# Patient Record
Sex: Female | Born: 1940
Health system: Southern US, Community
[De-identification: ages and names within clinical notes are randomized; demographics above are authoritative.]

## PROBLEM LIST (undated history)

## (undated) DIAGNOSIS — R079 Chest pain, unspecified: Secondary | ICD-10-CM

## (undated) DIAGNOSIS — M1990 Unspecified osteoarthritis, unspecified site: Secondary | ICD-10-CM

## (undated) DIAGNOSIS — G610 Guillain-Barre syndrome: Secondary | ICD-10-CM

## (undated) DIAGNOSIS — Z9889 Other specified postprocedural states: Secondary | ICD-10-CM

## (undated) DIAGNOSIS — I10 Essential (primary) hypertension: Secondary | ICD-10-CM

## (undated) DIAGNOSIS — C801 Malignant (primary) neoplasm, unspecified: Secondary | ICD-10-CM

## (undated) DIAGNOSIS — M4800 Spinal stenosis, site unspecified: Secondary | ICD-10-CM

## (undated) DIAGNOSIS — F419 Anxiety disorder, unspecified: Secondary | ICD-10-CM

## (undated) DIAGNOSIS — K219 Gastro-esophageal reflux disease without esophagitis: Secondary | ICD-10-CM

## (undated) HISTORY — DX: Chest pain, unspecified: R07.9

## (undated) HISTORY — PX: LUMBAR DISC SURGERY: SHX700

## (undated) HISTORY — PX: BACK SURGERY: SHX140

## (undated) HISTORY — PX: ABDOMINAL HYSTERECTOMY: SHX81

## (undated) HISTORY — PX: COLONOSCOPY: SHX174

---

## 1978-05-07 DIAGNOSIS — G610 Guillain-Barre syndrome: Secondary | ICD-10-CM

## 1978-05-07 HISTORY — DX: Guillain-Barre syndrome: G61.0

## 2004-03-17 ENCOUNTER — Emergency Department: Payer: Self-pay | Admitting: Emergency Medicine

## 2004-05-07 HISTORY — PX: AUGMENTATION MAMMAPLASTY: SUR837

## 2004-09-26 ENCOUNTER — Ambulatory Visit: Payer: Self-pay | Admitting: Internal Medicine

## 2008-05-06 ENCOUNTER — Emergency Department: Payer: Self-pay | Admitting: Emergency Medicine

## 2009-08-27 ENCOUNTER — Ambulatory Visit: Payer: Self-pay | Admitting: Internal Medicine

## 2010-08-08 ENCOUNTER — Ambulatory Visit: Payer: Self-pay | Admitting: Internal Medicine

## 2011-08-22 ENCOUNTER — Ambulatory Visit: Payer: Self-pay | Admitting: Internal Medicine

## 2012-05-24 ENCOUNTER — Ambulatory Visit: Payer: Self-pay | Admitting: Physical Medicine and Rehabilitation

## 2012-06-14 ENCOUNTER — Ambulatory Visit: Payer: Self-pay | Admitting: Physical Medicine and Rehabilitation

## 2012-06-14 LAB — CREATININE, SERUM
Creatinine: 0.66 mg/dL (ref 0.60–1.30)
EGFR (Non-African Amer.): >60

## 2012-10-01 ENCOUNTER — Ambulatory Visit: Payer: Self-pay | Admitting: Internal Medicine

## 2013-10-02 ENCOUNTER — Ambulatory Visit: Payer: Self-pay | Admitting: Internal Medicine

## 2013-11-14 DIAGNOSIS — M8580 Other specified disorders of bone density and structure, unspecified site: Secondary | ICD-10-CM | POA: Insufficient documentation

## 2013-11-14 DIAGNOSIS — M1990 Unspecified osteoarthritis, unspecified site: Secondary | ICD-10-CM | POA: Insufficient documentation

## 2013-11-14 DIAGNOSIS — Z780 Asymptomatic menopausal state: Secondary | ICD-10-CM | POA: Insufficient documentation

## 2013-11-14 DIAGNOSIS — R609 Edema, unspecified: Secondary | ICD-10-CM | POA: Insufficient documentation

## 2013-11-14 DIAGNOSIS — G992 Myelopathy in diseases classified elsewhere: Secondary | ICD-10-CM | POA: Insufficient documentation

## 2013-12-13 DIAGNOSIS — G603 Idiopathic progressive neuropathy: Secondary | ICD-10-CM | POA: Insufficient documentation

## 2014-05-07 HISTORY — PX: CERVICAL SPINE SURGERY: SHX589

## 2014-06-23 ENCOUNTER — Ambulatory Visit: Payer: Self-pay | Admitting: Ophthalmology

## 2014-07-14 ENCOUNTER — Ambulatory Visit: Payer: Self-pay | Admitting: Ophthalmology

## 2014-08-20 ENCOUNTER — Other Ambulatory Visit: Payer: Self-pay | Admitting: Internal Medicine

## 2014-08-20 DIAGNOSIS — N6310 Unspecified lump in the right breast, unspecified quadrant: Secondary | ICD-10-CM

## 2014-10-05 ENCOUNTER — Other Ambulatory Visit: Payer: Self-pay | Admitting: Internal Medicine

## 2014-10-05 ENCOUNTER — Ambulatory Visit
Admission: RE | Admit: 2014-10-05 | Discharge: 2014-10-05 | Disposition: A | Discharge status: Home / Self Care | Payer: PPO | Source: Ambulatory Visit | Attending: Internal Medicine | Admitting: Internal Medicine

## 2014-10-05 DIAGNOSIS — Z9882 Breast implant status: Secondary | ICD-10-CM | POA: Diagnosis not present

## 2014-10-05 DIAGNOSIS — R922 Inconclusive mammogram: Secondary | ICD-10-CM | POA: Insufficient documentation

## 2014-10-05 DIAGNOSIS — Z1231 Encounter for screening mammogram for malignant neoplasm of breast: Secondary | ICD-10-CM | POA: Insufficient documentation

## 2014-10-05 DIAGNOSIS — N6310 Unspecified lump in the right breast, unspecified quadrant: Secondary | ICD-10-CM

## 2015-05-23 DIAGNOSIS — G603 Idiopathic progressive neuropathy: Secondary | ICD-10-CM | POA: Diagnosis not present

## 2015-05-30 DIAGNOSIS — I10 Essential (primary) hypertension: Secondary | ICD-10-CM | POA: Diagnosis not present

## 2015-05-30 DIAGNOSIS — R609 Edema, unspecified: Secondary | ICD-10-CM | POA: Diagnosis not present

## 2015-05-30 DIAGNOSIS — Z0000 Encounter for general adult medical examination without abnormal findings: Secondary | ICD-10-CM | POA: Insufficient documentation

## 2015-05-30 DIAGNOSIS — M19049 Primary osteoarthritis, unspecified hand: Secondary | ICD-10-CM | POA: Diagnosis not present

## 2015-06-06 DIAGNOSIS — Z0000 Encounter for general adult medical examination without abnormal findings: Secondary | ICD-10-CM | POA: Diagnosis not present

## 2015-06-17 DIAGNOSIS — L570 Actinic keratosis: Secondary | ICD-10-CM | POA: Diagnosis not present

## 2015-06-17 DIAGNOSIS — L821 Other seborrheic keratosis: Secondary | ICD-10-CM | POA: Diagnosis not present

## 2015-06-17 DIAGNOSIS — Z85828 Personal history of other malignant neoplasm of skin: Secondary | ICD-10-CM | POA: Diagnosis not present

## 2015-06-17 DIAGNOSIS — D225 Melanocytic nevi of trunk: Secondary | ICD-10-CM | POA: Diagnosis not present

## 2015-07-06 DIAGNOSIS — L570 Actinic keratosis: Secondary | ICD-10-CM | POA: Diagnosis not present

## 2015-07-06 DIAGNOSIS — X32XXXA Exposure to sunlight, initial encounter: Secondary | ICD-10-CM | POA: Diagnosis not present

## 2015-07-12 DIAGNOSIS — M79674 Pain in right toe(s): Secondary | ICD-10-CM | POA: Diagnosis not present

## 2015-07-12 DIAGNOSIS — L600 Ingrowing nail: Secondary | ICD-10-CM | POA: Diagnosis not present

## 2015-07-12 DIAGNOSIS — M79675 Pain in left toe(s): Secondary | ICD-10-CM | POA: Diagnosis not present

## 2015-07-19 DIAGNOSIS — R0989 Other specified symptoms and signs involving the circulatory and respiratory systems: Secondary | ICD-10-CM | POA: Diagnosis not present

## 2015-07-19 DIAGNOSIS — R05 Cough: Secondary | ICD-10-CM | POA: Diagnosis not present

## 2015-07-19 DIAGNOSIS — J40 Bronchitis, not specified as acute or chronic: Secondary | ICD-10-CM | POA: Diagnosis not present

## 2015-07-19 DIAGNOSIS — K219 Gastro-esophageal reflux disease without esophagitis: Secondary | ICD-10-CM | POA: Diagnosis not present

## 2015-08-25 ENCOUNTER — Other Ambulatory Visit: Payer: Self-pay | Admitting: Internal Medicine

## 2015-08-25 DIAGNOSIS — Z1231 Encounter for screening mammogram for malignant neoplasm of breast: Secondary | ICD-10-CM

## 2015-08-29 ENCOUNTER — Encounter: Payer: Self-pay | Admitting: Emergency Medicine

## 2015-08-29 ENCOUNTER — Emergency Department
Admission: EM | Admit: 2015-08-29 | Discharge: 2015-08-29 | Disposition: A | Discharge status: Home / Self Care | Payer: PPO | Attending: Emergency Medicine | Admitting: Emergency Medicine

## 2015-08-29 DIAGNOSIS — G610 Guillain-Barre syndrome: Secondary | ICD-10-CM | POA: Insufficient documentation

## 2015-08-29 DIAGNOSIS — Z79899 Other long term (current) drug therapy: Secondary | ICD-10-CM | POA: Insufficient documentation

## 2015-08-29 DIAGNOSIS — I10 Essential (primary) hypertension: Secondary | ICD-10-CM | POA: Insufficient documentation

## 2015-08-29 DIAGNOSIS — M4800 Spinal stenosis, site unspecified: Secondary | ICD-10-CM | POA: Diagnosis not present

## 2015-08-29 HISTORY — DX: Guillain-Barre syndrome: G61.0

## 2015-08-29 HISTORY — DX: Essential (primary) hypertension: I10

## 2015-08-29 HISTORY — DX: Spinal stenosis, site unspecified: M48.00

## 2015-08-29 LAB — BASIC METABOLIC PANEL
ANION GAP: 10 (ref 5–15)
BUN: 16 mg/dL (ref 6–20)
CHLORIDE: 104 mmol/L (ref 101–111)
CO2: 24 mmol/L (ref 22–32)
Calcium: 9.6 mg/dL (ref 8.9–10.3)
Creatinine, Ser: 0.61 mg/dL (ref 0.44–1.00)
GFR calc non Af Amer: >60 mL/min (ref >60)
Glucose, Bld: 105 mg/dL — ABNORMAL HIGH (ref 65–99)
POTASSIUM: 3.7 mmol/L (ref 3.5–5.1)
SODIUM: 138 mmol/L (ref 135–145)

## 2015-08-29 NOTE — ED Notes (Signed)
Verbal report given to [place], [place]

## 2015-08-29 NOTE — Discharge Instructions (Signed)

## 2015-08-29 NOTE — ED Notes (Signed)
Patient reports noticed symptoms around 9pm.  States felt face was flushed and that her cheeks were rosy so she checked her blood pressure and noted it to be up, but thought it would probably go down after she had gotten some sleep.  Patient reports she attempted to sleep, but could not because she felt "jittery" and she rechecked her blood pressure and it was higher.  Patient had been instructed by her physician that she could take 1/2 of her regular blood pressure medicine if this happened.  Patient reports that after a while she took her pressure again and it was still up so she took the other half of her pill.  Patient also reports having a slight headache described as pressure and rated 1/10.  Patient denies chest pain, shortness of breath, dizziness or other symptoms.

## 2015-08-29 NOTE — ED Notes (Addendum)
Pt says around 9pm last night she was feeling shaky so she checked her blood pressure; pressure was up at that time and seems to be getting higher; last check at 0030 was 193/92; currently 191/86 in triage; c/o dull headache; pt takes 5mg  Amlodipine daily; was told by her MD that if she had trouble with her BP she could take another 1/2 tablet; has done that twice in the last 3 hours

## 2015-08-29 NOTE — ED Provider Notes (Signed)
[facility] Emergency Department Provider Note  ____________________________________________  Time seen: Approximately 1:48 AM  I have reviewed the triage vital signs and the nursing notes.   HISTORY  Chief Complaint Hypertension and Headache    HPI [name] Woods is a 75 y.o. female with essential hypertension and several episodes in the past of elevated blood pressure.  She presents tonight by private vehicle for evaluation of hypertension.  She states that she felt flushed earlier tonight and that is often a sign that her blood pressure was up.  When she checked it her blood pressure is been in the [id number] systolic.  She is also had a very mild, 1 out of 10 headache which is common for her as well as well as a generalized feeling of "jittery".  She denies any visual changes, urinary habit changes, chest pain, shortness of breath, fever/chills, abdominal pain, nausea, vomiting, diarrhea.  Says not had any numbness nor weakness in any of her extremities and no difficulty walking.  She describes the hypertension as severe but notes that it is are not getting better since coming to the emergency department.  She did take 2 extra doses of amlodipine 2.5 mg (a total of an extra 5 mg) as per the instructions from her PCP if this were to happen again.   Past Medical History  Diagnosis Date  . Hypertension   . Guillain Barr syndrome ([place])   . Spinal stenosis     There are no active problems to display for this patient.   Past Surgical History  Procedure Laterality Date  . Augmentation mammaplasty  2006  . Back surgery    . Abdominal hysterectomy      Current Outpatient Rx  Name  Route  Sig  Dispense  Refill  . amLODipine (NORVASC) 2.5 MG tablet   Oral   Take 2.5 mg by mouth daily.         Marland Kitchen estradiol (ESTRACE) 1 MG tablet   Oral   Take 1 mg by mouth daily.         . furosemide (LASIX) 20 MG tablet   Oral   Take 20 mg by mouth daily as  needed for edema.          Marland Kitchen losartan (COZAAR) 100 MG tablet   Oral   Take 100 mg by mouth daily.         Marland Kitchen omeprazole (PRILOSEC) 40 MG capsule   Oral   Take 40 mg by mouth daily.           Allergies Review of patient's allergies indicates no known allergies.  History reviewed. No pertinent family history.  Social History Social History  Substance Use Topics  . Smoking status: Never Smoker   . Smokeless tobacco: None  . Alcohol Use: Yes     Comment: occasional    Review of Systems Constitutional: No fever/chills.  Cheeks feel flushed. Eyes: No visual changes. ENT: No sore throat. Cardiovascular: Denies chest pain. Respiratory: Denies shortness of breath. Gastrointestinal: No abdominal pain.  No nausea, no vomiting.  No diarrhea.  No constipation. Genitourinary: Negative for dysuria. Musculoskeletal: Negative for back pain. Skin: Negative for rash. Neurological: Negative for headaches, focal weakness or numbness.  10-point ROS otherwise negative.  ____________________________________________   PHYSICAL EXAM:  VITAL SIGNS: ED Triage Vitals  Enc Vitals Group     BP 08/29/15 0059 191/86 mmHg     Pulse Rate 08/29/15 0059 86     Resp 08/29/15 0059  18     Temp 08/29/15 0059 98.7 F (37.1 C)     Temp Source 08/29/15 0059 Oral     SpO2 08/29/15 0059 97 %     Weight 08/29/15 0059 142 lb (64.411 kg)     Height 08/29/15 0059 5\' 4"  (1.626 m)     Head Cir --      Peak Flow --      Pain Score 08/29/15 0059 1     Pain Loc --      Pain Edu? --      Excl. in [place]? --     Constitutional: Alert and oriented. Well appearing and in no acute distress. Eyes: Conjunctivae are normal. PERRL. EOMI. Head: Atraumatic. Nose: No congestion/rhinnorhea. Mouth/Throat: Mucous membranes are moist.  Oropharynx non-erythematous. Neck: No stridor.  No meningeal signs.   Cardiovascular: Normal rate, regular rhythm. Good peripheral circulation. Grossly normal heart sounds.    Respiratory: Normal respiratory effort.  No retractions. Lungs CTAB. Gastrointestinal: Soft and nontender. No distention.  Musculoskeletal: No lower extremity tenderness nor edema. No gross deformities of extremities. Neurologic:  Normal speech and language. No gross focal neurologic deficits are appreciated.  Skin:  Skin is warm, dry and intact. No rash noted. Psychiatric: Mood and affect are normal. Speech and behavior are normal.  ____________________________________________   LABS (all labs ordered are listed, but only abnormal results are displayed)  Labs Reviewed  BASIC METABOLIC PANEL - Abnormal; Notable for the following:    Glucose, Bld 105 (*)    All other components within normal limits   ____________________________________________  EKG  ED ECG REPORT I, Emmaus Brandi, the attending physician, personally viewed and interpreted this ECG.   Date: 08/29/2015  EKG Time: 01:24  Rate: 77  Rhythm: normal sinus rhythm  Axis: Normal  Intervals:Normal  ST&T Change: Non-specific ST segment / T-wave changes, but no evidence of acute ischemia.  ____________________________________________  RADIOLOGY   No results found.  ____________________________________________   PROCEDURES  Procedure(s) performed: None  Critical Care performed: No ____________________________________________   INITIAL IMPRESSION / ASSESSMENT AND PLAN / ED COURSE  Pertinent labs & imaging results that were available during my care of the patient were reviewed by me and considered in my medical decision making (see chart for details).  I had my usual and customary asymptomatic hypertension discussion with the patient and her husband.  She is fine with holding off on additional treatment and checking a BNP to assess for renal function changes.  She has no signs or symptoms of hypertensive urgency/emergency, and I think she would be appropriate for outpatient follow-up without additional  pharmacological intervention if her renal function is stable.  ----------------------------------------- 3:17 AM on 08/29/2015 -----------------------------------------  Blood pressure is down to about [id number] systolic and the patient remains asymptomatic.  She is comfortable with plan for close outpatient follow-up and no additional pharmacology at this time.  I gave her my usual and customary return precautions.  ____________________________________________  FINAL CLINICAL IMPRESSION(S) / ED DIAGNOSES  Final diagnoses:  Essential hypertension      NEW MEDICATIONS STARTED DURING THIS VISIT:  New Prescriptions   No medications on file      Note:  This document was prepared using Dragon voice recognition software and may include unintentional dictation errors.   Hinda Kehr, MD 08/29/15 [phone]

## 2015-08-31 DIAGNOSIS — I10 Essential (primary) hypertension: Secondary | ICD-10-CM | POA: Diagnosis not present

## 2015-08-31 DIAGNOSIS — Z780 Asymptomatic menopausal state: Secondary | ICD-10-CM | POA: Diagnosis not present

## 2015-10-06 ENCOUNTER — Other Ambulatory Visit: Payer: Self-pay | Admitting: Internal Medicine

## 2015-10-06 ENCOUNTER — Ambulatory Visit
Admission: RE | Admit: 2015-10-06 | Discharge: 2015-10-06 | Disposition: A | Discharge status: Home / Self Care | Payer: PPO | Source: Ambulatory Visit | Attending: Internal Medicine | Admitting: Internal Medicine

## 2015-10-06 DIAGNOSIS — Z1231 Encounter for screening mammogram for malignant neoplasm of breast: Secondary | ICD-10-CM | POA: Diagnosis not present

## 2015-11-21 DIAGNOSIS — I10 Essential (primary) hypertension: Secondary | ICD-10-CM | POA: Diagnosis not present

## 2015-11-25 DIAGNOSIS — R609 Edema, unspecified: Secondary | ICD-10-CM | POA: Diagnosis not present

## 2015-11-25 DIAGNOSIS — I10 Essential (primary) hypertension: Secondary | ICD-10-CM | POA: Diagnosis not present

## 2015-11-25 DIAGNOSIS — K219 Gastro-esophageal reflux disease without esophagitis: Secondary | ICD-10-CM | POA: Diagnosis not present

## 2015-11-25 DIAGNOSIS — Z780 Asymptomatic menopausal state: Secondary | ICD-10-CM | POA: Diagnosis not present

## 2015-12-05 DIAGNOSIS — M4712 Other spondylosis with myelopathy, cervical region: Secondary | ICD-10-CM | POA: Diagnosis not present

## 2015-12-05 DIAGNOSIS — G603 Idiopathic progressive neuropathy: Secondary | ICD-10-CM | POA: Diagnosis not present

## 2015-12-19 DIAGNOSIS — R600 Localized edema: Secondary | ICD-10-CM | POA: Diagnosis not present

## 2015-12-19 DIAGNOSIS — I10 Essential (primary) hypertension: Secondary | ICD-10-CM | POA: Diagnosis not present

## 2016-01-10 DIAGNOSIS — I10 Essential (primary) hypertension: Secondary | ICD-10-CM | POA: Diagnosis not present

## 2016-01-10 DIAGNOSIS — R600 Localized edema: Secondary | ICD-10-CM | POA: Diagnosis not present

## 2016-02-11 DIAGNOSIS — R51 Headache: Secondary | ICD-10-CM | POA: Diagnosis not present

## 2016-02-11 DIAGNOSIS — I10 Essential (primary) hypertension: Secondary | ICD-10-CM | POA: Diagnosis not present

## 2016-02-20 DIAGNOSIS — I10 Essential (primary) hypertension: Secondary | ICD-10-CM | POA: Diagnosis not present

## 2016-03-15 DIAGNOSIS — M48062 Spinal stenosis, lumbar region with neurogenic claudication: Secondary | ICD-10-CM | POA: Diagnosis not present

## 2016-03-20 DIAGNOSIS — Z0000 Encounter for general adult medical examination without abnormal findings: Secondary | ICD-10-CM | POA: Diagnosis not present

## 2016-03-20 DIAGNOSIS — I10 Essential (primary) hypertension: Secondary | ICD-10-CM | POA: Diagnosis not present

## 2016-03-20 DIAGNOSIS — M19049 Primary osteoarthritis, unspecified hand: Secondary | ICD-10-CM | POA: Diagnosis not present

## 2016-03-20 DIAGNOSIS — K219 Gastro-esophageal reflux disease without esophagitis: Secondary | ICD-10-CM | POA: Diagnosis not present

## 2016-03-20 DIAGNOSIS — M8580 Other specified disorders of bone density and structure, unspecified site: Secondary | ICD-10-CM | POA: Diagnosis not present

## 2016-04-03 DIAGNOSIS — D485 Neoplasm of uncertain behavior of skin: Secondary | ICD-10-CM | POA: Diagnosis not present

## 2016-04-04 DIAGNOSIS — C44719 Basal cell carcinoma of skin of left lower limb, including hip: Secondary | ICD-10-CM | POA: Diagnosis not present

## 2016-04-10 DIAGNOSIS — B351 Tinea unguium: Secondary | ICD-10-CM | POA: Diagnosis not present

## 2016-04-10 DIAGNOSIS — M79671 Pain in right foot: Secondary | ICD-10-CM | POA: Diagnosis not present

## 2016-04-10 DIAGNOSIS — M79672 Pain in left foot: Secondary | ICD-10-CM | POA: Diagnosis not present

## 2016-04-16 DIAGNOSIS — M5137 Other intervertebral disc degeneration, lumbosacral region: Secondary | ICD-10-CM | POA: Diagnosis not present

## 2016-04-16 DIAGNOSIS — S32010A Wedge compression fracture of first lumbar vertebra, initial encounter for closed fracture: Secondary | ICD-10-CM | POA: Diagnosis not present

## 2016-04-16 DIAGNOSIS — M48061 Spinal stenosis, lumbar region without neurogenic claudication: Secondary | ICD-10-CM | POA: Diagnosis not present

## 2016-04-16 DIAGNOSIS — M4696 Unspecified inflammatory spondylopathy, lumbar region: Secondary | ICD-10-CM | POA: Diagnosis not present

## 2016-04-16 DIAGNOSIS — M48062 Spinal stenosis, lumbar region with neurogenic claudication: Secondary | ICD-10-CM | POA: Diagnosis not present

## 2016-04-25 DIAGNOSIS — Z6826 Body mass index (BMI) 26.0-26.9, adult: Secondary | ICD-10-CM | POA: Diagnosis not present

## 2016-04-25 DIAGNOSIS — M48062 Spinal stenosis, lumbar region with neurogenic claudication: Secondary | ICD-10-CM | POA: Diagnosis not present

## 2016-04-25 DIAGNOSIS — I10 Essential (primary) hypertension: Secondary | ICD-10-CM | POA: Diagnosis not present

## 2016-05-04 DIAGNOSIS — C44719 Basal cell carcinoma of skin of left lower limb, including hip: Secondary | ICD-10-CM | POA: Diagnosis not present

## 2016-05-22 DIAGNOSIS — R2681 Unsteadiness on feet: Secondary | ICD-10-CM | POA: Diagnosis not present

## 2016-05-22 DIAGNOSIS — M48062 Spinal stenosis, lumbar region with neurogenic claudication: Secondary | ICD-10-CM | POA: Diagnosis not present

## 2016-05-28 DIAGNOSIS — I10 Essential (primary) hypertension: Secondary | ICD-10-CM | POA: Diagnosis not present

## 2016-05-28 DIAGNOSIS — R609 Edema, unspecified: Secondary | ICD-10-CM | POA: Diagnosis not present

## 2016-05-30 DIAGNOSIS — I10 Essential (primary) hypertension: Secondary | ICD-10-CM | POA: Diagnosis not present

## 2016-05-30 DIAGNOSIS — Z0000 Encounter for general adult medical examination without abnormal findings: Secondary | ICD-10-CM | POA: Diagnosis not present

## 2016-05-30 DIAGNOSIS — K219 Gastro-esophageal reflux disease without esophagitis: Secondary | ICD-10-CM | POA: Diagnosis not present

## 2016-06-07 DIAGNOSIS — R2681 Unsteadiness on feet: Secondary | ICD-10-CM | POA: Diagnosis not present

## 2016-06-07 DIAGNOSIS — M48062 Spinal stenosis, lumbar region with neurogenic claudication: Secondary | ICD-10-CM | POA: Diagnosis not present

## 2016-06-12 DIAGNOSIS — Z0000 Encounter for general adult medical examination without abnormal findings: Secondary | ICD-10-CM | POA: Diagnosis not present

## 2016-06-12 DIAGNOSIS — I10 Essential (primary) hypertension: Secondary | ICD-10-CM | POA: Diagnosis not present

## 2016-06-15 DIAGNOSIS — Z08 Encounter for follow-up examination after completed treatment for malignant neoplasm: Secondary | ICD-10-CM | POA: Diagnosis not present

## 2016-06-15 DIAGNOSIS — L821 Other seborrheic keratosis: Secondary | ICD-10-CM | POA: Diagnosis not present

## 2016-06-15 DIAGNOSIS — Z85828 Personal history of other malignant neoplasm of skin: Secondary | ICD-10-CM | POA: Diagnosis not present

## 2016-06-18 DIAGNOSIS — Z961 Presence of intraocular lens: Secondary | ICD-10-CM | POA: Diagnosis not present

## 2016-06-18 DIAGNOSIS — I10 Essential (primary) hypertension: Secondary | ICD-10-CM | POA: Diagnosis not present

## 2016-06-25 DIAGNOSIS — I10 Essential (primary) hypertension: Secondary | ICD-10-CM | POA: Diagnosis not present

## 2016-07-23 DIAGNOSIS — I10 Essential (primary) hypertension: Secondary | ICD-10-CM | POA: Diagnosis not present

## 2016-07-23 DIAGNOSIS — K219 Gastro-esophageal reflux disease without esophagitis: Secondary | ICD-10-CM | POA: Diagnosis not present

## 2016-07-23 DIAGNOSIS — M19049 Primary osteoarthritis, unspecified hand: Secondary | ICD-10-CM | POA: Diagnosis not present

## 2016-07-23 DIAGNOSIS — M8580 Other specified disorders of bone density and structure, unspecified site: Secondary | ICD-10-CM | POA: Diagnosis not present

## 2016-07-23 DIAGNOSIS — R609 Edema, unspecified: Secondary | ICD-10-CM | POA: Diagnosis not present

## 2016-08-01 NOTE — Progress Notes (Signed)
Cardiology Office Note  Date:  08/03/2016   ID:  [name] Woods, [name] Woods [birth year], MRN [id number]  PCP:  Kirk Ruths., MD   Chief Complaint  Patient presents with  . OTHER    Est. Care former [facility] pt c/o elevated BP. Meds reviewed verbally with pt.    HPI:  76 year old woman with history of Hypertension Guillain-Barre syndrome  Chest pain Previously seen by [facility] cardiology in 2017 She presents today by self-referral for evaluation of her labile hypertension  Active, exercises regularly at the [facility]  Previous workup includes Stress echocardiogram 10/28/2014 with normal LV function, no ischemia  Other past medical history includes Spinal stenosis, back pain, Cervical surgery Trouble walking Legs heavy  Very sedentary at baseline, feels over the past several years legs are getting weaker  Reports having chronic leg swelling, worse when she is in the car, and at the end of the day  She is troubled as sometimes in the evenings her blood pressure runs high She gets very anxious, systolic pressure 329, sometimes 200 when she is anxious  EKG on today's visit shows normal sinus rhythm with rate 77 bpm no significant ST or T-wave changes   PMH:   has a past medical history of Guillain Barr syndrome ([place]); Hypertension; and Spinal stenosis.  PSH:    Past Surgical History:  Procedure Laterality Date  . ABDOMINAL HYSTERECTOMY    . AUGMENTATION MAMMAPLASTY  2006  . BACK SURGERY      Current Outpatient Prescriptions  Medication Sig Dispense Refill  . amLODipine (NORVASC) 2.5 MG tablet Take 2.5 mg by mouth daily.    . carvedilol (COREG) 6.25 MG tablet Take 6.25 mg by mouth 2 (two) times daily with a meal.    . estradiol (ESTRACE) 1 MG tablet Take 1 mg by mouth daily.    . hydrALAZINE (APRESOLINE) 25 MG tablet Take 25 mg by mouth 2 (two) times daily.    Marland Kitchen losartan (COZAAR) 100 MG tablet Take 100 mg by mouth daily.    Marland Kitchen omeprazole (PRILOSEC) 40 MG  capsule Take 40 mg by mouth daily.    Marland Kitchen torsemide (DEMADEX) 5 MG tablet Take 5 mg by mouth daily.     No current facility-administered medications for this visit.      Allergies:   Patient has no known allergies.   Social History:  The patient  reports that she has never smoked. She has never used smokeless tobacco. She reports that she drinks alcohol. She reports that she does not use drugs.   Family History:   family history includes Hypertension in her father and mother.    Review of Systems: Review of Systems  Constitutional: Positive for malaise/fatigue.  Respiratory: Negative.   Cardiovascular: Positive for leg swelling.  Gastrointestinal: Negative.   Musculoskeletal: Negative.        Leg weakness  Neurological: Positive for weakness.  Psychiatric/Behavioral: The patient is nervous/anxious.   All other systems reviewed and are negative.    PHYSICAL EXAM: VS:  BP 134/78 (BP Location: Right Leg, Patient Position: Sitting, Cuff Size: Normal)   Pulse 77   Ht 5\' 4"  (1.626 m)   Wt 149 lb 8 oz (67.8 kg)   BMI 25.66 kg/m  , BMI Body mass index is 25.66 kg/m. GEN: Well nourished, well developed, in no acute distress  HEENT: normal  Neck: no JVD, carotid bruits, or masses Cardiac: RRR; no murmurs, rubs, or gallops,Trace minimally pending lower extremity edema  Respiratory:  clear to auscultation bilaterally,  normal work of breathing GI: soft, nontender, nondistended, + BS MS: no deformity or atrophy  Skin: warm and dry, no rash Neuro:  Strength and sensation are intact Psych: euthymic mood, full affect    Recent Labs: 08/29/2015: BUN 16; Creatinine, Ser 0.61; Potassium 3.7; Sodium 138    Lipid Panel No results found for: CHOL, HDL, LDLCALC, TRIG    Wt Readings from Last 3 Encounters:  08/03/16 149 lb 8 oz (67.8 kg)  08/29/15 142 lb (64.4 kg)       ASSESSMENT AND PLAN:  Essential hypertension - Plan: EKG 12-Lead Long discussion concerning her blood  pressure, Recommended she move some of the medications to the evening to avoid labile hypertension  Suggested she follow the algorithm below: AM: Losartan 100 mg daily Coreg 6.25 mg  Torsemide 5 mg in the AM with potassium  PM: 6 pm Coreg 6.25 Amlodipine 2.5 Hydralazine in the PM, 25 mg   Emergency: >170 Take extra hydralazine or two Ok to take extra amlodipine or coreg for emergency  If blood pressure is elevated in the Am, Take extra hydralazine or two right before bed or at 3 Am SOB (shortness of breath) - Plan: EKG 12-Lead  Anxiety - Plan: EKG 12-Lead  Leg edema - Plan: EKG 12-Lead Possibly exacerbated by amlodipine Suspect a component of venous insufficiency Recommended she wear compression hose  Disposition:   F/U  As needed Recommended she call with blood pressure numbers for further management   Total encounter time more than 45 minutes  Greater than 50% was spent in counseling and coordination of care with the patient    Orders Placed This Encounter  Procedures  . EKG 12-Lead     Signed, Esmond Plants, M.D., Ph.D. 08/03/2016  [place], [place]

## 2016-08-03 ENCOUNTER — Encounter: Payer: Self-pay | Admitting: Cardiovascular Disease

## 2016-08-03 ENCOUNTER — Ambulatory Visit (INDEPENDENT_AMBULATORY_CARE_PROVIDER_SITE_OTHER): Payer: PPO | Admitting: Cardiovascular Disease

## 2016-08-03 VITALS — BP 134/78 | HR 77 | Ht 64.0 in | Wt 149.5 lb

## 2016-08-03 DIAGNOSIS — F419 Anxiety disorder, unspecified: Secondary | ICD-10-CM | POA: Diagnosis not present

## 2016-08-03 DIAGNOSIS — R600 Localized edema: Secondary | ICD-10-CM | POA: Diagnosis not present

## 2016-08-03 DIAGNOSIS — I10 Essential (primary) hypertension: Secondary | ICD-10-CM | POA: Insufficient documentation

## 2016-08-03 DIAGNOSIS — R0602 Shortness of breath: Secondary | ICD-10-CM | POA: Diagnosis not present

## 2016-08-03 NOTE — Patient Instructions (Addendum)
Compression hose   Medication Instructions:   AM: Losartan 100 mg daily Coreg 6.25 mg  Torsemide 5 mg in the AM with potassium  PM: 6 pm Coreg 6.25 Amlodipine 2.5 Hydralazine in the PM, 25 mg   Emergency: >170 Take extra hydralazine or two Ok to take extra amlodipine or coreg for emergency  If blood pressure is elevated in the Am, Take extra hydralazine or two right before bed or at 3 Am  Labwork:  No new labs needed  Testing/Procedures:  No further testing at this time   I recommend watching educational videos on topics of interest to you at:       [email or web address]  Enter code: [email or web address]    Follow-Up: It was a pleasure seeing you in the office today. Please call us if you have new issues that need to be addressed before your next appt.  [phone]  Your physician wants you to follow-up in:  As needed  If you need a refill on your cardiac medications before your next appointment, please call your pharmacy.

## 2016-08-28 DIAGNOSIS — L600 Ingrowing nail: Secondary | ICD-10-CM | POA: Diagnosis not present

## 2016-08-28 DIAGNOSIS — M79675 Pain in left toe(s): Secondary | ICD-10-CM | POA: Diagnosis not present

## 2016-08-28 DIAGNOSIS — M79674 Pain in right toe(s): Secondary | ICD-10-CM | POA: Diagnosis not present

## 2016-08-31 ENCOUNTER — Other Ambulatory Visit: Payer: Self-pay | Admitting: Internal Medicine

## 2016-08-31 DIAGNOSIS — Z1231 Encounter for screening mammogram for malignant neoplasm of breast: Secondary | ICD-10-CM

## 2016-09-10 DIAGNOSIS — I10 Essential (primary) hypertension: Secondary | ICD-10-CM | POA: Diagnosis not present

## 2016-09-10 DIAGNOSIS — M1712 Unilateral primary osteoarthritis, left knee: Secondary | ICD-10-CM | POA: Diagnosis not present

## 2016-09-10 DIAGNOSIS — M25562 Pain in left knee: Secondary | ICD-10-CM | POA: Diagnosis not present

## 2016-09-20 DIAGNOSIS — M48062 Spinal stenosis, lumbar region with neurogenic claudication: Secondary | ICD-10-CM | POA: Diagnosis not present

## 2016-09-20 DIAGNOSIS — I10 Essential (primary) hypertension: Secondary | ICD-10-CM | POA: Diagnosis not present

## 2016-09-20 DIAGNOSIS — Z6825 Body mass index (BMI) 25.0-25.9, adult: Secondary | ICD-10-CM | POA: Diagnosis not present

## 2016-09-28 ENCOUNTER — Telehealth: Payer: Self-pay | Admitting: Cardiovascular Disease

## 2016-09-28 MED ORDER — AMLODIPINE BESYLATE 2.5 MG PO TABS: 5.0000 mg | ORAL_TABLET | ORAL | 3 refills | Status: DC

## 2016-09-28 MED ORDER — HYDRALAZINE HCL 25 MG PO TABS: 50.0000 mg | ORAL_TABLET | ORAL | 3 refills | Status: DC

## 2016-09-28 NOTE — Telephone Encounter (Signed)
Instructed patient to go to emergency room if she continues to have elevated blood pressures and develops any other symptoms such as headaches or blurred vision. She denies any of these at this time and she verbalized understanding with no further questions.

## 2016-09-28 NOTE — Telephone Encounter (Signed)
Spoke with patient at length and she reports that last Saturday night her blood pressure was systolic over 665. Reviewed medication instructions with her in detail and she reports that she took extra hydralazine and extra carvedilol for that event. She states that it eventually came down but she continues to have these episodes of elevated pressures in the evening. Reviewed with her to try and take the hydralazine 2 tablets when blood pressures are elevated and if that does not help then to take the extra amlodipine as directed by Dr. Rockey Situ. She verbalized understanding and just wanted to make sure that it would be safe to take the 2 tablets at one time along with the other medications. Let her know that these were instructions he recommended and that if she continued to have elevated pressures to please give [place] a call back and we may need to review other options. She reports that she is keeping a daily log of her readings and others look good except for 2 nights where it was elevated. Instructed her to continue the log and to give [place] a call back if she finds that she is having to take these medications more frequently. She was very appreciative for my call and had no further questions at this time.

## 2016-09-28 NOTE — Telephone Encounter (Signed)
Pt calling stating she is on vacation at [place]   Still having issues with BP  Took her medication and the BP did get better   This morning it was 170 Yesterday she wasn't able to get it down either  Would like some advise on this  Please advise

## 2016-09-28 NOTE — Telephone Encounter (Signed)
Spoke with patient and she reports her blood pressure this morning was 176/83, 164/80's,  117/80's, and she just has not felt well. Had her check blood pressure while she was on the phone with me and it was 175/89. She has taken 2 tablets of hydralazine around 2:30pm. She is concerned and wanted to know what else she can do. Will route to Dr. Rockey Situ for his recommendations.

## 2016-09-28 NOTE — Telephone Encounter (Signed)
Instructed her to go ahead and take extra dose of amlodipine for now and to continue monitoring her blood pressures.

## 2016-09-28 NOTE — Telephone Encounter (Signed)
Spoke with patient and reviewed Dr. Donivan Scull recommendations for increasing medication. She is concerned about running out of her medications and sent in updated prescription for her to have enough. Instructed her to increase amlodipine to 5 mg at bedtime and hydralazine to 50 mg at bedtime with additional doses as needed during the day for elevated blood pressures. She verbalized understanding of all instructions, agreement with plan, and had no further questions at this time.

## 2016-09-28 NOTE — Telephone Encounter (Signed)
Consider increasing the evening amlodipine up to 5 mg Also consider increasing hydralazine in the evening up to 50 mg For high blood pressure take extra hydralazine 1-2 pills and/or amlodipine 2.5 or 5  AM: Losartan 100 mg daily Coreg 6.25 mg  Torsemide 5 mg in the AM with potassium  PM: 6 pm Coreg 6.25 Amlodipine 5 Hydralazine in the PM, 50 mg   Emergency: >170 Take extra hydralazine or two Ok to take extra amlodipine or coreg for emergency

## 2016-09-28 NOTE — Telephone Encounter (Signed)
Pt states she has taken 2 hydralazine at 10:30, and she just took her BP, and it is up to 180/87 HR 91. Please call

## 2016-10-16 ENCOUNTER — Ambulatory Visit
Admission: RE | Admit: 2016-10-16 | Discharge: 2016-10-16 | Disposition: A | Discharge status: Home / Self Care | Payer: PPO | Source: Ambulatory Visit | Attending: Internal Medicine | Admitting: Internal Medicine

## 2016-10-16 DIAGNOSIS — I10 Essential (primary) hypertension: Secondary | ICD-10-CM | POA: Diagnosis not present

## 2016-10-16 DIAGNOSIS — Z1231 Encounter for screening mammogram for malignant neoplasm of breast: Secondary | ICD-10-CM | POA: Diagnosis not present

## 2016-10-16 DIAGNOSIS — R609 Edema, unspecified: Secondary | ICD-10-CM | POA: Diagnosis not present

## 2016-10-16 DIAGNOSIS — K219 Gastro-esophageal reflux disease without esophagitis: Secondary | ICD-10-CM | POA: Diagnosis not present

## 2016-10-23 DIAGNOSIS — I10 Essential (primary) hypertension: Secondary | ICD-10-CM | POA: Diagnosis not present

## 2016-10-23 DIAGNOSIS — M19049 Primary osteoarthritis, unspecified hand: Secondary | ICD-10-CM | POA: Diagnosis not present

## 2016-10-23 DIAGNOSIS — M8580 Other specified disorders of bone density and structure, unspecified site: Secondary | ICD-10-CM | POA: Diagnosis not present

## 2016-10-23 DIAGNOSIS — G603 Idiopathic progressive neuropathy: Secondary | ICD-10-CM | POA: Diagnosis not present

## 2016-10-23 DIAGNOSIS — K219 Gastro-esophageal reflux disease without esophagitis: Secondary | ICD-10-CM | POA: Diagnosis not present

## 2016-10-23 DIAGNOSIS — R600 Localized edema: Secondary | ICD-10-CM | POA: Diagnosis not present

## 2016-11-01 ENCOUNTER — Telehealth: Payer: Self-pay | Admitting: Cardiovascular Disease

## 2016-11-01 NOTE — Telephone Encounter (Signed)
Pt c/o BP issue: STAT if pt c/o blurred vision, one-sided weakness or slurred speech  1. What are your last 5 BP readings?  Last night 193/104 3 am this morning- 181/101 after medication This morning -165/92 HR 90  2. Are you having any other symptoms (ex. Dizziness, headache, blurred vision, passed out)? No toher symptoms. States she can tell "I am not quite right"  3. What is your BP issue? elevated

## 2016-11-01 NOTE — Telephone Encounter (Signed)
Patient states that she had a panic attack last night and her blood pressure was extremely elevated. She reports that it was 191/94 at midnight and then at [facility] it was 181/101. She reports that she took 1 tablet of hydralazine and then again at 3 am she took another dose. She reports that her blood pressure was normal this morning after her daily medications. Reviewed medications with her and that it is listed for her to take 2 tablets of hydralazine as needed for elevated blood pressures. She was so appreciative for the review of her medications and instructions. Instructed her to continue monitoring blood pressures and to call back if any further concerns.

## 2016-11-22 ENCOUNTER — Telehealth: Payer: Self-pay | Admitting: Cardiovascular Disease

## 2016-11-22 NOTE — Telephone Encounter (Signed)
Pt c/o BP issue: STAT if pt c/o blurred vision, one-sided weakness or slurred speech  1. What are your last 5 BP readings? 195/99  430 today trending high all day   2. Are you having any other symptoms (ex. Dizziness, headache, blurred vision, passed out)?    Headache not feeling well frequent urination   3. What is your BP issue?  High readings not feeling good Took emergency medications but bp is not improving    Patient out of town now  In [employer]   [phone]

## 2016-11-22 NOTE — Telephone Encounter (Signed)
Spoke w/ pt.  She reports that she is at her house in [place]. She usually [place] an issue w/ [place] but hydralazine typically helps w/ this. She reports her BP this am @ 9 was 175/85, she took her regular am meds: coreg 6.25mg , losartan 100mg , torsemide 5 mg w/ K+. 11:00 SBP 169, she took a hydralazine 25 mg & coreg 6.25 mg 3:00  183/83, she took hydralazine 25 mg & coreg 6.25 mg.  4:15  195/99, she took an amlodipine 5 mg. She reports that she does not normally take her BP this often, but she has felt strange since she woke up this am.  Advised her that she may take 2 hydralazine at a time for a total of 50 mg if her BP is elevated > 170. She states that she is hesitant to keep taking meds if they are not working and would like to know if Dr. Rockey Situ has any other recommendations.  Advised her that I will make him aware and call her back w/ his recommendation.

## 2016-11-22 NOTE — Telephone Encounter (Signed)
If she is under stress, causing BP to go up, It will take large amounts of any medication to work Unclear why [place]. Is there an anxiety component pushing it up? I would agree, take extra hydralazine and or extra amlodipine Few other choices If anxiety, would call PMD for xanax PRN

## 2016-11-22 NOTE — Telephone Encounter (Signed)
pati

## 2016-11-23 NOTE — Telephone Encounter (Signed)
S/w pt who reports she is not stressed and unsure why BP is elevated. Reports BP improved yesterday and was 150/75 this AM, 130s later in the day.  She is agreeable to continue to monitor BP and  take extra hydralazine and amlodipine when needed. She would like an appointment with Dr. Rockey Situ and asks we call her Monday at home number, [phone] Routed to scheduling

## 2016-11-26 ENCOUNTER — Ambulatory Visit (INDEPENDENT_AMBULATORY_CARE_PROVIDER_SITE_OTHER): Payer: PPO | Admitting: Cardiovascular Disease

## 2016-11-26 ENCOUNTER — Encounter: Payer: Self-pay | Admitting: Cardiovascular Disease

## 2016-11-26 VITALS — BP 134/72 | HR 82 | Ht 64.0 in | Wt 147.2 lb

## 2016-11-26 DIAGNOSIS — R600 Localized edema: Secondary | ICD-10-CM | POA: Diagnosis not present

## 2016-11-26 DIAGNOSIS — R0602 Shortness of breath: Secondary | ICD-10-CM | POA: Diagnosis not present

## 2016-11-26 DIAGNOSIS — I10 Essential (primary) hypertension: Secondary | ICD-10-CM | POA: Diagnosis not present

## 2016-11-26 MED ORDER — NITROGLYCERIN 0.4 MG SL SUBL: 0.4000 mg | SUBLINGUAL_TABLET | SUBLINGUAL | 6 refills | Status: DC | PRN

## 2016-11-26 NOTE — Progress Notes (Signed)
Cardiology Office Note  Date:  11/26/2016   ID:  [facility] [name] Woods, [name] Woods 10/28/1940, MRN [id number]  PCP:  Kirk Ruths, MD   Chief Complaint  Patient presents with  . other    Elevated BP. Meds reviewed verbally with pt.    HPI:  76 year old woman with history of Labile Hypertension Anxiety Guillain-Barre syndrome  Chest pain Previously seen by [facility] cardiology in 2017 She presents for follow up of her labile hypertension  Active at baseline, On today's visit she is concerned about periodic systolic pressure 308 up to 200 In general pressures on her current regimen running 130 up to 150 She is very happy in general Active, exercises regularly at the [facility] that she needs an emergency pill for the blood pressure spikes. May be associated with stress/anxiety such as hosting people patter house Trace leg swelling  Previous workup includes Stress echocardiogram 10/28/2014 with normal LV function, no ischemia  Other past medical history includes Spinal stenosis, back pain, Cervical surgery Trouble walking Legs heavy  Very sedentary at baseline, feels over the past several years legs are getting weaker  Reports having chronic leg swelling, worse when she is in the car, and at the end of the day  She is troubled as sometimes in the evenings her blood pressure runs high She gets very anxious, systolic pressure 657, sometimes 200 when she is anxious  EKG on today's visit shows normal sinus rhythm with rate 77 bpm no significant ST or T-wave changes   PMH:   has a past medical history of Guillain Barr syndrome ([place]); Hypertension; and Spinal stenosis.  PSH:    Past Surgical History:  Procedure Laterality Date  . ABDOMINAL HYSTERECTOMY    . AUGMENTATION MAMMAPLASTY  2006  . BACK SURGERY      Current Outpatient Prescriptions  Medication Sig Dispense Refill  . amLODipine (NORVASC) 2.5 MG tablet Take 2 tablets (5 mg total) by mouth as directed. Take  2 tablets at bedtime and take extra dose as needed during the day. 180 tablet 3  . carvedilol (COREG) 6.25 MG tablet Take 6.25 mg by mouth 2 (two) times daily with a meal.    . estradiol (ESTRACE) 1 MG tablet Take 1 mg by mouth daily.    . hydrALAZINE (APRESOLINE) 25 MG tablet Take 2 tablets (50 mg total) by mouth as directed. Take 2 tablets at bedtime and 1-2 pills during the day for elevated BP 180 tablet 3  . losartan (COZAAR) 100 MG tablet Take 100 mg by mouth daily.    Marland Kitchen omeprazole (PRILOSEC) 40 MG capsule Take 40 mg by mouth daily.    Marland Kitchen torsemide (DEMADEX) 5 MG tablet Take 5 mg by mouth daily.     No current facility-administered medications for this visit.      Allergies:   Patient has no known allergies.   Social History:  The patient  reports that she has never smoked. She has never used smokeless tobacco. She reports that she drinks alcohol. She reports that she does not use drugs.   Family History:   family history includes Hypertension in her father and mother.    Review of Systems: Review of Systems  Constitutional: Negative.   Respiratory: Negative.   Cardiovascular: Positive for leg swelling.  Gastrointestinal: Negative.   Musculoskeletal: Negative.        Leg weakness  Psychiatric/Behavioral: The patient is nervous/anxious.   All other systems reviewed and are negative.    PHYSICAL EXAM: VS:  BP 134/72 (BP Location: Left Arm, Patient Position: Sitting, Cuff Size: Normal)   Pulse 82   Ht 5\' 4"  (1.626 m)   Wt 147 lb 4 oz (66.8 kg)   BMI 25.28 kg/m  , BMI Body mass index is 25.28 kg/m. GEN: Well nourished, well developed, in no acute distress  HEENT: normal  Neck: no JVD, carotid bruits, or masses Cardiac: RRR; no murmurs, rubs, or gallops,Trace minimally pending lower extremity edema  Respiratory:  clear to auscultation bilaterally, normal work of breathing GI: soft, nontender, nondistended, + BS MS: no deformity or atrophy  Skin: warm and dry, no  rash Neuro:  Strength and sensation are intact Psych: euthymic mood, full affect    Recent Labs: No results found for requested labs within last 8760 hours.    Lipid Panel No results found for: CHOL, HDL, LDLCALC, TRIG    Wt Readings from Last 3 Encounters:  11/26/16 147 lb 4 oz (66.8 kg)  08/03/16 149 lb 8 oz (67.8 kg)  08/29/15 142 lb (64.4 kg)       ASSESSMENT AND PLAN:  Essential hypertension - Plan: EKG 12-Lead Long discussion concerning her blood pressure, She is happy to follow the algorithm below AM: Losartan 100 mg daily Coreg 6.25 mg  Torsemide 5 mg in the AM with potassium  PM: 6 pm Coreg 6.25 Amlodipine 2.5 Hydralazine in the PM, 25 mg   Emergency: >170 We will give her nitroglycerin sublingual for blood pressure spike, okay to repeat 1 Take extra hydralazine or two if needed Ok to take extra  coreg for emergency. Trying to avoid amlodipine given leg swelling that could be taken for emergency  If blood pressure is elevated in the Am, Take extra hydralazine or two right before bed or at 3 Am SOB (shortness of breath) - Plan: EKG 12-Lead  Anxiety - Plan: EKG 12-Lead Recommended she discuss this with Dr. Ouida Sills. May need Xanax or Ativan  Leg edema - Plan: EKG 12-Lead Possibly exacerbated by amlodipine We'll try to keep the dose low Suspect a component of venous insufficiency She now has compression hose  Disposition:   F/U  As needed Recommended she call with blood pressure numbers for further management   Total encounter time more than 25 minutes  Greater than 50% was spent in counseling and coordination of care with the patient    Orders Placed This Encounter  Procedures  . EKG 12-Lead     Signed, Esmond Plants, M.D., Ph.D. 11/26/2016  [place], [place]

## 2016-11-26 NOTE — Patient Instructions (Addendum)
Medication Instructions:   AM: Losartan 100 mg daily Coreg 6.25 mg  Torsemide 5 mg in the AM with potassium  PM: 6 pm Coreg 6.25 Amlodipine 2.5 Hydralazine in the PM, 25 mg   Emergency: >170 Take NTG SL as needed, Wait 10 to 15 minutes before taking another Take extra hydralazine or two (lasts 6 hours) Ok to take extra coreg for emergency   Labwork:  No new labs needed  Testing/Procedures:  No further testing at this time   Follow-Up: It was a pleasure seeing you in the office today. Please call us if you have new issues that need to be addressed before your next appt.  [phone]  Your physician wants you to follow-up in: 12 months.  You will receive a reminder letter in the mail two months in advance. If you don't receive a letter, please call our office to schedule the follow-up appointment.  If you need a refill on your cardiac medications before your next appointment, please call your pharmacy.

## 2016-12-12 NOTE — Telephone Encounter (Signed)
Left voicemail message to call back  

## 2016-12-12 NOTE — Telephone Encounter (Signed)
Spoke with patient at length and her blood pressures continue to be elevated. She reports most recent was 167/110. Instructed her to take hydralazine 3 times a day until her blood pressures come back down to normal and to continue monitoring those readings. Advised her to give [place] a call if her blood pressures remain elevated or if they get low. She verbalized understanding of our conversation, agreement with plan, and had no further questions at this time.

## 2016-12-12 NOTE — Telephone Encounter (Addendum)
Patient continues to have elevated bp and reports she has taken 2 nitro and it has come down from 171 and 191 to now 343 systolic  Patient wants to come in for ov.  Schedule next available 8/17 with Ryan at 2 .  Added to waitlist .

## 2016-12-14 ENCOUNTER — Telehealth: Payer: Self-pay | Admitting: Cardiovascular Disease

## 2016-12-14 ENCOUNTER — Encounter: Payer: Self-pay | Admitting: Nurse Practitioner

## 2016-12-14 ENCOUNTER — Ambulatory Visit (INDEPENDENT_AMBULATORY_CARE_PROVIDER_SITE_OTHER): Payer: PPO | Admitting: Nurse Practitioner

## 2016-12-14 VITALS — BP 158/92 | HR 73 | Ht 64.0 in | Wt 145.5 lb

## 2016-12-14 DIAGNOSIS — I10 Essential (primary) hypertension: Secondary | ICD-10-CM

## 2016-12-14 DIAGNOSIS — R0789 Other chest pain: Secondary | ICD-10-CM | POA: Diagnosis not present

## 2016-12-14 MED ORDER — HYDRALAZINE HCL 25 MG PO TABS: 25.0000 mg | ORAL_TABLET | Freq: Three times a day (TID) | ORAL | 3 refills | Status: DC

## 2016-12-14 NOTE — Telephone Encounter (Signed)
Patient seen today in clinic with NP. See office visit note

## 2016-12-14 NOTE — Telephone Encounter (Signed)
Wednesday she went to dentist and became flushed, dizzy, and some tinge of chest pain. Blood pressure at 170's and then went home and took her medications and it went down to 167's. Last night it was normal and then at 9 pm and suddenly she had those twinges of pain and became flushed 193/90. Took Nitro and it went down to 152/82 but did develop a dull headache. This morning it went to 175. Blood pressure now is 164/82 and she has taken all morning meds and hydralazine. Patient states that she was supposed to go out to town but is not able to with her elevated blood pressures. Spoke with patient at length and offered her appointment at 1:30 PM with our NP here in the office. She was very appreciative and just didn't want to go to ED if we could evaluate her here in the office. She verbalized understanding of our conversation, agreement with plan, and had no further questions at this time.l

## 2016-12-14 NOTE — Patient Instructions (Signed)
Medication Instructions:  Your physician has recommended you make the following change in your medication:  1. INCREASE Hydralazine 25 mg three times daily  Please call next week with your blood pressure readings.   Follow-Up: Your physician recommends that you schedule a follow-up appointment in: 2 weeks with Ignacia Bayley NP or Christell Faith PA  It was a pleasure seeing you today here in the office. Please do not hesitate to give [place] a call back if you have any further questions. [place], BSN

## 2016-12-14 NOTE — Telephone Encounter (Signed)
New message    Pt c/o BP issue: STAT if pt c/o blurred vision, one-sided weakness or slurred speech  1. What are your last 5 BP readings?  193/90   2. Are you having any other symptoms (ex. Dizziness, headache, blurred vision, passed out)?  Dizziness and flushed , chest pain   3. What is your BP issue?  bp spiked , and she started getting chest pains for the last 3 days   Pt c/o of Chest Pain: STAT if CP now or developed within 24 hours  1. Are you having CP right now?  No not since last night at 9pm  2. Are you experiencing any other symptoms (ex. SOB, nausea, vomiting, sweating)?  no  3. How long have you been experiencing CP?  Since wed, has had 2 episodes   4. Is your CP continuous or coming and going? Comes and goes   5. Have you taken Nitroglycerin? no ?

## 2016-12-14 NOTE — Progress Notes (Signed)
Office Visit    Patient Name: [name] Woods Date of Encounter: 12/14/2016  Primary Care Provider:  Kirk Ruths, MD Primary Cardiologist:  Johnny Bridge, MD   Chief Complaint    76 y/o ? with a h/o labile HTN, Guillain Barre, chest pain, and spinal stenosis, who presents today for f/u related to elevated blood pressures.  Past Medical History    Past Medical History:  Diagnosis Date  . Chest pain    a. 10/2014 St Echo: nl LV fxn, no wma/ischemia.  Sharlyn Bologna syndrome ([place])   . Hypertension   . Spinal stenosis    Past Surgical History:  Procedure Laterality Date  . ABDOMINAL HYSTERECTOMY    . AUGMENTATION MAMMAPLASTY  2006  . BACK SURGERY      Allergies  No Known Allergies  History of Present Illness    76 y/o ? with the above PMH including HTN and chest pain with prior neg st echo in 2016. She also has a h/o Academic librarian.  BP has been labile over the years and she was last seen by Dr. Rockey Situ in July with a plan to take coreg 6.25 bid, losartan 100 qam, amlodipine 5 q dinner, and hydralazine 50 q dinner.  She will often have to take an additional hydralazine 25 mg at some point during the day for bp spikes. She has also taken prn sl ntg for bp spikes, and has noted that although it is effective, that effect is very short.  Blood pressures will sometimes spike over 200 mmHg.  That might happen at any time during the day.  She sometimes notes a flushed feeling when bp is up and on at least one occasion, she noted a sharp/fleeting discomfort under her right breast that lasted < 20 seconds.  Due to BP elevations and fluctuations, she contacted our office and was added to my schedule today.  She currently has no complaints.  BP is 158/92.  Home Medications    Prior to Admission medications   Medication Sig Start Date End Date Taking? Authorizing Provider  amLODipine (NORVASC) 2.5 MG tablet Take 2 tablets (5 mg total) by mouth as directed. Take 2 tablets  at bedtime and take extra dose as needed during the day. 09/28/16 12/27/16 Yes Gollan, Kathlene November, MD  carvedilol (COREG) 6.25 MG tablet Take 6.25 mg by mouth 2 (two) times daily with a meal.   Yes [provider]  estradiol (ESTRACE) 1 MG tablet Take 1 mg by mouth daily.   Yes [provider]  hydrALAZINE (APRESOLINE) 25 MG tablet Take 2 tablets (50 mg total) by mouth as directed. Take 2 tablets at bedtime and 1-2 pills during the day for elevated BP 09/28/16  Yes Gollan, Kathlene November, MD  losartan (COZAAR) 100 MG tablet Take 100 mg by mouth daily. 08/04/15  Yes [provider]  nitroGLYCERIN (NITROSTAT) 0.4 MG SL tablet Place 1 tablet (0.4 mg total) under the tongue every 5 (five) minutes as needed for chest pain. 11/26/16  Yes Minna Merritts, MD  omeprazole (PRILOSEC) 40 MG capsule Take 40 mg by mouth daily. 08/24/15  Yes [provider]  torsemide (DEMADEX) 5 MG tablet Take 5 mg by mouth daily.   Yes [provider]    Review of Systems    Some flushing and fleeting discomfort under left breast in the setting of elevated blood pressures.  She denies palpitations, dyspnea, pnd, orthopnea, n, v, dizziness, syncope, weight gain, or early  satiety.  She previously had lower ext swelling but this has been better on a lower dose of amlodipine.   All other systems reviewed and are otherwise negative except as noted above.  Physical Exam    VS:  BP (!) 158/92 (BP Location: Left Arm, Patient Position: Sitting, Cuff Size: Normal)   Pulse 73   Ht 5\' 4"  (1.626 m)   Wt 145 lb 8 oz (66 kg)   BMI 24.98 kg/m  , BMI Body mass index is 24.98 kg/m. GEN: Well nourished, well developed, in no acute distress.  HEENT: normal.  Neck: Supple, no JVD, carotid bruits, or masses. Cardiac: RRR, no murmurs, rubs, or gallops. No clubbing, cyanosis, edema.  Radials/DP/PT 2+ and equal bilaterally.  Respiratory:  Respirations regular and unlabored, clear to auscultation  bilaterally. GI: Soft, nontender, nondistended, BS + x 4. MS: no deformity or atrophy. Skin: warm and dry, no rash. Neuro:  Strength and sensation are intact. Psych: Normal affect.  Accessory Clinical Findings    ECG - RSR, 73, leftward axis, LAE.  No acute st/t changes.  Assessment & Plan    1.  Essential Hypertension:  Pt with h/o labile hypertension.  Since her last office visit, she has been having spikes in blood pressure, for which she takes either prn hydralazine 25 mg, sl ntg, or an extra 2.5 mg of amlodipine.  She says that having so many options for prn meds can be confusing.  She wishes bp wouldn't spike so much.  I suspect that she will benefit from more consistent dosing of her hydralazine to allow for all day coverage and potentially prevent some of the rebound hypertension that she has been experiencing.  As such, I have asked her to take hydralazine 25 mg TID instead of 50 @ dinner and 25 prn.  She will otw remain on carvedilol 6.25 BID, losartan 100 qam, and amlodipine 5 mg q dinner.  We discussed how the initial goal will be more consistent blood pressures and that if she continues to run high, she will call us next week and we can consider simply moving to hydralazine 50 TID.  She may still take an additional hydralazine 25 mg prn SBP > 170 mmHg but hopefully spikes will be less frequent or profound with regularly scheduled dosing.  2.  Atypical chest pain:  Pt with a fleeting episode of discomfort under left breast in the setting of elevated bp the other day.  This resolved within 20 secs.  She has not had any exertional c/p or change in activity tolerance.  Neg St echo in 2016.  ECG ok today.  No plan for repeat ischemic eval @ this time.  3.  Disposition: f/u in 2 wks. She will call with blood pressure readings next week.   Murray Hodgkins, NP 12/14/2016, 2:16 PM

## 2016-12-21 ENCOUNTER — Ambulatory Visit: Payer: PPO | Admitting: Physician Assistant

## 2016-12-27 ENCOUNTER — Telehealth: Payer: Self-pay | Admitting: Cardiovascular Disease

## 2016-12-27 NOTE — Telephone Encounter (Signed)
These are the blood pressure readings that we requested at her last office visit with Ignacia Bayley NP. Routing this information over to him for review.

## 2016-12-27 NOTE — Telephone Encounter (Signed)
Pt calling to give her BP readings.  12/15/16 104-157/81 12/16/16 150/72 12/17/16 130/60 12/18/16 140/77 12/19/16 122/71 12/20/16 130/67 12/21/16 120/60 12/22/16 140/70 12/23/16 140/75 12/24/16 150-182/90 12/25/16 145/74 12/26/16 130/70

## 2016-12-28 MED ORDER — HYDRALAZINE HCL 25 MG PO TABS: 37.5000 mg | ORAL_TABLET | Freq: Three times a day (TID) | ORAL | 3 refills | Status: DC

## 2016-12-28 NOTE — Telephone Encounter (Signed)
Reviewed recommendations with patient and she was agreeable with no further questions or concerns at this time. Instructed her to continue monitoring her blood pressures and give [place] a call if they consistently remain 140/80 or higher. She was appreciative for the call and was agreeable with plan.

## 2016-12-28 NOTE — Telephone Encounter (Signed)
BP is somewhat variable with some nl numbers mixed with higher pressures on some days.  Only one day with really out of range pressure (8/20 - 182/90).  Let's have her increase hydralazine to 37.5 mg TID.  I suspect that that will do the trick.  This will be 1.5, 25mg  tabs TID, since there is not 37.5 mg tab.

## 2017-01-01 ENCOUNTER — Ambulatory Visit (INDEPENDENT_AMBULATORY_CARE_PROVIDER_SITE_OTHER): Payer: PPO | Admitting: Nurse Practitioner

## 2017-01-01 ENCOUNTER — Encounter: Payer: Self-pay | Admitting: Nurse Practitioner

## 2017-01-01 VITALS — BP 128/78 | HR 79 | Ht 64.0 in | Wt 147.5 lb

## 2017-01-01 DIAGNOSIS — I10 Essential (primary) hypertension: Secondary | ICD-10-CM

## 2017-01-01 DIAGNOSIS — R0789 Other chest pain: Secondary | ICD-10-CM

## 2017-01-01 NOTE — Patient Instructions (Signed)
Medication Instructions:  Your physician recommends that you continue on your current medications as directed. Please refer to the Current Medication list given to you today.   Labwork: none  Testing/Procedures: none  Follow-Up: Your physician wants you to follow-up in: [place]. You will receive a reminder letter in the mail two months in advance. If you don't receive a letter, please call our office to schedule the follow-up appointment.  If you need a refill on your cardiac medications before your next appointment, please call your pharmacy.

## 2017-01-01 NOTE — Progress Notes (Signed)
Office Visit    Patient Name: [name] Woods Date of Encounter: 01/01/2017  Primary Care Provider:  Kirk Ruths, MD Primary Cardiologist:  Johnny Bridge, MD   Chief Complaint    76 year old ? With a history of labile hypertension, Guillain Barr, chest pain, and spinal stenosis, who presents for follow-up of blood pressures.  Past Medical History    Past Medical History:  Diagnosis Date  . Chest pain    a. 10/2014 St Echo: nl LV fxn, no wma/ischemia.  Sharlyn Bologna syndrome ([place])   . Hypertension   . Spinal stenosis    Past Surgical History:  Procedure Laterality Date  . ABDOMINAL HYSTERECTOMY    . AUGMENTATION MAMMAPLASTY  2006  . BACK SURGERY      Allergies  No Known Allergies  History of Present Illness    76 year old ? With the above past medical history including hypertension, chest pain with prior negative stress echo in 2016, and Guillain Barr. I saw her in clinic on August 10 due to frequent blood pressure spikes, sometimes over 409 mmHg systolic. She was experiencing flushing when that occurred. At the last visit, we looked at her medicine list and changed her hydralazine to 25 mg 3 times a day. She called on August 23 report that blood pressures were more stable but still elevated and then we increased her hydralazine to 37.5 mg 3 times a day. Since then, she has noticed that blood pressures are generally fairly stable in the 120 to 130 range throughout the day. With that, she does also feel better. Since changing the hydralazine dose, she did have one day where blood pressures were in the 160s and 170s, though she attributes that to a stressful house guest. She has not had any chest pain, dyspnea, PND, orthopnea, dizziness, syncope, or early satiety. She is chronic, stable mild lower extremity edema, which she attributes to amlodipine therapy.  Home Medications    Prior to Admission medications   Medication Sig Start Date End Date Taking?  Authorizing Provider  amLODipine (NORVASC) 2.5 MG tablet Take 2.5 mg by mouth at bedtime.   Yes [provider]  carvedilol (COREG) 6.25 MG tablet Take 6.25 mg by mouth 2 (two) times daily with a meal.   Yes [provider]  estradiol (ESTRACE) 1 MG tablet Take 1 mg by mouth daily.   Yes [provider]  hydrALAZINE (APRESOLINE) 25 MG tablet Take 1.5 tablets (37.5 mg total) by mouth 3 (three) times daily. 12/28/16 03/28/17 Yes Rogelia Mire, NP  losartan (COZAAR) 100 MG tablet Take 100 mg by mouth every morning.  08/04/15  Yes [provider]  nitroGLYCERIN (NITROSTAT) 0.4 MG SL tablet Place 1 tablet (0.4 mg total) under the tongue every 5 (five) minutes as needed for chest pain. 11/26/16  Yes Minna Merritts, MD  omeprazole (PRILOSEC) 40 MG capsule Take 40 mg by mouth daily. 08/24/15  Yes [provider]  torsemide (DEMADEX) 5 MG tablet Take 5 mg by mouth daily.   Yes [provider]    Review of Systems    She is chronic, stable, mild ankle swelling. She denies chest pain, palpitations, dyspnea, pnd, orthopnea, n, v, dizziness, syncope, weight gain, or early satiety.  All other systems reviewed and are otherwise negative except as noted above.  Physical Exam    VS:  BP 128/78 (BP Location: Left Arm, Patient Position: Sitting, Cuff Size: Normal)   Pulse 79   Ht 5'  4" (1.626 m)   Wt 147 lb 8 oz (66.9 kg)   BMI 25.32 kg/m  , BMI Body mass index is 25.32 kg/m. GEN: Well nourished, well developed, in no acute distress.  HEENT: normal.  Neck: Supple, no JVD, carotid bruits, or masses. Cardiac: RRR, no murmurs, rubs, or gallops. No clubbing, cyanosis, trace bilateral ankle edema.  Radials/DP/PT 2+ and equal bilaterally.  Respiratory:  Respirations regular and unlabored, clear to auscultation bilaterally. GI: Soft, nontender, nondistended, BS + x 4. MS: no deformity or atrophy. Skin: warm and dry, no rash. Neuro:  Strength and  sensation are intact. Psych: Normal affect.  Accessory Clinical Findings    None  Assessment & Plan    1.  Essential hypertension: At her last visit, she had fairly labile blood pressures with spikes occurring over 200 mmHg. At that point, I increased changed her hydralazine to 25 mg 3 times a day and she did note less fluctuation in blood pressures but still general elevations in the 150s to 160s. We then increased her to 37.5 mg 3 times a day and pressures have been much more stable in the 120s and 130s. She is feeling better and is pleased with her blood pressure management at this point. She also remains on low-dose amlodipine, carvedilol, torsemide and losartan 100 mg daily. No changes today.  2. History of atypical chest pain: No recurrence. Negative stress echo in 2016.  3. Disposition: Patient will continue to follow her blood pressures at home and contact us if necessary. Otherwise, she will follow-up with Dr. Rockey Situ in 6 months or sooner if necessary.  Murray Hodgkins, NP 01/01/2017, 10:25 AM

## 2017-01-11 DIAGNOSIS — J40 Bronchitis, not specified as acute or chronic: Secondary | ICD-10-CM | POA: Diagnosis not present

## 2017-01-11 DIAGNOSIS — R2689 Other abnormalities of gait and mobility: Secondary | ICD-10-CM | POA: Diagnosis not present

## 2017-01-11 DIAGNOSIS — G603 Idiopathic progressive neuropathy: Secondary | ICD-10-CM | POA: Diagnosis not present

## 2017-01-11 DIAGNOSIS — M4712 Other spondylosis with myelopathy, cervical region: Secondary | ICD-10-CM | POA: Diagnosis not present

## 2017-01-16 DIAGNOSIS — J40 Bronchitis, not specified as acute or chronic: Secondary | ICD-10-CM | POA: Diagnosis not present

## 2017-01-16 DIAGNOSIS — R05 Cough: Secondary | ICD-10-CM | POA: Diagnosis not present

## 2017-01-16 DIAGNOSIS — I10 Essential (primary) hypertension: Secondary | ICD-10-CM | POA: Diagnosis not present

## 2017-01-21 ENCOUNTER — Other Ambulatory Visit: Payer: Self-pay | Admitting: Physician Assistant

## 2017-01-21 DIAGNOSIS — R9389 Abnormal findings on diagnostic imaging of other specified body structures: Secondary | ICD-10-CM

## 2017-01-22 ENCOUNTER — Ambulatory Visit
Admission: RE | Admit: 2017-01-22 | Discharge: 2017-01-22 | Disposition: A | Discharge status: Home / Self Care | Payer: PPO | Source: Ambulatory Visit | Attending: Physician Assistant | Admitting: Physician Assistant

## 2017-01-22 DIAGNOSIS — R938 Abnormal findings on diagnostic imaging of other specified body structures: Secondary | ICD-10-CM | POA: Diagnosis not present

## 2017-01-22 DIAGNOSIS — I700 Atherosclerosis of aorta: Secondary | ICD-10-CM | POA: Insufficient documentation

## 2017-01-22 DIAGNOSIS — M4856XA Collapsed vertebra, not elsewhere classified, lumbar region, initial encounter for fracture: Secondary | ICD-10-CM | POA: Insufficient documentation

## 2017-01-22 DIAGNOSIS — R05 Cough: Secondary | ICD-10-CM | POA: Diagnosis not present

## 2017-01-22 DIAGNOSIS — R9389 Abnormal findings on diagnostic imaging of other specified body structures: Secondary | ICD-10-CM

## 2017-01-22 LAB — POCT I-STAT CREATININE: Creatinine, Ser: 0.7 mg/dL (ref 0.44–1.00)

## 2017-01-22 MED ORDER — IOPAMIDOL (ISOVUE-300) INJECTION 61%
75.0000 mL | Freq: Once | INTRAVENOUS | Status: AC | PRN
  Administered 2017-01-22: 75 mL via INTRAVENOUS

## 2017-01-23 ENCOUNTER — Other Ambulatory Visit: Payer: PPO

## 2017-03-18 DIAGNOSIS — M6281 Muscle weakness (generalized): Secondary | ICD-10-CM | POA: Diagnosis not present

## 2017-03-18 DIAGNOSIS — R2681 Unsteadiness on feet: Secondary | ICD-10-CM | POA: Diagnosis not present

## 2017-03-26 DIAGNOSIS — L600 Ingrowing nail: Secondary | ICD-10-CM | POA: Diagnosis not present

## 2017-03-26 DIAGNOSIS — S92515A Nondisplaced fracture of proximal phalanx of left lesser toe(s), initial encounter for closed fracture: Secondary | ICD-10-CM | POA: Diagnosis not present

## 2017-03-26 DIAGNOSIS — M79672 Pain in left foot: Secondary | ICD-10-CM | POA: Diagnosis not present

## 2017-04-03 DIAGNOSIS — M48062 Spinal stenosis, lumbar region with neurogenic claudication: Secondary | ICD-10-CM | POA: Diagnosis not present

## 2017-04-03 DIAGNOSIS — Z6825 Body mass index (BMI) 25.0-25.9, adult: Secondary | ICD-10-CM | POA: Diagnosis not present

## 2017-04-03 DIAGNOSIS — I10 Essential (primary) hypertension: Secondary | ICD-10-CM | POA: Diagnosis not present

## 2017-04-09 ENCOUNTER — Telehealth: Payer: Self-pay | Admitting: Cardiovascular Disease

## 2017-04-09 NOTE — Telephone Encounter (Signed)
Patient is scheduled for a consultation for steroid injection on Monday   She is concerned about injection causing blood pressure issues ( she already takes several meds and bp fluctuates)    Please call to discuss

## 2017-04-09 NOTE — Telephone Encounter (Signed)
Spoke with patient and reviewed that when she goes for her consultation to review her concerns with them. Instructed her to monitor her blood pressures and give [place] a call if she has any further concerns. She verbalized understanding of our conversation, agreement with plan, and had no further questions at this time.

## 2017-04-12 DIAGNOSIS — M5416 Radiculopathy, lumbar region: Secondary | ICD-10-CM | POA: Diagnosis not present

## 2017-04-12 DIAGNOSIS — M48062 Spinal stenosis, lumbar region with neurogenic claudication: Secondary | ICD-10-CM | POA: Diagnosis not present

## 2017-04-17 DIAGNOSIS — R600 Localized edema: Secondary | ICD-10-CM | POA: Diagnosis not present

## 2017-04-17 DIAGNOSIS — I10 Essential (primary) hypertension: Secondary | ICD-10-CM | POA: Diagnosis not present

## 2017-04-17 DIAGNOSIS — M8580 Other specified disorders of bone density and structure, unspecified site: Secondary | ICD-10-CM | POA: Diagnosis not present

## 2017-04-24 DIAGNOSIS — M4712 Other spondylosis with myelopathy, cervical region: Secondary | ICD-10-CM | POA: Diagnosis not present

## 2017-04-24 DIAGNOSIS — G603 Idiopathic progressive neuropathy: Secondary | ICD-10-CM | POA: Diagnosis not present

## 2017-04-24 DIAGNOSIS — I10 Essential (primary) hypertension: Secondary | ICD-10-CM | POA: Diagnosis not present

## 2017-05-08 DIAGNOSIS — M48062 Spinal stenosis, lumbar region with neurogenic claudication: Secondary | ICD-10-CM | POA: Diagnosis not present

## 2017-05-08 DIAGNOSIS — I10 Essential (primary) hypertension: Secondary | ICD-10-CM | POA: Diagnosis not present

## 2017-05-08 DIAGNOSIS — M5416 Radiculopathy, lumbar region: Secondary | ICD-10-CM | POA: Diagnosis not present

## 2017-05-29 DIAGNOSIS — M48062 Spinal stenosis, lumbar region with neurogenic claudication: Secondary | ICD-10-CM | POA: Diagnosis not present

## 2017-05-29 DIAGNOSIS — M5416 Radiculopathy, lumbar region: Secondary | ICD-10-CM | POA: Diagnosis not present

## 2017-06-14 DIAGNOSIS — Z08 Encounter for follow-up examination after completed treatment for malignant neoplasm: Secondary | ICD-10-CM | POA: Diagnosis not present

## 2017-06-14 DIAGNOSIS — Z85828 Personal history of other malignant neoplasm of skin: Secondary | ICD-10-CM | POA: Diagnosis not present

## 2017-06-14 DIAGNOSIS — D045 Carcinoma in situ of skin of trunk: Secondary | ICD-10-CM | POA: Diagnosis not present

## 2017-06-14 DIAGNOSIS — X32XXXA Exposure to sunlight, initial encounter: Secondary | ICD-10-CM | POA: Diagnosis not present

## 2017-06-14 DIAGNOSIS — D485 Neoplasm of uncertain behavior of skin: Secondary | ICD-10-CM | POA: Diagnosis not present

## 2017-06-14 DIAGNOSIS — L821 Other seborrheic keratosis: Secondary | ICD-10-CM | POA: Diagnosis not present

## 2017-06-14 DIAGNOSIS — L570 Actinic keratosis: Secondary | ICD-10-CM | POA: Diagnosis not present

## 2017-07-02 DIAGNOSIS — M25561 Pain in right knee: Secondary | ICD-10-CM | POA: Diagnosis not present

## 2017-07-02 DIAGNOSIS — M170 Bilateral primary osteoarthritis of knee: Secondary | ICD-10-CM | POA: Diagnosis not present

## 2017-07-02 DIAGNOSIS — M25562 Pain in left knee: Secondary | ICD-10-CM | POA: Diagnosis not present

## 2017-07-04 DIAGNOSIS — D045 Carcinoma in situ of skin of trunk: Secondary | ICD-10-CM | POA: Diagnosis not present

## 2017-07-08 ENCOUNTER — Telehealth: Payer: Self-pay | Admitting: Cardiovascular Disease

## 2017-07-08 NOTE — Telephone Encounter (Signed)
Pt overdue for 6 month f/u needs to schedule future appointment.

## 2017-07-08 NOTE — Telephone Encounter (Signed)
°*  STAT* If patient is at the pharmacy, call can be transferred to refill team.   1. Which medications need to be refilled? (please list name of each medication and dose if known) hydrALAZINE (APRESOLINE) 25 MG 1.5 tablet 3 times daily   2. Which pharmacy/location (including street and city if local pharmacy) is medication to be sent to? [facility]  3. Do they need a 30 day or 90 day supply? 90 day

## 2017-07-09 NOTE — Telephone Encounter (Signed)
Scheduled

## 2017-07-10 NOTE — Progress Notes (Signed)
Cardiology Office Note  Date:  07/11/2017   ID:  [name] Woods, [name] Woods 1940-06-08, MRN [id number]  PCP:  Kirk Ruths, MD   Chief Complaint  Patient presents with  . Other    6 month follow up. Patient denies chest pain and SOB. Meds reviewed verbally with patient.     HPI:  77 year old woman with history of Labile Hypertension, causing significant anxiety in the past  Anxiety Guillain-Barre syndrome  Chest pain Previously seen by [facility] cardiology in 2017 Negative stress test 2016 She presents for follow up of her labile hypertension  Seen by neurology for imbalance  Reports that her blood pressure is doing well  Frequent blood pressure spikes on last clinic visit [place] Hydralazine previously changed to 25 3 times daily Later increased up to 37.5 3 times daily Blood pressures were better Also taking amlodipine carvedilol torsemide losartan  Periodically takes nitroglycerin Sometimes extra hold hydralazine for spikes in her pressure  Chills in afternoon every day  Has to put a sweater on to stay warm Wonders if it could be from 1 of her medications  Previously cut her hormone medication down to 1 a day months ago, does not know if this has had any effect on her  Previous workup includes Stress echocardiogram 10/28/2014 with normal LV function, no ischemia  Other past medical history includes Spinal stenosis, back pain, Cervical surgery Trouble walking Legs heavy  EKG personally reviewed by myself on todays visit Shows normal sinus rhythm rate 72 bpm no significant ST or T wave changes  Mom with dementia, Father with FTT   PMH:   has a past medical history of Chest pain, Guillain Barr syndrome ([place]), Hypertension, and Spinal stenosis.  PSH:    Past Surgical History:  Procedure Laterality Date  . ABDOMINAL HYSTERECTOMY    . AUGMENTATION MAMMAPLASTY  2006  . BACK SURGERY      Current Outpatient Medications  Medication Sig Dispense  Refill  . amLODipine (NORVASC) 2.5 MG tablet Take 2.5 mg by mouth at bedtime.    . carvedilol (COREG) 6.25 MG tablet Take 6.25 mg by mouth 2 (two) times daily with a meal.    . estradiol (ESTRACE) 1 MG tablet Take 1 mg by mouth daily.    Marland Kitchen losartan (COZAAR) 100 MG tablet Take 100 mg by mouth every morning.     . nitroGLYCERIN (NITROSTAT) 0.4 MG SL tablet Place 1 tablet (0.4 mg total) under the tongue every 5 (five) minutes as needed for chest pain. 25 tablet 6  . omeprazole (PRILOSEC) 40 MG capsule Take 40 mg by mouth daily.    Marland Kitchen torsemide (DEMADEX) 5 MG tablet Take 5 mg by mouth daily.    . hydrALAZINE (APRESOLINE) 25 MG tablet Take 1.5 tablets (37.5 mg total) by mouth 3 (three) times daily. 135 tablet 3   No current facility-administered medications for this visit.      Allergies:   Patient has no known allergies.   Social History:  The patient  reports that  has never smoked. she has never used smokeless tobacco. She reports that she drinks alcohol. She reports that she does not use drugs.   Family History:   family history includes Hypertension in her father and mother.    Review of Systems: Review of Systems  Constitutional: Negative.   Respiratory: Negative.   Cardiovascular: Negative.   Gastrointestinal: Negative.   Musculoskeletal: Negative.        Leg weakness  Psychiatric/Behavioral: Negative.   All  other systems reviewed and are negative.    PHYSICAL EXAM: VS:  BP 122/72 (BP Location: Left Arm, Patient Position: Sitting, Cuff Size: Normal)   Pulse 72   Ht 5\' 4"  (1.626 m)   Wt 150 lb (68 kg)   BMI 25.75 kg/m  , BMI Body mass index is 25.75 kg/m. Constitutional:  oriented to person, place, and time. No distress.  HENT:  Head: Normocephalic and atraumatic.  Eyes:  no discharge. No scleral icterus.  Neck: Normal range of motion. Neck supple. No JVD present.  Cardiovascular: Normal rate, regular rhythm, normal heart sounds and intact distal pulses. Exam reveals no  gallop and no friction rub. No edema No murmur heard. Pulmonary/Chest: Effort normal and breath sounds normal. No stridor. No respiratory distress.  no wheezes.  no rales.  no tenderness.  Abdominal: Soft.  no distension.  no tenderness.  Musculoskeletal: Normal range of motion.  no  tenderness or deformity.  Neurological:  normal muscle tone. Coordination normal. No atrophy Skin: Skin is warm and dry. No rash noted. not diaphoretic.  Psychiatric:  normal mood and affect. behavior is normal. Thought content normal.    Recent Labs: 01/22/2017: Creatinine, Ser 0.70    Lipid Panel No results found for: CHOL, HDL, LDLCALC, TRIG    Wt Readings from Last 3 Encounters:  07/11/17 150 lb (68 kg)  01/01/17 147 lb 8 oz (66.9 kg)  12/14/16 145 lb 8 oz (66 kg)       ASSESSMENT AND PLAN:  Essential hypertension - Plan: EKG 12-Lead Long discussion concerning her blood pressure, No medication changes made For labile spiking blood pressure she will take nitroglycerin may be extra hydralazine as needed.  Otherwise she is happy with her numbers and regiment Medications refilled  SOB (shortness of breath) - Plan: EKG 12-Lead Stable, no further workup at this time Recommended regular exercise program  Anxiety - Plan: EKG 12-Lead She reports this is improved  Leg edema - Plan: EKG 12-Lead Possibly exacerbated by amlodipine Symptoms improved by decreasing amlodipine Suspect a component of venous insufficiency  Chills Every afternoon etiology unclear Not aware of any medications that would cause this side effect  Disposition:   F/U  12 months As needed    Total encounter time more than 25 minutes  Greater than 50% was spent in counseling and coordination of care with the patient    Orders Placed This Encounter  Procedures  . EKG 12-Lead     Signed, Esmond Plants, M.D., Ph.D. 07/11/2017  [place], [place]

## 2017-07-11 ENCOUNTER — Encounter: Payer: Self-pay | Admitting: Cardiovascular Disease

## 2017-07-11 ENCOUNTER — Ambulatory Visit: Payer: PPO | Admitting: Cardiovascular Disease

## 2017-07-11 VITALS — BP 122/72 | HR 72 | Ht 64.0 in | Wt 150.0 lb

## 2017-07-11 DIAGNOSIS — F419 Anxiety disorder, unspecified: Secondary | ICD-10-CM | POA: Diagnosis not present

## 2017-07-11 DIAGNOSIS — I10 Essential (primary) hypertension: Secondary | ICD-10-CM | POA: Diagnosis not present

## 2017-07-11 DIAGNOSIS — R600 Localized edema: Secondary | ICD-10-CM | POA: Diagnosis not present

## 2017-07-11 DIAGNOSIS — R0602 Shortness of breath: Secondary | ICD-10-CM | POA: Diagnosis not present

## 2017-07-11 DIAGNOSIS — R6883 Chills (without fever): Secondary | ICD-10-CM | POA: Diagnosis not present

## 2017-07-11 DIAGNOSIS — H04123 Dry eye syndrome of bilateral lacrimal glands: Secondary | ICD-10-CM | POA: Diagnosis not present

## 2017-07-11 MED ORDER — HYDRALAZINE HCL 25 MG PO TABS: 37.5000 mg | ORAL_TABLET | Freq: Three times a day (TID) | ORAL | 3 refills | Status: DC

## 2017-07-11 NOTE — Patient Instructions (Addendum)
Medication Instructions:   No medication changes made  Labwork:  No new labs needed  Testing/Procedures:  No further testing at this time  Research CT coronary calcium score $150   Follow-Up: It was a pleasure seeing you in the office today. Please call us if you have new issues that need to be addressed before your next appt.  [phone]  Your physician wants you to follow-up in: 12 months as needed.  You will receive a reminder letter in the mail two months in advance. If you don't receive a letter, please call our office to schedule the follow-up appointment.  If you need a refill on your cardiac medications before your next appointment, please call your pharmacy.  For educational health videos Log in to : [email or web address] Or : [email or web address], password : [email or web address]

## 2017-09-06 DIAGNOSIS — M48062 Spinal stenosis, lumbar region with neurogenic claudication: Secondary | ICD-10-CM | POA: Diagnosis not present

## 2017-09-06 DIAGNOSIS — I10 Essential (primary) hypertension: Secondary | ICD-10-CM | POA: Diagnosis not present

## 2017-09-06 DIAGNOSIS — Z6825 Body mass index (BMI) 25.0-25.9, adult: Secondary | ICD-10-CM | POA: Diagnosis not present

## 2017-09-23 ENCOUNTER — Telehealth: Payer: Self-pay | Admitting: Cardiovascular Disease

## 2017-09-23 NOTE — Telephone Encounter (Signed)
Spoke w/ pt.  She reports that her feet and ankles are swollen more today. She took an extra torsemide today, but has not noticed a difference.  She has compression hose, but has not worn them.  She denies extra fluid or sodium intake.  Advised pt to wear compression hose and keep her feet elevated.  She is agreeable and will call back if sx have not improved by tomorrow.  She is appreciative of the call.

## 2017-09-23 NOTE — Telephone Encounter (Signed)
Pt c/o swelling: STAT is pt has developed SOB within 24 hours  How much weight have you gained and in what time span? About a pound or two  1) If swelling, where is the swelling located? Both feet, left is twice as big as right  2) Are you currently taking a fluid pill? yes  3) Are you currently SOB? No   4) Do you have a log of your daily weights (if so, list)? Does not have one   5) Have you gained 3 pounds in a day or 5 pounds in a week? Think it could be   6) Have you traveled recently? No

## 2017-09-26 DIAGNOSIS — M545 Low back pain: Secondary | ICD-10-CM | POA: Diagnosis not present

## 2017-09-26 DIAGNOSIS — M48062 Spinal stenosis, lumbar region with neurogenic claudication: Secondary | ICD-10-CM | POA: Diagnosis not present

## 2017-09-26 DIAGNOSIS — I10 Essential (primary) hypertension: Secondary | ICD-10-CM | POA: Diagnosis not present

## 2017-09-26 DIAGNOSIS — Z6825 Body mass index (BMI) 25.0-25.9, adult: Secondary | ICD-10-CM | POA: Diagnosis not present

## 2017-10-07 ENCOUNTER — Telehealth: Payer: Self-pay | Admitting: Physician Assistant

## 2017-10-07 NOTE — Telephone Encounter (Signed)
[name] Woods called because her blood pressure had been running high.  It was as high as 202 systolic.  She is asymptomatic but concerned.  She has not taken any over-the-counter medications.  She has not had any unusual amount of caffeine.  There is no acute illness.  She has taken nitroglycerin couple of times with improvement, but it was temporary.  I advised her that she could take her afternoon dose of hydralazine now.  It is okay to take an extra amlodipine 2.5 mg if her systolic blood pressure does not come down and stay down with the nitroglycerin.  She can take up to 10 mg a day. I requested that she increase Her standing amlodipine dose to 2.5 mg twice daily.  Her heart rate and blood pressure may also tolerate an increase in carvedilol to 12.5 mg daily.  Therefore, I also told her it was okay to take an extra carvedilol tab as needed.  Advised her I would route this to Dr. Rockey Situ.

## 2017-10-08 DIAGNOSIS — M48062 Spinal stenosis, lumbar region with neurogenic claudication: Secondary | ICD-10-CM | POA: Diagnosis not present

## 2017-10-08 NOTE — Telephone Encounter (Signed)
Agree with taking nitroglycerin for spiking pressure Followed by hydralazine extra 25 If blood pressure continues to run high would take extra amlodipine 2.5

## 2017-10-09 ENCOUNTER — Other Ambulatory Visit: Payer: Self-pay | Admitting: Neurosurgery

## 2017-10-09 ENCOUNTER — Other Ambulatory Visit: Payer: Self-pay | Admitting: Internal Medicine

## 2017-10-09 DIAGNOSIS — M48062 Spinal stenosis, lumbar region with neurogenic claudication: Secondary | ICD-10-CM

## 2017-10-09 DIAGNOSIS — Z1231 Encounter for screening mammogram for malignant neoplasm of breast: Secondary | ICD-10-CM

## 2017-10-09 NOTE — Telephone Encounter (Signed)
Patient verbalized understanding of Dr Donivan Scull recommendations. Stated it was bad on Sunday and then dropped back to normal on Monday. No problems since then.

## 2017-10-17 DIAGNOSIS — I10 Essential (primary) hypertension: Secondary | ICD-10-CM | POA: Diagnosis not present

## 2017-10-17 DIAGNOSIS — M4802 Spinal stenosis, cervical region: Secondary | ICD-10-CM | POA: Diagnosis not present

## 2017-10-17 DIAGNOSIS — G992 Myelopathy in diseases classified elsewhere: Secondary | ICD-10-CM | POA: Diagnosis not present

## 2017-10-18 ENCOUNTER — Other Ambulatory Visit: Payer: PPO

## 2017-10-21 ENCOUNTER — Ambulatory Visit
Admission: RE | Admit: 2017-10-21 | Discharge: 2017-10-21 | Disposition: A | Discharge status: Home / Self Care | Payer: PPO | Source: Ambulatory Visit | Attending: Neurosurgery | Admitting: Neurosurgery

## 2017-10-21 DIAGNOSIS — M48062 Spinal stenosis, lumbar region with neurogenic claudication: Secondary | ICD-10-CM

## 2017-10-21 DIAGNOSIS — M48061 Spinal stenosis, lumbar region without neurogenic claudication: Secondary | ICD-10-CM | POA: Diagnosis not present

## 2017-10-21 MED ORDER — DIAZEPAM 5 MG PO TABS
5.0000 mg | ORAL_TABLET | Freq: Once | ORAL | Status: AC
  Administered 2017-10-21: 5 mg via ORAL

## 2017-10-21 MED ORDER — IOPAMIDOL (ISOVUE-M 200) INJECTION 41%
15.0000 mL | Freq: Once | INTRAMUSCULAR | Status: AC
  Administered 2017-10-21: 15 mL via INTRATHECAL

## 2017-10-21 NOTE — Discharge Instructions (Signed)

## 2017-10-24 DIAGNOSIS — G603 Idiopathic progressive neuropathy: Secondary | ICD-10-CM | POA: Diagnosis not present

## 2017-10-24 DIAGNOSIS — Z23 Encounter for immunization: Secondary | ICD-10-CM | POA: Diagnosis not present

## 2017-10-24 DIAGNOSIS — Z0000 Encounter for general adult medical examination without abnormal findings: Secondary | ICD-10-CM | POA: Diagnosis not present

## 2017-10-24 DIAGNOSIS — I10 Essential (primary) hypertension: Secondary | ICD-10-CM | POA: Diagnosis not present

## 2017-10-24 DIAGNOSIS — M19049 Primary osteoarthritis, unspecified hand: Secondary | ICD-10-CM | POA: Diagnosis not present

## 2017-10-25 ENCOUNTER — Ambulatory Visit
Admission: RE | Admit: 2017-10-25 | Discharge: 2017-10-25 | Disposition: A | Discharge status: Home / Self Care | Payer: PPO | Source: Ambulatory Visit | Attending: Internal Medicine | Admitting: Internal Medicine

## 2017-10-25 DIAGNOSIS — Z1231 Encounter for screening mammogram for malignant neoplasm of breast: Secondary | ICD-10-CM | POA: Diagnosis not present

## 2017-11-01 DIAGNOSIS — M48062 Spinal stenosis, lumbar region with neurogenic claudication: Secondary | ICD-10-CM | POA: Diagnosis not present

## 2017-11-08 DIAGNOSIS — Z0000 Encounter for general adult medical examination without abnormal findings: Secondary | ICD-10-CM | POA: Diagnosis not present

## 2017-11-13 DIAGNOSIS — I10 Essential (primary) hypertension: Secondary | ICD-10-CM | POA: Diagnosis not present

## 2017-11-13 DIAGNOSIS — Z6825 Body mass index (BMI) 25.0-25.9, adult: Secondary | ICD-10-CM | POA: Diagnosis not present

## 2017-11-13 DIAGNOSIS — M48062 Spinal stenosis, lumbar region with neurogenic claudication: Secondary | ICD-10-CM | POA: Diagnosis not present

## 2017-12-10 ENCOUNTER — Other Ambulatory Visit: Payer: Self-pay | Admitting: Cardiovascular Disease

## 2018-01-01 DIAGNOSIS — L720 Epidermal cyst: Secondary | ICD-10-CM | POA: Diagnosis not present

## 2018-01-01 DIAGNOSIS — D3612 Benign neoplasm of peripheral nerves and autonomic nervous system, upper limb, including shoulder: Secondary | ICD-10-CM | POA: Diagnosis not present

## 2018-01-01 DIAGNOSIS — L750 Bromhidrosis: Secondary | ICD-10-CM | POA: Diagnosis not present

## 2018-01-01 DIAGNOSIS — X32XXXA Exposure to sunlight, initial encounter: Secondary | ICD-10-CM | POA: Diagnosis not present

## 2018-01-01 DIAGNOSIS — L570 Actinic keratosis: Secondary | ICD-10-CM | POA: Diagnosis not present

## 2018-01-07 DIAGNOSIS — M79671 Pain in right foot: Secondary | ICD-10-CM | POA: Diagnosis not present

## 2018-01-07 DIAGNOSIS — M216X1 Other acquired deformities of right foot: Secondary | ICD-10-CM | POA: Diagnosis not present

## 2018-01-07 DIAGNOSIS — L600 Ingrowing nail: Secondary | ICD-10-CM | POA: Diagnosis not present

## 2018-01-14 DIAGNOSIS — G603 Idiopathic progressive neuropathy: Secondary | ICD-10-CM | POA: Diagnosis not present

## 2018-01-14 DIAGNOSIS — R2689 Other abnormalities of gait and mobility: Secondary | ICD-10-CM | POA: Diagnosis not present

## 2018-01-14 DIAGNOSIS — G959 Disease of spinal cord, unspecified: Secondary | ICD-10-CM | POA: Diagnosis not present

## 2018-01-14 DIAGNOSIS — Z8669 Personal history of other diseases of the nervous system and sense organs: Secondary | ICD-10-CM | POA: Diagnosis not present

## 2018-01-14 DIAGNOSIS — R600 Localized edema: Secondary | ICD-10-CM | POA: Diagnosis not present

## 2018-01-14 DIAGNOSIS — M48062 Spinal stenosis, lumbar region with neurogenic claudication: Secondary | ICD-10-CM | POA: Diagnosis not present

## 2018-01-16 DIAGNOSIS — M545 Low back pain: Secondary | ICD-10-CM | POA: Diagnosis not present

## 2018-01-16 DIAGNOSIS — G609 Hereditary and idiopathic neuropathy, unspecified: Secondary | ICD-10-CM | POA: Insufficient documentation

## 2018-01-16 DIAGNOSIS — R202 Paresthesia of skin: Secondary | ICD-10-CM | POA: Diagnosis not present

## 2018-01-16 DIAGNOSIS — Z8619 Personal history of other infectious and parasitic diseases: Secondary | ICD-10-CM | POA: Diagnosis not present

## 2018-01-16 DIAGNOSIS — G8929 Other chronic pain: Secondary | ICD-10-CM | POA: Diagnosis not present

## 2018-01-16 DIAGNOSIS — R531 Weakness: Secondary | ICD-10-CM | POA: Diagnosis not present

## 2018-01-16 DIAGNOSIS — R200 Anesthesia of skin: Secondary | ICD-10-CM | POA: Diagnosis not present

## 2018-01-16 DIAGNOSIS — E119 Type 2 diabetes mellitus without complications: Secondary | ICD-10-CM | POA: Diagnosis not present

## 2018-01-28 DIAGNOSIS — M216X1 Other acquired deformities of right foot: Secondary | ICD-10-CM | POA: Diagnosis not present

## 2018-01-28 DIAGNOSIS — M79671 Pain in right foot: Secondary | ICD-10-CM | POA: Diagnosis not present

## 2018-01-28 DIAGNOSIS — M79672 Pain in left foot: Secondary | ICD-10-CM | POA: Diagnosis not present

## 2018-01-28 DIAGNOSIS — L600 Ingrowing nail: Secondary | ICD-10-CM | POA: Diagnosis not present

## 2018-02-06 DIAGNOSIS — G609 Hereditary and idiopathic neuropathy, unspecified: Secondary | ICD-10-CM | POA: Diagnosis not present

## 2018-02-06 DIAGNOSIS — M5418 Radiculopathy, sacral and sacrococcygeal region: Secondary | ICD-10-CM | POA: Diagnosis not present

## 2018-02-06 DIAGNOSIS — G6289 Other specified polyneuropathies: Secondary | ICD-10-CM | POA: Diagnosis not present

## 2018-02-10 ENCOUNTER — Other Ambulatory Visit: Payer: Self-pay | Admitting: Cardiovascular Disease

## 2018-03-12 DIAGNOSIS — M48062 Spinal stenosis, lumbar region with neurogenic claudication: Secondary | ICD-10-CM | POA: Diagnosis not present

## 2018-04-16 DIAGNOSIS — M4807 Spinal stenosis, lumbosacral region: Secondary | ICD-10-CM | POA: Diagnosis not present

## 2018-04-16 DIAGNOSIS — M48062 Spinal stenosis, lumbar region with neurogenic claudication: Secondary | ICD-10-CM | POA: Diagnosis not present

## 2018-05-05 ENCOUNTER — Other Ambulatory Visit: Payer: Self-pay | Admitting: Cardiovascular Disease

## 2018-05-22 ENCOUNTER — Telehealth: Payer: Self-pay | Admitting: Physician Assistant

## 2018-05-22 NOTE — Telephone Encounter (Signed)
Patient called because her blood pressure was unusually high today.  It had spiked earlier, she took sublingual nitroglycerin x2 but without relief.  Her last blood pressure was 182/96 with a heart rate of 94.  She has not yet had her evening dose of carvedilol or hydralazine.  She also has amlodipine 2.5 mg that she can take as needed.  Recommended that she take her evening dose of carvedilol and an extra amlodipine 2.5 mg now.  Take the hydralazine 37.5 mg at bedtime as usual.  If her blood pressure does not improve, take an extra 25 mg of hydralazine.  She states she has not seen Dr. Rockey Situ in a while, requested she call the office tomorrow to make an appointment.  Rosaria Ferries, PA-C 05/22/2018 7:02 PM Beeper [phone]

## 2018-05-23 ENCOUNTER — Telehealth: Payer: Self-pay | Admitting: Cardiovascular Disease

## 2018-05-23 NOTE — Telephone Encounter (Signed)
Pt reports HTN that started yesterday afternoon. She denies change in diet or new stressors. NO chest pain, SOB or palpitations. Denies swelling or headache.   She spoke with triage line after hours yesterday and took additional dose of both Coreg and amlodipine as ordered. She reported BP better after that.   BP values as follows;  1/16  AM 130/83 HR 74, PM 152/85 HR 78 Evening 182/96, after additional medication 152/80. 1/17 AM 148/83 HR 79 before medication Now 155/84 HR 84 after medication.  She is seeing Christell Faith, PA on 1/21. In the meantime she would like to know of possible medication changes, if any. She is wondering about PRN doses with parameters.   Routed to provider for further advice.  Advised pt to call for any further questions or concerns

## 2018-05-23 NOTE — Telephone Encounter (Signed)
Discussed POC from provider. Pt agreeable to POC and verbalized understanding. She will continue with appt scheduled for next week.    Advised pt to call for any further questions or concerns

## 2018-05-23 NOTE — Telephone Encounter (Signed)
Pt c/o BP issue: STAT if pt c/o blurred vision, one-sided weakness or slurred speech  1. What are your last 5 BP readings?  Last night 182/96 Took medication 152/80  2. Are you having any other symptoms (ex. Dizziness, headache, blurred vision, passed out)? No   3. What is your BP issue? Blood pressure has been jumping around.  Spoke with cardiologist on call last night and was told to take medications and call and to make an appointment. Patient scheduled to see R. Dunn on 1/21.  Please call to discuss.

## 2018-05-23 NOTE — Telephone Encounter (Signed)
1) Please ensure patient is following a low-sodium diet.    2) Recommend increasing Coreg to 12.5 mg twice daily.  Hopefully with this change, she can depend less on PRN amlodipine.    3) Given her heart rates have been in the 70s to 80s beats per minute, would favor escalation of carvedilol as above and possible scheduled dosing of amlodipine with tapering of hydralazine.  This can be discussed with the patient further at her office visit with me next week.

## 2018-05-25 NOTE — Telephone Encounter (Signed)
Would call to f/u on BP Can see me or APP in f/u Hx of labile BP at times

## 2018-05-26 NOTE — Progress Notes (Deleted)
Cardiology Office Note Date:  05/26/2018  Patient ID:  [facility] [name] Woods, [name] Woods 09-11-1940, MRN [id number] PCP:  Kirk Ruths, MD  Cardiologist:  Dr. Rockey Situ, MD  ***refresh   Chief Complaint: Elevated BP  History of Present Illness: [name] Woods is a 78 y.o. female with history of labile hypertension leading to significant anxiety, Guillain-Barr syndrome, imbalance seen by neurology, spinal stenosis, and chest pain who presents for follow-up of labile hypertension.  She was previously followed by [facility] cardiology with a negative stress echo in 2016 with normal LV function.  She was last seen by Dr. Rockey Situ in 07/2017 for follow-up of her labile hypertension.  She reported prior frequent blood pressure spikes.  Blood pressure at her visit in 07/2017 was noted to be 122/72.  No changes were made.  She was continued on amlodipine 2.5 mg nightly, Coreg 6.25 mg twice daily, hydralazine 37.5 mg 3 times daily, losartan 100 mg in the morning, torsemide 5 mg daily, and sublingual nitroglycerin as needed chest pain/elevated BP.  Patient called in 12/7562 noting systolic BP in the 332R.  She was advised to take sublingual nitroglycerin for spiking blood pressure followed by hydralazine 25 mg daily, and if needed extra amlodipine 2.5 mg.  She called the office again on 05/22/2018 noting blood pressure of 182/96 with a heart rate of 94.  She had not yet taken her evening dose of Coreg or hydralazine.  It was recommended to take her evening doses of Coreg and hydralazine as well as an extra dose of amlodipine 2.5 mg.  It was subsequently recommended that the patient increase her Coreg to 12.5 mg twice daily in an effort to depend less on a as needed amlodipine.  ***  Past Medical History:  Diagnosis Date  . Chest pain    a. 10/2014 St Echo: nl LV fxn, no wma/ischemia.  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 Hypertension   . Spinal stenosis     Past Surgical History:  Procedure  Laterality Date  . ABDOMINAL HYSTERECTOMY    . AUGMENTATION MAMMAPLASTY  2006  . BACK SURGERY      No outpatient medications have been marked as taking for the 05/27/18 encounter (Appointment) with Rise Mu, PA-C.    Allergies:   Patient has no known allergies.   Social History:  The patient  reports that she has never smoked. She has never used smokeless tobacco. She reports current alcohol use. She reports that she does not use drugs.   Family History:  The patient's family history includes Hypertension in her father and mother.  ROS:   ROS   PHYSICAL EXAM: *** VS:  There were no vitals taken for this visit. BMI: There is no height or weight on file to calculate BMI.  Physical Exam   EKG:  Was ordered and interpreted by me today. Shows ***  Recent Labs: No results found for requested labs within last 8760 hours.  No results found for requested labs within last 8760 hours.   CrCl cannot be calculated (Patient's most recent lab result is older than the maximum 21 days allowed.).   Wt Readings from Last 3 Encounters:  07/11/17 150 lb (68 kg)  01/01/17 147 lb 8 oz (66.9 kg)  12/14/16 145 lb 8 oz (66 kg)     Other studies reviewed: Additional studies/records reviewed today include: summarized above  ASSESSMENT AND PLAN:  1. ***  Disposition: F/u with Dr. Rockey Situ or APP in ***  Current  medicines are reviewed at length with the patient today.  The patient did not have any concerns regarding medicines.  Signed, Christell Faith, PA-C 05/26/2018 8:05 AM     [facility] [address] [place], [place] [address] [phone]

## 2018-05-26 NOTE — Telephone Encounter (Signed)
Call returned to the patient to check on her blood pressures. She stated that her blood pressures are back to normal and have not been high since that one incident. She did not have any specific readings to give.  She has a follow up appointment with Christell Faith, PA tomorrow 05/27/2018

## 2018-05-27 ENCOUNTER — Ambulatory Visit: Payer: PPO | Admitting: Physician Assistant

## 2018-06-10 DIAGNOSIS — M170 Bilateral primary osteoarthritis of knee: Secondary | ICD-10-CM | POA: Diagnosis not present

## 2018-06-16 NOTE — Progress Notes (Signed)
Cardiology Office Note Date:  06/17/2018  Patient ID:  [name] Woods, [name] Woods Feb 05, 1941, MRN [id number] PCP:  Kirk Ruths, MD  Cardiologist:  Dr. Rockey Situ, MD    Chief Complaint: Elevated BP  History of Present Illness: [name] Woods is a 78 y.o. female with history of labile hypertension leading to significant anxiety, Guillain-Barr syndrome, imbalance seen by neurology, spinal stenosis, and chest pain who presents for follow-up of labile hypertension.  She was previously followed by [facility]ology with a negative stress echo in 2016 with normal LV function.  She was last seen by Dr. Rockey Situ in 07/2017 for follow-up of her labile hypertension.  She reported prior frequent blood pressure spikes.  Blood pressure at her visit in 07/2017 was noted to be 122/72.  No changes were made.  She was continued on amlodipine 2.5 mg nightly, Coreg 6.25 mg twice daily, hydralazine 37.5 mg 3 times daily, losartan 100 mg in the morning, torsemide 5 mg daily, and sublingual nitroglycerin as needed chest pain/elevated BP.  Patient called in 05/270 noting systolic BP in the 536U.  She was advised to take sublingual nitroglycerin for spiking blood pressure followed by hydralazine 25 mg daily, and if needed extra amlodipine 2.5 mg.  She called the office again on 05/22/2018 noting blood pressure of 182/96 with a heart rate of 94.  She had not yet taken her evening dose of Coreg or hydralazine.  It was recommended to take her evening doses of Coreg and hydralazine as well as an extra dose of amlodipine 2.5 mg.  It was subsequently recommended that the patient increase her Coreg to 12.5 mg twice daily in an effort to depend less on a as needed amlodipine.  Since the above phone calls in 05/2018 the patient has noted improvement in her blood pressure readings with most readings ranging from the 440H to 474Q systolic with a rare reading into the 595G systolic and occasional readings into the 387F  systolic.  She has rarely noted soft blood pressures following her morning medications into the 64P systolic.  Currently, she is taking amlodipine 2.5 mg around 10 PM, Coreg 6.25 mg 8:30 in the morning and 10 PM at night, hydralazine 37.5 mg 3 times daily, losartan 100 mg in the a.m., and torsemide 5 mg in the a.m.  She is wondering if she needs to adjust her p.m. medications in an effort to maintain adequate blood pressure control overnight and first thing in the morning.  She denies any chest pain, shortness of breath, palpitations, dizziness, presyncope, or syncope.  She reports minimal salt intake and denies any recent dietary changes.  She notes there is no stress in her life.  Otherwise, she is doing well.  Past Medical History:  Diagnosis Date  . Chest pain    a. 10/2014 St Echo: nl LV fxn, no wma/ischemia.  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 Hypertension   . Spinal stenosis     Past Surgical History:  Procedure Laterality Date  . ABDOMINAL HYSTERECTOMY    . AUGMENTATION MAMMAPLASTY  2006  . BACK SURGERY      Current Meds  Medication Sig  . amLODipine (NORVASC) 2.5 MG tablet Take 2.5 mg by mouth daily.   . carvedilol (COREG) 6.25 MG tablet Take 6.25 mg by mouth See admin instructions. Take 1 tablet (6.25 mg) in the morning and 2 tablets (12.5 mg) in the afternoon.  Marland Kitchen estradiol (ESTRACE) 1 MG tablet Take 1 mg by mouth daily.  Marland Kitchen  hydrALAZINE (APRESOLINE) 25 MG tablet TAKE ONE AND A HALF TABLETS (37.5MG ) BY MOUTH 2 TIMES DAILY  . losartan (COZAAR) 100 MG tablet Take 100 mg by mouth every morning.   . nitroGLYCERIN (NITROSTAT) 0.4 MG SL tablet PLACE 1 TABLET UNDER TONGUE EVERY 5 MIN AS NEEDED FOR CHEST PAIN IF NO RELIEF IN15 MIN CALL 911 (MAX 3 TABS)  . omeprazole (PRILOSEC) 40 MG capsule Take 40 mg by mouth daily.  Marland Kitchen torsemide (DEMADEX) 5 MG tablet Take 5 mg by mouth daily.  . [DISCONTINUED] hydrALAZINE (APRESOLINE) 25 MG tablet TAKE ONE AND A HALF TABLETS (37.5MG ) BY MOUTH 3 TIMES  DAILY    Allergies:   Azithromycin   Social History:  The patient  reports that she has never smoked. She has never used smokeless tobacco. She reports current alcohol use. She reports that she does not use drugs.   Family History:  The patient's family history includes Hypertension in her father and mother.  ROS:   Review of Systems  Constitutional: Negative for chills, diaphoresis, fever, malaise/fatigue and weight loss.  HENT: Negative for congestion.   Eyes: Negative for discharge and redness.  Respiratory: Negative for cough, hemoptysis, sputum production, shortness of breath and wheezing.   Cardiovascular: Negative for chest pain, palpitations, orthopnea, claudication, leg swelling and PND.  Gastrointestinal: Negative for abdominal pain, blood in stool, heartburn, melena, nausea and vomiting.  Genitourinary: Negative for hematuria.  Musculoskeletal: Negative for falls and myalgias.  Skin: Negative for rash.  Neurological: Negative for dizziness, tingling, tremors, sensory change, speech change, focal weakness, loss of consciousness and weakness.  Endo/Heme/Allergies: Does not bruise/bleed easily.  Psychiatric/Behavioral: Negative for substance abuse. The patient is not nervous/anxious.   All other systems reviewed and are negative.    PHYSICAL EXAM:  VS:  BP 110/78 (BP Location: Left Arm, Patient Position: Sitting, Cuff Size: Normal)   Pulse 73   Ht 5\' 3"  (1.6 m)   Wt 153 lb (69.4 kg)   BMI 27.10 kg/m  BMI: Body mass index is 27.1 kg/m.  Physical Exam  Constitutional: She is oriented to person, place, and time. She appears well-developed and well-nourished.  HENT:  Head: Normocephalic and atraumatic.  Eyes: Right eye exhibits no discharge. Left eye exhibits no discharge.  Neck: Normal range of motion. No JVD present.  Cardiovascular: Normal rate, regular rhythm, S1 normal, S2 normal and normal heart sounds. Exam reveals no distant heart sounds, no friction rub, no  midsystolic click and no opening snap.  No murmur heard. Pulses:      Posterior tibial pulses are 2+ on the right side and 2+ on the left side.  Pulmonary/Chest: Effort normal and breath sounds normal. No respiratory distress. She has no decreased breath sounds. She has no wheezes. She has no rales. She exhibits no tenderness.  Abdominal: Soft. She exhibits no distension. There is no abdominal tenderness.  Musculoskeletal:        General: No edema.  Neurological: She is alert and oriented to person, place, and time.  Skin: Skin is warm and dry. No cyanosis. Nails show no clubbing.  Psychiatric: She has a normal mood and affect. Her speech is normal and behavior is normal. Judgment and thought content normal.     EKG:  Was ordered and interpreted by me today. Shows NSR, 73 bpm, no acute ST-T changes  Recent Labs: No results found for requested labs within last 8760 hours.  No results found for requested labs within last 8760 hours.   CrCl  cannot be calculated (Patient's most recent lab result is older than the maximum 21 days allowed.).   Wt Readings from Last 3 Encounters:  06/17/18 153 lb (69.4 kg)  07/11/17 150 lb (68 kg)  01/01/17 147 lb 8 oz (66.9 kg)     Other studies reviewed: Additional studies/records reviewed today include: summarized above  ASSESSMENT AND PLAN:  1. Hypertension: Increase p.m. dose of Coreg to 12.5 mg twice daily.  Discontinue midday dose of hydralazine.  Otherwise, she will continue amlodipine 2.5 mg each night, Coreg 6.25 mg in the morning, hydralazine 37.5 mg twice daily, losartan 100 mg in the morning, and torsemide 5 mg in the morning.  Ultimately, my goal is to discontinue her hydralazine altogether with escalation of Coreg eventually to 12.5 mg twice daily.  I would prefer to not escalate her dose of amlodipine secondary to history of lower extremity swelling with calcium channel blocker usage.  She will call back in 1 week to notify me of blood  pressure readings.  Disposition: F/u with Dr. Rockey Situ or an APP in 2 to 4 weeks.  Current medicines are reviewed at length with the patient today.  The patient did not have any concerns regarding medicines.  Signed, Christell Faith, PA-C 06/17/2018 12:02 PM     [place], [place] [address] [phone]

## 2018-06-17 ENCOUNTER — Encounter: Payer: Self-pay | Admitting: Physician Assistant

## 2018-06-17 ENCOUNTER — Ambulatory Visit: Payer: PPO | Admitting: Physician Assistant

## 2018-06-17 VITALS — BP 110/78 | HR 73 | Ht 63.0 in | Wt 153.0 lb

## 2018-06-17 DIAGNOSIS — I10 Essential (primary) hypertension: Secondary | ICD-10-CM | POA: Diagnosis not present

## 2018-06-17 MED ORDER — HYDRALAZINE HCL 25 MG PO TABS: ORAL_TABLET | ORAL | 1 refills | Status: DC

## 2018-06-17 NOTE — Patient Instructions (Signed)
Medication Instructions:  Your physician has recommended you make the following change in your medication: 1- INCREASE Coreg Take 1 tablet (6.25 mg) in the morning and 2 tablets (12.5 mg) in the afternoon. 2- DECREASE Hydralazine to TAKE ONE AND A HALF TABLETS (37.5MG ) BY MOUTH 2 TIMES DAILY If you need a refill on your cardiac medications before your next appointment, please call your pharmacy.   Lab work: None ordered  If you have labs (blood work) drawn today and your tests are completely normal, you will receive your results only by: Marland Kitchen MyChart Message (if you have MyChart) OR . A paper copy in the mail If you have any lab test that is abnormal or we need to change your treatment, we will call you to review the results.  Testing/Procedures: None ordered   Follow-Up: At [facility], you and your health needs are our priority.  As part of our continuing mission to provide you with exceptional heart care, we have created designated Provider Care Teams.  These Care Teams include your primary Cardiologist (physician) and Advanced Practice Providers (APPs -  Physician Assistants and Nurse Practitioners) who all work together to provide you with the care you need, when you need it. You will need a follow up appointment in 4 weeks.  You may see Dr. Rockey Situ or Christell Faith, PA-C  Any Other Special Instructions Will Be Listed Below (If Applicable). Please call the office in 1 week to give updates on BP.

## 2018-06-18 DIAGNOSIS — M170 Bilateral primary osteoarthritis of knee: Secondary | ICD-10-CM | POA: Diagnosis not present

## 2018-06-23 NOTE — Telephone Encounter (Signed)
Mychart Note from patient, "As per your instructions,I have added additional Carvedilol 6.25 at night and dropped the afternoonhydralazine.  Morning BP continues to be about143/80 before meds but drops to about 110/77 about an hour later.Late afternoon reading about 140/75-163/88.Some days it drops in the evenings to about 134/75.Do I need to make any adjustments to meds?Thanks, [name] Woods"  I made call to patient. She reports that she has been feeling well since the med adjustment last week by Christell Faith, PA. She denies any problems assoc with HTN episodes.   Pt wants to know if she should continue monitoring and wait until next appt in March or make an adjustment before then?  Routed to provider.

## 2018-06-24 DIAGNOSIS — M170 Bilateral primary osteoarthritis of knee: Secondary | ICD-10-CM | POA: Diagnosis not present

## 2018-06-24 NOTE — Telephone Encounter (Signed)
Call to discuss medication change from provider. Pt verbalized understanding to take losartan 0.5 tablet (50 mg total) twice daily.  No further questions at this time.

## 2018-07-03 DIAGNOSIS — H353131 Nonexudative age-related macular degeneration, bilateral, early dry stage: Secondary | ICD-10-CM | POA: Diagnosis not present

## 2018-07-08 DIAGNOSIS — R2689 Other abnormalities of gait and mobility: Secondary | ICD-10-CM | POA: Diagnosis not present

## 2018-07-08 DIAGNOSIS — R278 Other lack of coordination: Secondary | ICD-10-CM | POA: Diagnosis not present

## 2018-07-08 DIAGNOSIS — M6281 Muscle weakness (generalized): Secondary | ICD-10-CM | POA: Diagnosis not present

## 2018-07-16 NOTE — Progress Notes (Signed)
Cardiology Office Note Date:  07/17/2018  Patient ID:  [name], [name] Sep 10, 1940, MRN [id number] PCP:  Kirk Ruths, MD  Cardiologist:  Dr. Rockey Situ, MD    Chief Complaint: Follow up  History of Present Illness: [name] Woods is a 78 y.o. female with history of labile hypertension leading to significant anxiety, Guillain-Barr syndrome, imbalance seen by neurology, spinal stenosis, and chest pain who presents for follow-up of hypertension.  She was previously followed by [facility] cardiology with a negative stress echo in 2016 with normal LV function.  She was last seen by Dr. Rockey Situ in 07/2017 for follow-up of her labile hypertension.  She reported prior frequent blood pressure spikes.  Blood pressure at her visit in 07/2017 was noted to be 122/72.  No changes were made.  She was continued on amlodipine 2.5 mg nightly, Coreg 6.25 mg twice daily, hydralazine 37.5 mg 3 times daily, losartan 100 mg in the morning, torsemide 5 mg daily, and sublingual nitroglycerin as needed chest pain/elevated BP.  Patient called in 09/4268 noting systolic BP in the 623J.  She was advised to take sublingual nitroglycerin for spiking blood pressure followed by hydralazine 25 mg daily, and if needed extra amlodipine 2.5 mg.  She called the office again on 05/22/2018 noting blood pressure of 182/96 with a heart rate of 94.  She had not yet taken her evening dose of Coreg or hydralazine.  It was recommended to take her evening doses of Coreg and hydralazine as well as an extra dose of amlodipine 2.5 mg.  It was subsequently recommended that the patient increase her Coreg to 12.5 mg twice daily in an effort to depend less on a as needed amlodipine.  Patient was seen in the office most recently on 06/17/2018 noting improvement in her blood pressure readings with most readings in the 628B to 151V systolic and a rare reading in the 180s systol  She rarely noted soft blood pressures following her morning  medications into the 61Y systolic.  At that time, she was taking amlodipine 2.5 mg around 10 PM, Coreg 6.25 mg 8:30 in the morning and 10 PM, hydralazine 37.5 mg 3 times daily, torsemide 5 mg in the morning, and losartan 100 mg in the morning.  We increased her p.m. dose of Coreg to 12.5, her midday hydralazine was discontinued, otherwise she was continued on amlodipine 2.5 mg at night, Coreg 6.25 mg in the morning, hydralazine 37.5 mg twice daily, losartan 100 mg in the morning, and torsemide 5 mg in the morning.  She contacted our office on 06/23/2018 noting continued drops in her morning blood pressures to 073 systolic.  In this setting, her losartan was changed to 50 mg twice daily.  Follow-up message on 3/9 indicated blood pressures had improved and stabilized without significant fluctuations.  Patient comes in doing well today.  Since making the above changes to her antihypertensives, blood pressures are much less labile with most readings in the 130s to 710G with low systolic readings in the 269S.  She has had an isolated systolic of 854.  She is pleased with her improvement.  She does note some intermittent lower extremity swelling that is typically worse later on in the day.  In the past, she has had 2 stop amlodipine secondary to lower extremity swelling.  No chest pain, shortness of breath, dizziness, presyncope, or syncope.  No falls since she was last seen.  She continues to follow a low-salt diet.    Past Medical History:  Diagnosis Date   Chest pain    a. 10/2014 St Echo: nl LV fxn, no wma/ischemia.   Guillain Barr syndrome ([place])    Hypertension    Spinal stenosis     Past Surgical History:  Procedure Laterality Date   ABDOMINAL HYSTERECTOMY     AUGMENTATION MAMMAPLASTY  2006   BACK SURGERY      Current Meds  Medication Sig   amLODipine (NORVASC) 2.5 MG tablet Take 2.5 mg by mouth daily.    carvedilol (COREG) 6.25 MG tablet Take 6.25 mg by mouth See admin instructions.  Take 1 tablet (6.25 mg) in the morning and 2 tablets (12.5 mg) in the afternoon.   estradiol (ESTRACE) 1 MG tablet Take 1 mg by mouth daily.   hydrALAZINE (APRESOLINE) 25 MG tablet TAKE ONE AND A HALF TABLETS (37.5MG ) BY MOUTH 2 TIMES DAILY   losartan (COZAAR) 50 MG tablet Take 50 mg by mouth 2 (two) times daily.    nitroGLYCERIN (NITROSTAT) 0.4 MG SL tablet PLACE 1 TABLET UNDER TONGUE EVERY 5 MIN AS NEEDED FOR CHEST PAIN IF NO RELIEF IN15 MIN CALL 911 (MAX 3 TABS)   omeprazole (PRILOSEC) 40 MG capsule Take 40 mg by mouth daily.   torsemide (DEMADEX) 5 MG tablet Take 5 mg by mouth daily.    Allergies:   Azithromycin   Social History:  The patient  reports that she has never smoked. She has never used smokeless tobacco. She reports current alcohol use. She reports that she does not use drugs.   Family History:  The patient's family history includes Hypertension in her father and mother.  ROS:   Review of Systems  Constitutional: Negative for chills, diaphoresis, fever, malaise/fatigue and weight loss.  HENT: Negative for congestion.   Eyes: Negative for discharge and redness.  Respiratory: Negative for cough, hemoptysis, sputum production, shortness of breath and wheezing.   Cardiovascular: Positive for leg swelling. Negative for chest pain, palpitations, orthopnea, claudication and PND.  Gastrointestinal: Negative for abdominal pain, blood in stool, heartburn, melena, nausea and vomiting.  Genitourinary: Negative for hematuria.  Musculoskeletal: Negative for falls and myalgias.  Skin: Negative for rash.  Neurological: Negative for dizziness, tingling, tremors, sensory change, speech change, focal weakness, loss of consciousness and weakness.  Endo/Heme/Allergies: Does not bruise/bleed easily.  Psychiatric/Behavioral: Negative for substance abuse. The patient is not nervous/anxious.   All other systems reviewed and are negative.    PHYSICAL EXAM:  VS:  BP 112/66 (BP Location:  Left Arm, Patient Position: Sitting, Cuff Size: Normal)    Pulse 71    Ht 5\' 3"  (1.6 m)    Wt 153 lb 12 oz (69.7 kg)    BMI 27.24 kg/m  BMI: Body mass index is 27.24 kg/m.  Physical Exam  Constitutional: She is oriented to person, place, and time. She appears well-developed and well-nourished.  HENT:  Head: Normocephalic and atraumatic.  Eyes: Right eye exhibits no discharge. Left eye exhibits no discharge.  Neck: Normal range of motion. No JVD present.  Cardiovascular: Normal rate, regular rhythm, S1 normal, S2 normal and normal heart sounds. Exam reveals no distant heart sounds, no friction rub, no midsystolic click and no opening snap.  No murmur heard. Pulses:      Posterior tibial pulses are 2+ on the right side and 2+ on the left side.  Pulmonary/Chest: Effort normal and breath sounds normal. No respiratory distress. She has no decreased breath sounds. She has no wheezes. She has no rales. She exhibits no  tenderness.  Abdominal: Soft. She exhibits no distension. There is no abdominal tenderness.  Musculoskeletal:        General: No edema.  Neurological: She is alert and oriented to person, place, and time.  Skin: Skin is warm and dry. No cyanosis. Nails show no clubbing.  Psychiatric: She has a normal mood and affect. Her speech is normal and behavior is normal. Judgment and thought content normal.     EKG:  Was ordered and interpreted by me today. Shows NSR, 71 bpm, no acute ST-T changes  Recent Labs: No results found for requested labs within last 8760 hours.  No results found for requested labs within last 8760 hours.   CrCl cannot be calculated (Patient's most recent lab result is older than the maximum 21 days allowed.).   Wt Readings from Last 3 Encounters:  07/17/18 153 lb 12 oz (69.7 kg)  06/17/18 153 lb (69.4 kg)  07/11/17 150 lb (68 kg)     Other studies reviewed: Additional studies/records reviewed today include: summarized above  ASSESSMENT AND  PLAN:  1. Labile hypertension: Blood pressure is much more controlled.  We will continue to escalate antihypertensive therapy and consolidate medications in an effort to minimize significant blood pressure swings and simplify her medication regimen.  With this said, we will discontinue her p.m. amlodipine and a.m. hydralazine.  To compensate for this, we will increase her morning dose of Coreg to 12.5, she is already taken 12.5 Coreg in the evening.  She will otherwise remain on losartan 50 mg twice daily and torsemide 5 mg daily.  Continue low-sodium diet.  2. Lower extremity swelling: Consistent with venous insufficiency and possible calcium channel blocker usage.  Recommend patient elevate legs.  She has compression stockings at home though has not been wearing them recently.  Discontinue calcium channel blocker as above.  Disposition: F/u with Dr. Rockey Situ or an APP in 2 months.  Current medicines are reviewed at length with the patient today.  The patient did not have any concerns regarding medicines.  Signed, Christell Faith, PA-C 07/17/2018 11:34 AM     [place] [address] [place] [address] [place], [place] [address] [phone]

## 2018-07-17 ENCOUNTER — Ambulatory Visit: Payer: PPO | Admitting: Physician Assistant

## 2018-07-17 ENCOUNTER — Other Ambulatory Visit: Payer: Self-pay

## 2018-07-17 ENCOUNTER — Encounter: Payer: Self-pay | Admitting: Physician Assistant

## 2018-07-17 VITALS — BP 112/66 | HR 71 | Ht 63.0 in | Wt 153.8 lb

## 2018-07-17 DIAGNOSIS — R0989 Other specified symptoms and signs involving the circulatory and respiratory systems: Secondary | ICD-10-CM | POA: Diagnosis not present

## 2018-07-17 DIAGNOSIS — R600 Localized edema: Secondary | ICD-10-CM

## 2018-07-17 MED ORDER — TORSEMIDE 5 MG PO TABS: 5.0000 mg | ORAL_TABLET | Freq: Every day | ORAL | 3 refills | Status: DC

## 2018-07-17 MED ORDER — HYDRALAZINE HCL 25 MG PO TABS: ORAL_TABLET | ORAL | 3 refills | Status: DC

## 2018-07-17 MED ORDER — LOSARTAN POTASSIUM 50 MG PO TABS: 50.0000 mg | ORAL_TABLET | Freq: Two times a day (BID) | ORAL | 3 refills | Status: DC

## 2018-07-17 MED ORDER — CARVEDILOL 12.5 MG PO TABS: 12.5000 mg | ORAL_TABLET | Freq: Two times a day (BID) | ORAL | 3 refills | Status: DC

## 2018-07-17 NOTE — Patient Instructions (Signed)
Medication Instructions:  Your physician has recommended you make the following change in your medication:  1- STOP Amlodipine  2- DECREASE Hydralazine TAKE ONE AND A HALF TABLETS (37.5MG ) BY MOUTH once daily in the evening 3- INCREASE Coreg Take 1 tablet (12.5 mg total) by mouth 2 (two) times daily.  If you need a refill on your cardiac medications before your next appointment, please call your pharmacy.   Lab work: None ordered  If you have labs (blood work) drawn today and your tests are completely normal, you will receive your results only by: Marland Kitchen MyChart Message (if you have MyChart) OR . A paper copy in the mail If you have any lab test that is abnormal or we need to change your treatment, we will call you to review the results.  Testing/Procedures: None ordered   Follow-Up: At [facility], you and your health needs are our priority.  As part of our continuing mission to provide you with exceptional heart care, we have created designated Provider Care Teams.  These Care Teams include your primary Cardiologist (physician) and Advanced Practice Providers (APPs -  Physician Assistants and Nurse Practitioners) who all work together to provide you with the care you need, when you need it. You will need a follow up appointment in 2 months. You may see Dr. Rockey Situ  or one of the following Advanced Practice Providers on your designated Care Team:   Christell Faith, [place] . Marrianne Mood, PA-C  Also,  Call the clinic in 2 weeks with BP readings.  How to use a home blood pressure monitor. . Be still. Don't smoke, drink caffeinated beverages or exercise within 30 minutes before measuring your blood pressure. . Sit correctly. Sit with your back straight and supported (on a dining chair, rather than a sofa). Your feet should be flat on the floor and your legs should not be crossed. Your arm should be supported on a flat surface (such as a table) with the upper arm at heart level. Make sure the  bottom of the cuff is placed directly above the bend of the elbow.  . Measure at the same time every day. It's important to take the readings at the same time each day, such as morning and evening. Take reading approximately 1 hour after BP medications.

## 2018-07-30 DIAGNOSIS — M48062 Spinal stenosis, lumbar region with neurogenic claudication: Secondary | ICD-10-CM | POA: Diagnosis not present

## 2018-08-05 ENCOUNTER — Other Ambulatory Visit: Payer: Self-pay | Admitting: Physician Assistant

## 2018-08-05 NOTE — Telephone Encounter (Signed)
Medication was sent in 07/17/2018

## 2018-08-07 DIAGNOSIS — R278 Other lack of coordination: Secondary | ICD-10-CM | POA: Diagnosis not present

## 2018-08-07 DIAGNOSIS — R2689 Other abnormalities of gait and mobility: Secondary | ICD-10-CM | POA: Diagnosis not present

## 2018-08-07 DIAGNOSIS — M6281 Muscle weakness (generalized): Secondary | ICD-10-CM | POA: Diagnosis not present

## 2018-08-21 ENCOUNTER — Telehealth: Payer: Self-pay

## 2018-08-21 NOTE — Telephone Encounter (Signed)
Call attempted to reschedule 09/16/2018 face to face visit with Dr. Rockey Situ to a telephone or video visit due to current clinic policies related to COVID 19 precautions. Left voicemail message requesting for patient to call back to discuss.

## 2018-08-25 ENCOUNTER — Telehealth: Payer: Self-pay

## 2018-08-25 NOTE — Telephone Encounter (Signed)
Virtual Visit Pre-Appointment Phone Call  Steps For Call:  1. Confirm consent - "In the setting of the current Covid19 crisis, you are scheduled for a phone visit with your provider on Sep 16, 2018 at 10:20AM.  Just as we do with many in-office visits, in order for you to participate in this visit, we must obtain consent.  If you'd like, I can send this to your mychart (if signed up) or email for you to review.  Otherwise, I can obtain your verbal consent now.  All virtual visits are billed to your insurance company just like a normal visit would be.  By agreeing to a virtual visit, we'd like you to understand that the technology does not allow for your provider to perform an examination, and thus may limit your provider's ability to fully assess your condition. If your provider identifies any concerns that need to be evaluated in person, we will make arrangements to do so.  Finally, though the technology is pretty good, we cannot assure that it will always work on either your or our end, and in the setting of a video visit, we may have to convert it to a phone-only visit.  In either situation, we cannot ensure that we have a secure connection.  Are you willing to proceed?" STAFF: Did the patient verbally acknowledge consent to telehealth visit? Document YES/NO here: YES  2. Confirm the BEST phone number to call the day of the visit by including in appointment notes  3. Give patient instructions for WebEx/MyChart download to smartphone as below or Doximity/[email or web address] if video visit (depending on what platform provider is using)  4. Advise patient to be prepared with their blood pressure, heart rate, weight, any heart rhythm information, their current medicines, and a piece of paper and pen handy for any instructions they may receive the day of their visit  5. Inform patient they will receive a phone call 15 minutes prior to their appointment time (may be from unknown caller ID) so they should be  prepared to answer  6. Confirm that appointment type is correct in Epic appointment notes (VIDEO vs PHONE)     TELEPHONE CALL NOTE  [name] Woods has been deemed a candidate for a follow-up tele-health visit to limit community exposure during the Covid-19 pandemic. I spoke with the patient via phone to ensure availability of phone/video source, confirm preferred email & phone number, and discuss instructions and expectations.  I reminded [name] Woods to be prepared with any vital sign and/or heart rhythm information that could potentially be obtained via home monitoring, at the time of her visit. I reminded [name] Woods to expect a phone call at the time of her visit if her visit.  [name] 08/25/2018 3:00 PM    FULL LENGTH CONSENT FOR TELE-HEALTH VISIT   I hereby voluntarily request, consent and authorize [facility] and its employed or contracted physicians, physician assistants, nurse practitioners or other licensed health care professionals (the Practitioner), to provide me with telemedicine health care services (the "Services") as deemed necessary by the treating Practitioner. I acknowledge and consent to receive the Services by the Practitioner via telemedicine. I understand that the telemedicine visit will involve communicating with the Practitioner through live audiovisual communication technology and the disclosure of certain medical information by electronic transmission. I acknowledge that I have been given the opportunity to request an in-person assessment or other available alternative prior to the telemedicine visit and am voluntarily participating  in the telemedicine visit.  I understand that I have the right to withhold or withdraw my consent to the use of telemedicine in the course of my care at any time, without affecting my right to future care or treatment, and that the Practitioner or I may terminate the telemedicine  visit at any time. I understand that I have the right to inspect all information obtained and/or recorded in the course of the telemedicine visit and may receive copies of available information for a reasonable fee.  I understand that some of the potential risks of receiving the Services via telemedicine include:  Marland Kitchen Delay or interruption in medical evaluation due to technological equipment failure or disruption; . Information transmitted may not be sufficient (e.g. poor resolution of images) to allow for appropriate medical decision making by the Practitioner; and/or  . In rare instances, security protocols could fail, causing a breach of personal health information.  Furthermore, I acknowledge that it is my responsibility to provide information about my medical history, conditions and care that is complete and accurate to the best of my ability. I acknowledge that Practitioner's advice, recommendations, and/or decision may be based on factors not within their control, such as incomplete or inaccurate data provided by me or distortions of diagnostic images or specimens that may result from electronic transmissions. I understand that the practice of medicine is not an exact science and that Practitioner makes no warranties or guarantees regarding treatment outcomes. I acknowledge that I will receive a copy of this consent concurrently upon execution via email to the email address I last provided but may also request a printed copy by calling the office of [place].    I understand that my insurance will be billed for this visit.   I have read or had this consent read to me. . I understand the contents of this consent, which adequately explains the benefits and risks of the Services being provided via telemedicine.  . I have been provided ample opportunity to ask questions regarding this consent and the Services and have had my questions answered to my satisfaction. . I give my informed consent for  the services to be provided through the use of telemedicine in my medical care  By participating in this telemedicine visit I agree to the above.

## 2018-08-25 NOTE — Telephone Encounter (Signed)
Patient returned my telephone call and we discussed telehealth consent-please refer to 08/25/2018 telephone encounter.

## 2018-09-14 NOTE — Progress Notes (Signed)
Virtual Visit via Telephone Note   This visit type was conducted due to national recommendations for restrictions regarding the COVID-19 Pandemic (e.g. social distancing) in an effort to limit this patient's exposure and mitigate transmission in our community.  Due to her co-morbid illnesses, this patient is at least at moderate risk for complications without adequate follow up.  This format is felt to be most appropriate for this patient at this time.  The patient did not have access to video technology/had technical difficulties with video requiring transitioning to audio format only (telephone).  All issues noted in this document were discussed and addressed.  No physical exam could be performed with this format.  Please refer to the patient's chart for her  consent to telehealth for [facility].   I connected with  [name] Woods on 09/16/18 by a video enabled telemedicine application and verified that I am speaking with the correct person using two identifiers. I discussed the limitations of evaluation and management by telemedicine. The patient expressed understanding and agreed to proceed.   Evaluation Performed:  Follow-up visit  Date:  09/16/2018   ID:  [name] Woods, Drennon 1940-11-16, MRN [id number]  Patient Location:  [place] [address]   Provider location:   [facility], [place] office  PCP:  Kirk Ruths, MD  Cardiologist:  [place], [place]   Chief Complaint:  Labile pressures    History of Present Illness:    [name] Woods is a 78 y.o. female who presents via audio/video conferencing for a telehealth visit today.   The patient does not symptoms concerning for COVID-19 infection (fever, chills, cough, or new SHORTNESS OF BREATH).   Patient has a past medical history of Labile Hypertension, causing significant anxiety in the past  Anxiety Guillain-Barre syndrome  Chest pain Previously seen  by [facility] cardiology in 2017 Negative stress test 2016 She presents for follow up of her labile hypertension  Long discussion today concerning her recent blood pressure management BP not bad at home,since changes  07/2018, at which time amlodipine was held for leg swelling  morning dose of Coreg 25 BID  losartan 100 daily Hydralazine 25 evening, 10 pm  torsemide 1/2 of a 5 mg daily Am  BP in general is ok Rare ankle swelling  Some blood pressure spikes on last clinic visit  Periodically takes nitroglycerin Sometimes extra  hydralazine for spikes in her pressure  Labs reviewed Total chol 221, LDL 141 Does not want a statin  Previous workup includes Stress echocardiogram 10/28/2014 with normal LV function, no ischemia  Other past medical history includes Spinal stenosis, back pain, Cervical surgery Trouble walking Legs heavy  Mom with dementia, Father with FTT    Prior CV studies:   The following studies were reviewed today:    Past Medical History:  Diagnosis Date  . Chest pain    a. 10/2014 St Echo: nl LV fxn, no wma/ischemia.  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 Hypertension   . Spinal stenosis    Past Surgical History:  Procedure Laterality Date  . ABDOMINAL HYSTERECTOMY    . AUGMENTATION MAMMAPLASTY  2006  . BACK SURGERY       Current Meds  Medication Sig  . carvedilol (COREG) 12.5 MG tablet Take 2 tablets by mouth Twice a day  . estradiol (ESTRACE) 1 MG tablet Take 1 mg by mouth daily.  . hydrALAZINE (APRESOLINE) 25 MG tablet TAKE ONE AND  A HALF TABLETS (37.5MG ) BY MOUTH once daily in the evening  . losartan (COZAAR) 100 MG tablet Take 100 mg by mouth daily.  . nitroGLYCERIN (NITROSTAT) 0.4 MG SL tablet PLACE 1 TABLET UNDER TONGUE EVERY 5 MIN AS NEEDED FOR CHEST PAIN IF NO RELIEF IN15 MIN CALL 911 (MAX 3 TABS)  . omeprazole (PRILOSEC) 40 MG capsule Take 40 mg by mouth daily.  Marland Kitchen torsemide (DEMADEX) 5 MG tablet Take 1 tablet (5 mg total) by mouth  daily.     Allergies:   Azithromycin   Social History   Tobacco Use  . Smoking status: Never Smoker  . Smokeless tobacco: Never Used  Substance Use Topics  . Alcohol use: Yes    Comment: occasional  . Drug use: No     Current Outpatient Medications on File Prior to Visit  Medication Sig Dispense Refill  . carvedilol (COREG) 12.5 MG tablet Take 2 tablets by mouth Twice a day    . estradiol (ESTRACE) 1 MG tablet Take 1 mg by mouth daily.    . hydrALAZINE (APRESOLINE) 25 MG tablet TAKE ONE AND A HALF TABLETS (37.5MG ) BY MOUTH once daily in the evening 90 tablet 3  . losartan (COZAAR) 100 MG tablet Take 100 mg by mouth daily.    . nitroGLYCERIN (NITROSTAT) 0.4 MG SL tablet PLACE 1 TABLET UNDER TONGUE EVERY 5 MIN AS NEEDED FOR CHEST PAIN IF NO RELIEF IN15 MIN CALL 911 (MAX 3 TABS) 25 tablet 0  . omeprazole (PRILOSEC) 40 MG capsule Take 40 mg by mouth daily.    Marland Kitchen torsemide (DEMADEX) 5 MG tablet Take 1 tablet (5 mg total) by mouth daily. 90 tablet 3   No current facility-administered medications on file prior to visit.      Family Hx: The patient's family history includes Hypertension in her father and mother. There is no history of Breast cancer.  ROS:   Please see the history of present illness.    Review of Systems  Constitutional: Negative.   HENT: Negative.   Respiratory: Negative.   Cardiovascular: Negative.   Gastrointestinal: Negative.   Musculoskeletal: Negative.   Neurological: Negative.   Psychiatric/Behavioral: Negative.   All other systems reviewed and are negative.    Labs/Other Tests and Data Reviewed:    Recent Labs: No results found for requested labs within last 8760 hours.   Recent Lipid Panel No results found for: CHOL, TRIG, HDL, CHOLHDL, LDLCALC, LDLDIRECT  Wt Readings from Last 3 Encounters:  09/16/18 148 lb (67.1 kg)  07/17/18 153 lb 12 oz (69.7 kg)  06/17/18 153 lb (69.4 kg)     Exam:    Vital Signs: Vital signs may also be detailed in  the HPI BP 136/73 Comment: before meds  Pulse 67   Ht 5\' 3"  (1.6 m)   Wt 148 lb (67.1 kg)   BMI 26.22 kg/m   Wt Readings from Last 3 Encounters:  09/16/18 148 lb (67.1 kg)  07/17/18 153 lb 12 oz (69.7 kg)  06/17/18 153 lb (69.4 kg)   Temp Readings from Last 3 Encounters:  08/29/15 98.7 F (37.1 C) (Oral)   BP Readings from Last 3 Encounters:  09/16/18 136/73  07/17/18 112/66  06/17/18 110/78   Pulse Readings from Last 3 Encounters:  09/16/18 67  07/17/18 71  06/17/18 73    110/60, pulse 70, resp 16  Well nourished, well developed female in no acute distress. Constitutional:  oriented to person, place, and time. No distress.  ASSESSMENT & PLAN:    Labile hypertension BP stable, sometimes labile For high pressure, she take amlodipine or extra hydralazine as needed She is not taking amlodipine on a regular basis, taking Coreg 25 twice daily losartan 100 with hydralazine in the evening  SOB (shortness of breath) Denies having significant symptoms Recommend regular walking program for conditioning  Leg edema Previously had minimal symptoms, amlodipine held, now occasional foot swelling  Anxiety Periods of anxiety, managed by primary care Overall stable  Atypical chest pain No recent episodes of chest pain, no further work-up needed  COVID-19 Education: The signs and symptoms of COVID-19 were discussed with the patient and how to seek care for testing (follow up with PCP or arrange E-visit).  The importance of social distancing was discussed today.  Patient Risk:   After full review of this patients clinical status, I feel that they are at least moderate risk at this time.  Time:   Today, I have spent 25 minutes with the patient with telehealth technology discussing the cardiac and medical problems/diagnoses detailed above   10 min spent reviewing the chart prior to patient visit today   Medication Adjustments/Labs and Tests Ordered: Current medicines  are reviewed at length with the patient today.  Concerns regarding medicines are outlined above.   Tests Ordered: No tests ordered   Medication Changes: No changes made   Disposition: Follow-up in 12 months   Signed, Ida Rogue, MD  09/16/2018 11:10 AM    [place] Office [address], [place], [place] [address]

## 2018-09-16 ENCOUNTER — Other Ambulatory Visit: Payer: Self-pay

## 2018-09-16 ENCOUNTER — Telehealth (INDEPENDENT_AMBULATORY_CARE_PROVIDER_SITE_OTHER): Payer: PPO | Admitting: Cardiovascular Disease

## 2018-09-16 VITALS — BP 136/73 | HR 67 | Ht 63.0 in | Wt 148.0 lb

## 2018-09-16 DIAGNOSIS — R600 Localized edema: Secondary | ICD-10-CM | POA: Diagnosis not present

## 2018-09-16 DIAGNOSIS — R0602 Shortness of breath: Secondary | ICD-10-CM

## 2018-09-16 DIAGNOSIS — F419 Anxiety disorder, unspecified: Secondary | ICD-10-CM | POA: Diagnosis not present

## 2018-09-16 DIAGNOSIS — R0989 Other specified symptoms and signs involving the circulatory and respiratory systems: Secondary | ICD-10-CM | POA: Insufficient documentation

## 2018-09-16 DIAGNOSIS — R0789 Other chest pain: Secondary | ICD-10-CM | POA: Diagnosis not present

## 2018-09-16 MED ORDER — CARVEDILOL 25 MG PO TABS: 25.0000 mg | ORAL_TABLET | Freq: Two times a day (BID) | ORAL | 3 refills | Status: DC

## 2018-09-16 NOTE — Patient Instructions (Addendum)
Medication Instructions:  Your physician has recommended you make the following change in your medication:  1. INCREASE Carvedilol to 25 mg 1 tablet twice a day.   If you need a refill on your cardiac medications before your next appointment, please call your pharmacy.    Lab work: No new labs needed   If you have labs (blood work) drawn today and your tests are completely normal, you will receive your results only by: Marland Kitchen MyChart Message (if you have MyChart) OR . A paper copy in the mail If you have any lab test that is abnormal or we need to change your treatment, we will call you to review the results.   Testing/Procedures: No new testing needed   Follow-Up: At [facility], you and your health needs are our priority.  As part of our continuing mission to provide you with exceptional heart care, we have created designated Provider Care Teams.  These Care Teams include your primary Cardiologist (physician) and Advanced Practice Providers (APPs -  Physician Assistants and Nurse Practitioners) who all work together to provide you with the care you need, when you need it.  . You will need a follow up appointment in 12 months .   Please call our office 2 months in advance to schedule this appointment.    . Providers on your designated Care Team:   . Murray Hodgkins, NP . Christell Faith, PA-C . Marrianne Mood, PA-C  Any Other Special Instructions Will Be Listed Below (If Applicable).  For educational health videos Log in to : [email or web address] Or : [email or web address], password : [email or web address]

## 2018-09-25 ENCOUNTER — Other Ambulatory Visit: Payer: Self-pay | Admitting: Internal Medicine

## 2018-09-25 DIAGNOSIS — G603 Idiopathic progressive neuropathy: Secondary | ICD-10-CM | POA: Diagnosis not present

## 2018-09-25 DIAGNOSIS — Z1231 Encounter for screening mammogram for malignant neoplasm of breast: Secondary | ICD-10-CM

## 2018-09-25 DIAGNOSIS — Z0000 Encounter for general adult medical examination without abnormal findings: Secondary | ICD-10-CM | POA: Diagnosis not present

## 2018-09-25 DIAGNOSIS — I10 Essential (primary) hypertension: Secondary | ICD-10-CM | POA: Diagnosis not present

## 2018-10-02 DIAGNOSIS — Z9989 Dependence on other enabling machines and devices: Secondary | ICD-10-CM | POA: Diagnosis not present

## 2018-10-02 DIAGNOSIS — K219 Gastro-esophageal reflux disease without esophagitis: Secondary | ICD-10-CM | POA: Diagnosis not present

## 2018-10-02 DIAGNOSIS — M4802 Spinal stenosis, cervical region: Secondary | ICD-10-CM | POA: Diagnosis not present

## 2018-10-02 DIAGNOSIS — I10 Essential (primary) hypertension: Secondary | ICD-10-CM | POA: Diagnosis not present

## 2018-10-02 DIAGNOSIS — M19049 Primary osteoarthritis, unspecified hand: Secondary | ICD-10-CM | POA: Diagnosis not present

## 2018-10-02 DIAGNOSIS — G992 Myelopathy in diseases classified elsewhere: Secondary | ICD-10-CM | POA: Diagnosis not present

## 2018-10-27 ENCOUNTER — Ambulatory Visit
Admission: RE | Admit: 2018-10-27 | Discharge: 2018-10-27 | Disposition: A | Discharge status: Home / Self Care | Payer: PPO | Source: Ambulatory Visit | Attending: Internal Medicine | Admitting: Internal Medicine

## 2018-10-27 ENCOUNTER — Other Ambulatory Visit: Payer: Self-pay

## 2018-10-27 DIAGNOSIS — Z1231 Encounter for screening mammogram for malignant neoplasm of breast: Secondary | ICD-10-CM | POA: Insufficient documentation

## 2018-12-15 DIAGNOSIS — M5416 Radiculopathy, lumbar region: Secondary | ICD-10-CM | POA: Diagnosis not present

## 2018-12-15 DIAGNOSIS — Z8669 Personal history of other diseases of the nervous system and sense organs: Secondary | ICD-10-CM | POA: Diagnosis not present

## 2018-12-15 DIAGNOSIS — G609 Hereditary and idiopathic neuropathy, unspecified: Secondary | ICD-10-CM | POA: Diagnosis not present

## 2018-12-17 DIAGNOSIS — Z8669 Personal history of other diseases of the nervous system and sense organs: Secondary | ICD-10-CM | POA: Insufficient documentation

## 2018-12-17 DIAGNOSIS — M5416 Radiculopathy, lumbar region: Secondary | ICD-10-CM | POA: Insufficient documentation

## 2018-12-31 DIAGNOSIS — X32XXXA Exposure to sunlight, initial encounter: Secondary | ICD-10-CM | POA: Diagnosis not present

## 2018-12-31 DIAGNOSIS — Z85828 Personal history of other malignant neoplasm of skin: Secondary | ICD-10-CM | POA: Diagnosis not present

## 2018-12-31 DIAGNOSIS — L538 Other specified erythematous conditions: Secondary | ICD-10-CM | POA: Diagnosis not present

## 2018-12-31 DIAGNOSIS — L570 Actinic keratosis: Secondary | ICD-10-CM | POA: Diagnosis not present

## 2018-12-31 DIAGNOSIS — D2271 Melanocytic nevi of right lower limb, including hip: Secondary | ICD-10-CM | POA: Diagnosis not present

## 2018-12-31 DIAGNOSIS — L820 Inflamed seborrheic keratosis: Secondary | ICD-10-CM | POA: Diagnosis not present

## 2018-12-31 DIAGNOSIS — D2262 Melanocytic nevi of left upper limb, including shoulder: Secondary | ICD-10-CM | POA: Diagnosis not present

## 2018-12-31 DIAGNOSIS — D2261 Melanocytic nevi of right upper limb, including shoulder: Secondary | ICD-10-CM | POA: Diagnosis not present

## 2019-01-06 DIAGNOSIS — M79675 Pain in left toe(s): Secondary | ICD-10-CM | POA: Diagnosis not present

## 2019-01-06 DIAGNOSIS — M79674 Pain in right toe(s): Secondary | ICD-10-CM | POA: Diagnosis not present

## 2019-01-06 DIAGNOSIS — L600 Ingrowing nail: Secondary | ICD-10-CM | POA: Diagnosis not present

## 2019-01-23 DIAGNOSIS — Z8669 Personal history of other diseases of the nervous system and sense organs: Secondary | ICD-10-CM | POA: Diagnosis not present

## 2019-01-23 DIAGNOSIS — R29898 Other symptoms and signs involving the musculoskeletal system: Secondary | ICD-10-CM | POA: Diagnosis not present

## 2019-01-23 DIAGNOSIS — M5416 Radiculopathy, lumbar region: Secondary | ICD-10-CM | POA: Diagnosis not present

## 2019-01-23 DIAGNOSIS — G629 Polyneuropathy, unspecified: Secondary | ICD-10-CM | POA: Diagnosis not present

## 2019-01-23 DIAGNOSIS — G6289 Other specified polyneuropathies: Secondary | ICD-10-CM | POA: Diagnosis not present

## 2019-01-30 DIAGNOSIS — Z981 Arthrodesis status: Secondary | ICD-10-CM | POA: Diagnosis not present

## 2019-01-30 DIAGNOSIS — M5136 Other intervertebral disc degeneration, lumbar region: Secondary | ICD-10-CM | POA: Diagnosis not present

## 2019-01-30 DIAGNOSIS — M5134 Other intervertebral disc degeneration, thoracic region: Secondary | ICD-10-CM | POA: Diagnosis not present

## 2019-01-30 DIAGNOSIS — M438X4 Other specified deforming dorsopathies, thoracic region: Secondary | ICD-10-CM | POA: Diagnosis not present

## 2019-01-30 DIAGNOSIS — Z7409 Other reduced mobility: Secondary | ICD-10-CM | POA: Diagnosis not present

## 2019-01-30 DIAGNOSIS — M5031 Other cervical disc degeneration,  high cervical region: Secondary | ICD-10-CM | POA: Diagnosis not present

## 2019-01-30 DIAGNOSIS — M4312 Spondylolisthesis, cervical region: Secondary | ICD-10-CM | POA: Diagnosis not present

## 2019-01-30 DIAGNOSIS — M47816 Spondylosis without myelopathy or radiculopathy, lumbar region: Secondary | ICD-10-CM | POA: Diagnosis not present

## 2019-01-30 DIAGNOSIS — M5137 Other intervertebral disc degeneration, lumbosacral region: Secondary | ICD-10-CM | POA: Diagnosis not present

## 2019-01-30 DIAGNOSIS — M47812 Spondylosis without myelopathy or radiculopathy, cervical region: Secondary | ICD-10-CM | POA: Diagnosis not present

## 2019-01-30 DIAGNOSIS — R269 Unspecified abnormalities of gait and mobility: Secondary | ICD-10-CM | POA: Diagnosis not present

## 2019-01-30 DIAGNOSIS — M438X6 Other specified deforming dorsopathies, lumbar region: Secondary | ICD-10-CM | POA: Diagnosis not present

## 2019-01-30 DIAGNOSIS — M40204 Unspecified kyphosis, thoracic region: Secondary | ICD-10-CM | POA: Diagnosis not present

## 2019-01-30 DIAGNOSIS — M8949 Other hypertrophic osteoarthropathy, multiple sites: Secondary | ICD-10-CM | POA: Diagnosis not present

## 2019-01-30 DIAGNOSIS — M8588 Other specified disorders of bone density and structure, other site: Secondary | ICD-10-CM | POA: Diagnosis not present

## 2019-01-30 DIAGNOSIS — M5030 Other cervical disc degeneration, unspecified cervical region: Secondary | ICD-10-CM | POA: Diagnosis not present

## 2019-01-30 DIAGNOSIS — I700 Atherosclerosis of aorta: Secondary | ICD-10-CM | POA: Diagnosis not present

## 2019-02-09 DIAGNOSIS — M5416 Radiculopathy, lumbar region: Secondary | ICD-10-CM | POA: Diagnosis not present

## 2019-02-09 DIAGNOSIS — Z8669 Personal history of other diseases of the nervous system and sense organs: Secondary | ICD-10-CM | POA: Diagnosis not present

## 2019-02-11 DIAGNOSIS — M4856XA Collapsed vertebra, not elsewhere classified, lumbar region, initial encounter for fracture: Secondary | ICD-10-CM | POA: Diagnosis not present

## 2019-02-11 DIAGNOSIS — Z981 Arthrodesis status: Secondary | ICD-10-CM | POA: Diagnosis not present

## 2019-02-11 DIAGNOSIS — R269 Unspecified abnormalities of gait and mobility: Secondary | ICD-10-CM | POA: Diagnosis not present

## 2019-02-11 DIAGNOSIS — M2578 Osteophyte, vertebrae: Secondary | ICD-10-CM | POA: Diagnosis not present

## 2019-02-11 DIAGNOSIS — Z7409 Other reduced mobility: Secondary | ICD-10-CM | POA: Diagnosis not present

## 2019-02-11 DIAGNOSIS — M47812 Spondylosis without myelopathy or radiculopathy, cervical region: Secondary | ICD-10-CM | POA: Diagnosis not present

## 2019-03-09 DIAGNOSIS — Z9181 History of falling: Secondary | ICD-10-CM | POA: Diagnosis not present

## 2019-03-09 DIAGNOSIS — R531 Weakness: Secondary | ICD-10-CM | POA: Diagnosis not present

## 2019-03-09 DIAGNOSIS — G609 Hereditary and idiopathic neuropathy, unspecified: Secondary | ICD-10-CM | POA: Diagnosis not present

## 2019-03-09 DIAGNOSIS — M25562 Pain in left knee: Secondary | ICD-10-CM | POA: Diagnosis not present

## 2019-03-09 DIAGNOSIS — Z7409 Other reduced mobility: Secondary | ICD-10-CM | POA: Diagnosis not present

## 2019-03-09 DIAGNOSIS — M25561 Pain in right knee: Secondary | ICD-10-CM | POA: Diagnosis not present

## 2019-03-09 DIAGNOSIS — M5030 Other cervical disc degeneration, unspecified cervical region: Secondary | ICD-10-CM | POA: Diagnosis not present

## 2019-03-09 DIAGNOSIS — Z9889 Other specified postprocedural states: Secondary | ICD-10-CM | POA: Diagnosis not present

## 2019-03-09 DIAGNOSIS — R2689 Other abnormalities of gait and mobility: Secondary | ICD-10-CM | POA: Diagnosis not present

## 2019-03-19 DIAGNOSIS — M19049 Primary osteoarthritis, unspecified hand: Secondary | ICD-10-CM | POA: Diagnosis not present

## 2019-03-19 DIAGNOSIS — I10 Essential (primary) hypertension: Secondary | ICD-10-CM | POA: Diagnosis not present

## 2019-03-26 DIAGNOSIS — Z0000 Encounter for general adult medical examination without abnormal findings: Secondary | ICD-10-CM | POA: Diagnosis not present

## 2019-03-26 DIAGNOSIS — M4802 Spinal stenosis, cervical region: Secondary | ICD-10-CM | POA: Diagnosis not present

## 2019-03-26 DIAGNOSIS — Z9989 Dependence on other enabling machines and devices: Secondary | ICD-10-CM | POA: Diagnosis not present

## 2019-03-26 DIAGNOSIS — G992 Myelopathy in diseases classified elsewhere: Secondary | ICD-10-CM | POA: Diagnosis not present

## 2019-03-26 DIAGNOSIS — I10 Essential (primary) hypertension: Secondary | ICD-10-CM | POA: Diagnosis not present

## 2019-03-27 ENCOUNTER — Other Ambulatory Visit: Payer: Self-pay | Admitting: Cardiovascular Disease

## 2019-04-06 DIAGNOSIS — Z0000 Encounter for general adult medical examination without abnormal findings: Secondary | ICD-10-CM | POA: Diagnosis not present

## 2019-04-16 ENCOUNTER — Telehealth: Payer: Self-pay | Admitting: Cardiovascular Disease

## 2019-04-16 NOTE — Telephone Encounter (Signed)
Scheduled patient for 12/15 tg at 8 am.  Patient ok to cancel if clinical decides she doesn't need.

## 2019-04-16 NOTE — Telephone Encounter (Signed)
Pt c/o BP issue: STAT if pt c/o blurred vision, one-sided weakness or slurred speech  1. What are your last 5 BP readings?    140-150 trending now   200's at high points     2. Are you having any other symptoms (ex. Dizziness, headache, blurred vision, passed out)? No but can tell and feels unsettled when elevated   3. What is your BP issue? Patient states she still takes carvedilol  losartan and hydralazine and fills like she may need some medication adjustment for trending htn

## 2019-04-16 NOTE — Telephone Encounter (Signed)
I spoke with the patient. She states that her BP has been running [id number] systolic for the last few weeks. She mostly notices this at night or after office hours. She will occasionally run up to [id number] systolic, but will only do this for brief periods. She states nothing has changed for her such as her diet/ stress, so she cannot understand why her BP is running high. For the most part she feels ok, but when her numbers start to trend up, she feels "unsettled/ chilled." I asked what are numbers are when she feels like this.  She states her SBP will be 150+.  Confirmed with the patient that she is taking: 1) Coreg 25 mg BID 2) Hydralazine 25 mg- 1.5 tablets (37.5 mg) by mouth once daily at bedtime 3) Losartan 100 mg- 1 tablet once daily at 3 pm 4) Torsemide 10 mg- 1/2 tablet (5 mg) once daily  The patient reports she typically takes her BP at 9 am (prior to meds)/ 3 pm (prior to meds)/ & bedtime (prior to meds). She will occasionally check her BP after her AM coreg dose and her SBP will be 110-120, but prior to lunch she is back up to 140.  I advised the patient I will need to review with Dr. Rockey Situ APP for further advisement and call her back with recommendations. She is currently scheduled for an appt on 12/15 with Dr. Rockey Situ and will cancel this if he feels her issue can be managed over the phone.

## 2019-04-16 NOTE — Telephone Encounter (Signed)
We need BP after medications  Before helpful, but she reports a large drop after meds. It has been a while since last seen 09/2018 Perhaps she can measure BP 2 hours after meds. She is welcome to push back visit 04/21/19 to get more numbers if she would like

## 2019-04-17 NOTE — Telephone Encounter (Signed)
Spoke with patient and reviewed recommendations by provider to monitor blood pressures 2 hours after medications and keep a log of those readings. Reviewed that she could push appointment out further to collect more readings to see if medications are helping. She was agreeable stating that would give her time to see how those readings look. Instructed her to keep a log of those readings and I would have scheduling call to reschedule her appointment. She verbalized understanding of our conversation, agreement with plan, and had no further questions at this time.

## 2019-04-19 NOTE — Telephone Encounter (Signed)
She needs follow fu appt For now would start cardura 1 mg daily, add early in the Am

## 2019-04-20 ENCOUNTER — Other Ambulatory Visit: Payer: Self-pay | Admitting: *Deleted

## 2019-04-20 ENCOUNTER — Ambulatory Visit: Payer: PPO | Admitting: Cardiovascular Disease

## 2019-04-20 MED ORDER — DOXAZOSIN MESYLATE 1 MG PO TABS: 1.0000 mg | ORAL_TABLET | Freq: Every day | ORAL | 3 refills | Status: DC

## 2019-04-20 NOTE — Telephone Encounter (Signed)
Rescheduled

## 2019-04-21 ENCOUNTER — Ambulatory Visit: Payer: PPO | Admitting: Cardiovascular Disease

## 2019-04-22 ENCOUNTER — Ambulatory Visit: Payer: PPO | Admitting: Nurse Practitioner

## 2019-04-22 ENCOUNTER — Encounter: Payer: Self-pay | Admitting: Nurse Practitioner

## 2019-04-22 ENCOUNTER — Other Ambulatory Visit: Payer: Self-pay

## 2019-04-22 VITALS — BP 160/90 | HR 75 | Ht 63.0 in | Wt 154.2 lb

## 2019-04-22 DIAGNOSIS — R0989 Other specified symptoms and signs involving the circulatory and respiratory systems: Secondary | ICD-10-CM

## 2019-04-22 MED ORDER — HYDRALAZINE HCL 25 MG PO TABS: 25.0000 mg | ORAL_TABLET | Freq: Two times a day (BID) | ORAL | 3 refills | Status: DC

## 2019-04-22 NOTE — Patient Instructions (Signed)
Medication Instructions:  1- STOP Cardura 2- INCREASE Hydralazine 1 tablet (25 mg total) twice daily *If you need a refill on your cardiac medications before your next appointment, please call your pharmacy*  Lab Work: None ordered  If you have labs (blood work) drawn today and your tests are completely normal, you will receive your results only by: Marland Kitchen MyChart Message (if you have MyChart) OR . A paper copy in the mail If you have any lab test that is abnormal or we need to change your treatment, we will call you to review the results.  Testing/Procedures: None ordered   Follow-Up: At [facility], you and your health needs are our priority.  As part of our continuing mission to provide you with exceptional heart care, we have created designated Provider Care Teams.  These Care Teams include your primary Cardiologist (physician) and Advanced Practice Providers (APPs -  Physician Assistants and Nurse Practitioners) who all work together to provide you with the care you need, when you need it.  Your next appointment:   3 month(s)  The format for your next appointment:   In Person  Provider:    You may see Ida Rogue, MD or Murray Hodgkins, NP.

## 2019-04-22 NOTE — Progress Notes (Signed)
Office Visit    Patient Name: Ciaran Imdieke Date of Encounter: 04/22/2019  Primary Care Provider:  Kirk Ruths, MD Primary Cardiologist:  Ida Rogue, MD  Chief Complaint    78 year old female with a history of labile hypertension, Ethelene Hal, chest pain, and spinal stenosis, who presents for follow-up secondary to hypertension.  Past Medical History    Past Medical History:  Diagnosis Date  . Chest pain    a. 10/2014 St Echo: nl LV fxn, no wma/ischemia.  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 Hypertension   . Spinal stenosis    Past Surgical History:  Procedure Laterality Date  . ABDOMINAL HYSTERECTOMY    . AUGMENTATION MAMMAPLASTY  2006  . BACK SURGERY    . LUMBAR DISC SURGERY      Allergies  Allergies  Allergen Reactions  . Azithromycin Hives and Rash  . Amlodipine Swelling    History of Present Illness    78 year old female with the above past medical history including hypertension, chest pain with prior negative stress echo in 2016, anxiety, and Ethelene Hal.  She has a long history of labile hypertension which has generated anxiety in the past.  She was last seen via telemedicine visit in May of this year at which time she reported relatively stable blood pressures on carvedilol, losartan, and once daily hydralazine.  She was also using amlodipine on an as-needed basis at that time however in the setting of lower extremity swelling, this was held.  Recently, she has noted recurrent spikes of blood pressure, typically occurring in the late afternoon to early evening.  Her pressures throughout the day will typically trend in the 130s to 150s however, she has noted that sometimes she will develop chills in the late afternoon to early evening and this causes her to check her blood pressure.  Pressures at that time have been as high as the 190s.  She called the office recently to report this and was advised to start doxazosin 1 mg daily.  She  has not noticed any significant change since starting this.  Her current regimen includes carvedilol 25 mg twice daily, Cardura 1 mg daily-take in the morning, hydralazine 37.5 mg-taken at bedtime, losartan 100 mg-taken at 3 PM, and torsemide 5 mg-taken in the morning.  She denies chest pain, dyspnea, palpitations, PND, orthopnea, dizziness, syncope, edema, or early satiety.  She does have chronic low back pain with lower extremity neuropathy.  We talked a little bit about her lower extremity pain which she actually describes as a heaviness in her calves that occurs simply with standing but will also occur with ambulation.  She is being worked up for neuropathy and spinal stenosis at [facility] with recent MRI showing significant lumbar disc disease.  Home Medications    Prior to Admission medications   Medication Sig Start Date End Date Taking? Authorizing Provider  carvedilol (COREG) 25 MG tablet Take 1 tablet (25 mg total) by mouth 2 (two) times daily. 09/16/18 12/15/18  Minna Merritts, MD  doxazosin (CARDURA) 1 MG tablet Take 1 tablet (1 mg total) by mouth daily. 04/20/19   Minna Merritts, MD  estradiol (ESTRACE) 1 MG tablet Take 1 mg by mouth daily.    [provider]  hydrALAZINE (APRESOLINE) 25 MG tablet TAKE ONE AND A HALF TABLETS (37.5MG ) BY MOUTH once daily in the evening 07/17/18   Dunn, Areta Haber, PA-C  losartan (COZAAR) 100 MG tablet Take 100 mg by mouth  daily.    [provider]  nitroGLYCERIN (NITROSTAT) 0.4 MG SL tablet PLACE 1 TABLET UNDER TONGUE EVERY 5 MIN AS NEEDED FOR CHEST PAIN IF NO RELIEF IN15 MIN CALL 911 (MAX 3 TABS) 03/27/19   Gollan, Kathlene November, MD  omeprazole (PRILOSEC) 40 MG capsule Take 40 mg by mouth daily. 08/24/15   [provider]  torsemide (DEMADEX) 5 MG tablet Take 1 tablet (5 mg total) by mouth daily. 07/17/18   Rise Mu, PA-C    Review of Systems    Labile blood pressures with spikes often being preceded by feeling chills.  Low  back pain and lower extremity neuropathy/pain and heaviness of her legs.  She denies chest pain, dyspnea, palpitations, PND, orthopnea, dizziness, syncope, edema, or early satiety.  All other systems reviewed and are otherwise negative except as noted above.  Physical Exam    VS:  BP (!) 160/90 (BP Location: Left Arm, Patient Position: Sitting, Cuff Size: Normal)   Pulse 75   Ht 5\' 3"  (1.6 m)   Wt 154 lb 4 oz (70 kg)   SpO2 98%   BMI 27.32 kg/m  , BMI Body mass index is 27.32 kg/m. GEN: Well nourished, well developed, in no acute distress. HEENT: normal. Neck: Supple, no JVD, carotid bruits, or masses. Cardiac: RRR, no murmurs, rubs, or gallops. No clubbing, cyanosis, edema.  Radials/DP/PT 2+ and equal bilaterally.  Respiratory:  Respirations regular and unlabored, clear to auscultation bilaterally. GI: Soft, nontender, nondistended, BS + x 4. MS: no deformity or atrophy. Skin: warm and dry, no rash. Neuro:  Strength and sensation are intact. Psych: Normal affect.  Accessory Clinical Findings    ECG personally reviewed by me today -regular sinus rhythm, 75 - no acute changes.  Outside labs dated March 19, 2019 Sodium 136, potassium 4.4, chloride 101, CO2 29, BUN 18, creatinine 0.8, glucose 97 Total protein 5.6, albumin 3.8, total bilirubin 0.6, alkaline phosphatase 58, AST 17, ALT 12 Total cholesterol 231, triglycerides 90, HDL 68.7, LDL 144  Assessment & Plan    1.  Labile hypertension: Patient with a long history of labile hypertension with multiple medical adjustments in the past.  Historically, spikes in blood pressure have been at least somewhat associated with anxiety or at least have been anxiety producing.  She has recently again noticed higher blood pressure trends, typically in the 130s to 150s with spikes in the late afternoon.  Reviewing her medications, she is taking hydralazine 37.5 mg in the evening only.  After taking her carvedilol and torsemide first thing in  the morning, pressure typically comes down to normal range.  At 3 PM, she takes losartan 100 mg and then by 6 PM, pressures are beginning to elevate into the 170s+.  Cardura was recently added and she has not noticed any significant change following this addition.  We discussed options for management today.  I suspect, that she is experiencing rebound hypertension in the afternoon in the setting of once daily hydralazine.  We collectively decided to hold off on her Cardura for right now and instead, she will begin taking hydralazine 25 mg twice daily.  She prefers to take a dose at noon and then another dose at bedtime.  It may very well be that she will require either 50 twice daily or 25 mg, 3 times daily.  She will follow her blood pressure daily for the next week and send [place] a MyChart message with trends, at which time I can make an  adjustment if necessary.  2.  Disposition: Patient will contact us within the next week regarding blood pressures.  Otherwise, plan to follow-up in 1 month or sooner if necessary.   Murray Hodgkins, NP 04/22/2019, 3:52 PM

## 2019-04-27 ENCOUNTER — Other Ambulatory Visit: Payer: Self-pay | Admitting: Cardiovascular Disease

## 2019-04-29 NOTE — Telephone Encounter (Signed)
Per patient call to confirm msg received be sent to triage

## 2019-05-04 MED ORDER — CARVEDILOL 25 MG PO TABS: ORAL_TABLET | ORAL | 1 refills | Status: DC

## 2019-05-04 NOTE — Telephone Encounter (Signed)
"  Overall, it looks like numbers are better throughout the day however, morning low bp's are somewhat concerning. If she is not symptomatic w/ pressures in the 80's to 90's, then I would leave everything as is. If however, she is symptomatic, her AM dose of carvedilol should be dropped to 12.5mg  (leave PM  dose @ 25mg )." [name] Bayley, NP  Call to patient to discuss POC from [name] Bayley, NP. She reports having some dizziness in the am with low BP values.  She decided 1 week ago that she would start taking coreg 0.5 tablet (12.5 mg total) in the morning and 1 tablet (25 mg total) in the afternoon and reports improvement in BP values and sx.   Pt will continue taking as reported for 1 week and will send mychart message in 1 week with updated vs.   Advised pt to call for any further questions or concerns.

## 2019-05-12 ENCOUNTER — Encounter: Payer: Self-pay | Admitting: Physical Therapy

## 2019-05-12 ENCOUNTER — Ambulatory Visit: Payer: PPO | Attending: Pediatrics

## 2019-05-12 ENCOUNTER — Other Ambulatory Visit: Payer: Self-pay

## 2019-05-12 DIAGNOSIS — R269 Unspecified abnormalities of gait and mobility: Secondary | ICD-10-CM

## 2019-05-12 DIAGNOSIS — Z7409 Other reduced mobility: Secondary | ICD-10-CM | POA: Insufficient documentation

## 2019-05-12 DIAGNOSIS — M6281 Muscle weakness (generalized): Secondary | ICD-10-CM | POA: Diagnosis not present

## 2019-05-12 NOTE — Therapy (Signed)
[place] MAIN [facility] SERVICES [address] [place], [place], [address] Phone: [phone]   Fax:  [phone]  Physical Therapy Evaluation  Patient Details  Name: [name] Woods MRN: [id number] Date of Birth: 1941-01-31 No data recorded  Encounter Date: 05/12/2019  PT End of Session - 05/12/19 1538    Visit Number  --    PT Start Time  1300    PT Stop Time  1400    PT Time Calculation (min)  60 min    Equipment Utilized During Treatment  Gait belt    Activity Tolerance  Patient tolerated treatment well    Behavior During Therapy  WFL for tasks assessed/performed       Past Medical History:  Diagnosis Date  . Chest pain    a. 10/2014 St Echo: nl LV fxn, no wma/ischemia.  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 Guillain Barr syndrome ([place]) 1980  . Hypertension   . Spinal stenosis     Past Surgical History:  Procedure Laterality Date  . ABDOMINAL HYSTERECTOMY    . AUGMENTATION MAMMAPLASTY  2006  . BACK SURGERY    . CERVICAL SPINE SURGERY N/A 2016   for myelopathy  . LUMBAR DISC SURGERY      There were no vitals filed for this visit.   Subjective Assessment - 05/12/19 1309    Subjective  pt reports decreased balance and mobility.  Pt also reports knee pain that radiates down posterior leg/calf area 0/10 currently, but up to 5/10 when standing (associated with the spine)- it worsens with standing prolonged amounts of time.  She has had 2 major falls in the past year- both occurred at home without injury, but she fell when she turned directions and she caught her R foot and stumbled. She required assistance from family to get back up.  She is having difficulty getting up and down from a chair, difficulty getting up from the car if she has been in car for awhile. She has to use railings when going up/down stairs.    Pertinent History  Pt reports extensive spine history (lumbar and cervical surgeries) and neurologic history.  She  also has h/o Media planner.  She has noticed a decline in mobility, strength, walking tolerance, and balance since her most recent spine surgery Dec 2019.    Limitations  Standing;Walking;House hold activities    How long can you sit comfortably?  not limited    How long can you stand comfortably?  <10 minutes    How long can you walk comfortably?  <10 minutes and uses cane or rolling walker whenever she is out of the house    Patient Stated Goals  to improve her walking, balance, and mobility    Currently in Pain?  No/denies      Objective Measures:   5x STS: 17 sec, pt used b/l UE support TUG: 16 sec, PT SBA with gait belt, pt used SPC 10 meter walk test: with PT SBA with gait belt, pt used SPC  Stair/curb navigation: pt able to ascend/descend 6 inch stairs x4 with b/l UE support using reciprocal gait pattern  ROM: Ankle DF AROM: R 5 degrees, L 7 degrees  Strength: (MMT) Ankle DF R 4-/5, L 4/5 Knee extension: R 4/5, L 4/5 Hip extension: R 3/5, L 3/5  Gait: pt amb with SPC in clinic, deviated laterally from straight path while amb on carpet, occasionally "catch" R toes during swing phase  Objective measurements completed on examination: See above findings.              PT Education - 05/12/19 1537    Education Details  PT POC and goals, emphasized pt safety with using her [place] as AD at home    Person(s) Educated  Patient    Methods  Explanation;Demonstration    Comprehension  Verbalized understanding;Returned demonstration       PT Short Term Goals - 05/12/19 1549      PT SHORT TERM GOAL #1   Title  Pt will demonstrate ability to perform a HEP to address standing balance and strength deficits with proper tecnique    Time  3    Period  Weeks    Status  New        PT Long Term Goals - 05/12/19 1552      PT LONG TERM GOAL #1   Title  Pt will perform STS in <15 sec indicating decreased risk for falls    Baseline  05/12/19: today 17 seconds (pt used  b/l UE support)    Time  8    Period  Weeks    Status  New      PT LONG TERM GOAL #2   Title  Pt will perform TUG in <14 sec indicates increased risk for falls    Baseline  05/12/19: today 16 sec    Time  8    Status  New      PT LONG TERM GOAL #3   Title  Pt will be able to ascend/descend curb without loss of balance x 5    Baseline  05/12/19: pt requires b/l UE support for step up and down today with PT SBA             Plan - 05/12/19 1542    Clinical Impression Statement  Pt is a pleasant patient with c/o falls, balance difficulty, and muscle weakness impairing gait.  Pt currently presents with significant balance deficits, muscle weakness, decreased gait speed, and impaired safety awareness. Pt would benefit from skilled PT to address these deficits and decrease risk of falls.    Personal Factors and Comorbidities  Age;Past/Current Experience;Comorbidity 3+    Examination-Activity Limitations  Bend;Stand;Stairs;Squat;Locomotion Level    Examination-Participation Restrictions  Cleaning;Community Activity;Laundry;Meal Prep    Stability/Clinical Decision Making  Evolving/Moderate complexity    Clinical Decision Making  Moderate    Rehab Potential  Good    PT Frequency  2x / week    PT Duration  8 weeks    PT Treatment/Interventions  ADLs/Self Care Home Management;Gait training;Functional mobility training;Therapeutic activities;Therapeutic exercise;Balance training;Neuromuscular re-education;Manual techniques;Patient/family education;Passive range of motion    PT Next Visit Plan  6 min walk test, standing balance/strength interventions    PT Home Exercise Plan  HEP today: sit to stand 2x5 focus on reducing UE support, twice/day    Consulted and Agree with Plan of Care  Patient       Patient will benefit from skilled therapeutic intervention in order to improve the following deficits and impairments:  Abnormal gait, Difficulty walking, Decreased activity tolerance, Decreased  balance, Decreased mobility, Decreased strength  Visit Diagnosis: Impaired mobility  Altered gait  Muscle weakness (generalized)     Problem List Patient Active Problem List   Diagnosis Date Noted  . Labile hypertension 09/16/2018  . Atypical chest pain 09/16/2018  . Chills 07/11/2017  . Essential hypertension 08/03/2016  . SOB (shortness of breath) 08/03/2016  . Anxiety  08/03/2016  . Leg edema 08/03/2016    Pincus Badder 05/12/2019, 4:28 PM  Merdis Delay, PT, DPT Physical Therapist - [facility]  Outpatient Physical Therapy- [place] [facility] MAIN [facility] SERVICES [address] [place], [place], [address] Phone: [phone]   Fax:  [phone]  Name: [name] Woods MRN: [id number] Date of Birth: 05-09-1940

## 2019-05-20 ENCOUNTER — Encounter: Payer: Self-pay | Admitting: Physical Therapy

## 2019-05-20 ENCOUNTER — Other Ambulatory Visit: Payer: Self-pay

## 2019-05-20 ENCOUNTER — Ambulatory Visit: Payer: PPO | Admitting: Physical Therapy

## 2019-05-20 DIAGNOSIS — Z7409 Other reduced mobility: Secondary | ICD-10-CM | POA: Diagnosis not present

## 2019-05-20 DIAGNOSIS — M6281 Muscle weakness (generalized): Secondary | ICD-10-CM

## 2019-05-20 DIAGNOSIS — R269 Unspecified abnormalities of gait and mobility: Secondary | ICD-10-CM

## 2019-05-20 NOTE — Therapy (Signed)
[place] MAIN [facility] SERVICES [address] [place], [place], [address] Phone: [phone]   Fax:  [phone]  Physical Therapy Treatment  Patient Details  Name: [name] Woods MRN: [id number] Date of Birth: 07-04-1940 No data recorded  Encounter Date: 05/20/2019  PT End of Session - 05/20/19 1105    Visit Number  2    Number of Visits  17    Date for PT Re-Evaluation  06/30/19    PT Start Time  1100    PT Stop Time  1140    PT Time Calculation (min)  40 min    Equipment Utilized During Treatment  Gait belt    Activity Tolerance  Patient tolerated treatment well    Behavior During Therapy  WFL for tasks assessed/performed       Past Medical History:  Diagnosis Date  . Chest pain    a. 10/2014 St Echo: nl LV fxn, no wma/ischemia.  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 Guillain Barr syndrome ([place]) 1980  . Hypertension   . Spinal stenosis     Past Surgical History:  Procedure Laterality Date  . ABDOMINAL HYSTERECTOMY    . AUGMENTATION MAMMAPLASTY  2006  . BACK SURGERY    . CERVICAL SPINE SURGERY N/A 2016   for myelopathy  . LUMBAR DISC SURGERY      There were no vitals filed for this visit.  Subjective Assessment - 05/20/19 1104    Subjective  Patient arrives with no pain in sitting today and reports that it will increase if she stands up or walks.    Pertinent History  Pt reports extensive lumbar spine (surgeries) and neurologic history.  She also has h/o Media planner.  She has noticed a decline in mobility, strength, walking tolerance, and balance since her most recent spine surgery Dec 2019.    Limitations  Standing;Walking;House hold activities    How long can you sit comfortably?  not limited    How long can you stand comfortably?  <10 minutes    How long can you walk comfortably?  <10 minutes and uses cane or rolling walker whenever she is out of the house    Patient Stated Goals  to improve her walking, balance,  and mobility    Currently in Pain?  No/denies       Treatment   Therapeutic exercise: Nu-step x 5 mins L 2 Supine:  Hookling marching with TA x  2 mins   Hooklying abd/ER , RTB x 40  Hip abd/add x 15, BLE, cues not to cue out Heel slides x 15, BLE  Sidelying: Hip abd x 15 , BLE, cues not to toe out     Pt educated throughout session about proper posture and technique with exercises. Improved exercise technique, movement at target joints, use of target muscles after min to mod verbal, visual, tactile cues.                     PT Education - 05/20/19 1104    Education Details  HEP    Person(s) Educated  Patient    Methods  Explanation    Comprehension  Verbalized understanding;Returned demonstration;Verbal cues required;Tactile cues required       PT Short Term Goals - 05/12/19 1549      PT SHORT TERM GOAL #1   Title  Pt will demonstrate ability to perform a HEP to address standing balance and strength deficits with proper tecnique  Time  3    Period  Weeks    Status  New        PT Long Term Goals - 05/12/19 1552      PT LONG TERM GOAL #1   Title  Pt will perform STS in <15 sec indicating decreased risk for falls    Baseline  05/12/19: today 17 seconds (pt used b/l UE support)    Time  8    Period  Weeks    Status  New    Target Date  06/30/19      PT LONG TERM GOAL #2   Title  Pt will perform TUG in <14 sec indicates increased risk for falls    Baseline  05/12/19: today 16 sec    Time  8    Period  Weeks    Status  New    Target Date  06/30/19      PT LONG TERM GOAL #3   Title  Pt will be able to ascend/descend curb without loss of balance x 5    Baseline  05/12/19: pt requires b/l UE support for step up and down today with PT SBA    Time  8    Period  Weeks    Status  New    Target Date  06/30/19            Plan - 05/20/19 1105    Clinical Impression Statement Patient instructed in intermediate strengthening for core and B  hips.  Patient requires min Vcs for correct exercise technique  Patient would benefit from additional skilled PT intervention to improve balance/gait safety and reduce fall risk.   Personal Factors and Comorbidities  Age;Past/Current Experience;Comorbidity 3+    Examination-Activity Limitations  Bend;Stand;Stairs;Squat;Locomotion Level    Examination-Participation Restrictions  Cleaning;Community Activity;Laundry;Meal Prep    Stability/Clinical Decision Making  Evolving/Moderate complexity    Rehab Potential  Good    PT Frequency  2x / week    PT Duration  8 weeks    PT Treatment/Interventions  ADLs/Self Care Home Management;Gait training;Functional mobility training;Therapeutic activities;Therapeutic exercise;Balance training;Neuromuscular re-education;Manual techniques;Patient/family education;Passive range of motion    PT Next Visit Plan  6 min walk test, standing balance/strength interventions    PT Home Exercise Plan  HEP today: sit to stand 2x5 focus on reducing UE support, twice/day    Consulted and Agree with Plan of Care  Patient       Patient will benefit from skilled therapeutic intervention in order to improve the following deficits and impairments:  Abnormal gait, Difficulty walking, Decreased activity tolerance, Decreased balance, Decreased mobility, Decreased strength  Visit Diagnosis: Impaired mobility  Altered gait  Muscle weakness (generalized)     Problem List Patient Active Problem List   Diagnosis Date Noted  . Labile hypertension 09/16/2018  . Atypical chest pain 09/16/2018  . Chills 07/11/2017  . Essential hypertension 08/03/2016  . SOB (shortness of breath) 08/03/2016  . Anxiety 08/03/2016  . Leg edema 08/03/2016    Alanson Puls, [place] DPT 05/20/2019, 11:06 AM  [place] MAIN [facility] SERVICES [address] [place], [place], [address] Phone: [phone]   Fax:  [phone]  Name: Maninder Esteve MRN: [id number] Date of Birth: May 09, 1940

## 2019-05-26 ENCOUNTER — Ambulatory Visit: Payer: PPO | Admitting: Cardiovascular Disease

## 2019-05-27 ENCOUNTER — Telehealth: Payer: Self-pay | Admitting: Nurse Practitioner

## 2019-05-27 ENCOUNTER — Encounter: Payer: Self-pay | Admitting: Physical Therapy

## 2019-05-27 ENCOUNTER — Other Ambulatory Visit: Payer: Self-pay

## 2019-05-27 ENCOUNTER — Ambulatory Visit: Payer: PPO | Admitting: Physical Therapy

## 2019-05-27 DIAGNOSIS — Z7409 Other reduced mobility: Secondary | ICD-10-CM

## 2019-05-27 DIAGNOSIS — R269 Unspecified abnormalities of gait and mobility: Secondary | ICD-10-CM

## 2019-05-27 DIAGNOSIS — M6281 Muscle weakness (generalized): Secondary | ICD-10-CM

## 2019-05-27 NOTE — Therapy (Signed)
[place] MAIN [facility] SERVICES [address] [place], [place], [address] Phone: [phone]   Fax:  [phone]  Physical Therapy Treatment  Patient Details  Name: [name] Woods MRN: [id number] Date of Birth: 06-05-1940 No data recorded  Encounter Date: 05/27/2019  PT End of Session - 05/27/19 1102    Visit Number  3    Number of Visits  17    Date for PT Re-Evaluation  06/30/19    PT Start Time  1100    PT Stop Time  1140    PT Time Calculation (min)  40 min    Equipment Utilized During Treatment  Gait belt    Activity Tolerance  Patient tolerated treatment well    Behavior During Therapy  WFL for tasks assessed/performed       Past Medical History:  Diagnosis Date  . Chest pain    a. 10/2014 St Echo: nl LV fxn, no wma/ischemia.  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 Guillain Barr syndrome ([place]) 1980  . Hypertension   . Spinal stenosis     Past Surgical History:  Procedure Laterality Date  . ABDOMINAL HYSTERECTOMY    . AUGMENTATION MAMMAPLASTY  2006  . BACK SURGERY    . CERVICAL SPINE SURGERY N/A 2016   for myelopathy  . LUMBAR DISC SURGERY      There were no vitals filed for this visit.  Subjective Assessment - 05/27/19 1101    Subjective  Patient arrives with no pain in sitting today and reports that it will increase if she stands up or walks.    Pertinent History  Pt reports extensive lumbar spine (surgeries) and neurologic history.  She also has h/o Media planner.  She has noticed a decline in mobility, strength, walking tolerance, and balance since her most recent spine surgery Dec 2019.    Limitations  Standing;Walking;House hold activities    How long can you sit comfortably?  not limited    How long can you stand comfortably?  <10 minutes    How long can you walk comfortably?  <10 minutes and uses cane or rolling walker whenever she is out of the house    Patient Stated Goals  to improve her walking, balance,  and mobility    Currently in Pain?  Yes    Pain Score  5     Pain Location  Knee    Pain Orientation  Left;Right    Pain Descriptors / Indicators  Aching    Pain Type  Chronic pain    Aggravating Factors   walking    Pain Relieving Factors  rest    Multiple Pain Sites  No         Therapeutic exercise: Nu-stepx 5 mins L 1 Supine: SLR x 15 BLE Hookling marching x 15  Hooklying abd/ER x 15  Bridging x 15 SAQ x 15 BLE Hip abd/add x 15, BLE Heel slides x 15, BLE  Sidelying: Hip abd x 15 , BLE  Patient performed with instruction, verbal cues, tactile cues of therapist: goal: increase tissue extensibility, promote proper posture, improve mobility                        PT Education - 05/27/19 1102    Education Details  HEP    Person(s) Educated  Patient    Methods  Explanation    Comprehension  Verbalized understanding;Returned demonstration       PT  Short Term Goals - 05/12/19 1549      PT SHORT TERM GOAL #1   Title  Pt will demonstrate ability to perform a HEP to address standing balance and strength deficits with proper tecnique    Time  3    Period  Weeks    Status  New        PT Long Term Goals - 05/12/19 1552      PT LONG TERM GOAL #1   Title  Pt will perform STS in <15 sec indicating decreased risk for falls    Baseline  05/12/19: today 17 seconds (pt used b/l UE support)    Time  8    Period  Weeks    Status  New    Target Date  06/30/19      PT LONG TERM GOAL #2   Title  Pt will perform TUG in <14 sec indicates increased risk for falls    Baseline  05/12/19: today 16 sec    Time  8    Period  Weeks    Status  New    Target Date  06/30/19      PT LONG TERM GOAL #3   Title  Pt will be able to ascend/descend curb without loss of balance x 5    Baseline  05/12/19: pt requires b/l UE support for step up and down today with PT SBA    Time  8    Period  Weeks    Status  New    Target Date  06/30/19            Plan - 05/27/19  1103    Clinical Impression Statement  p    Personal Factors and Comorbidities  Age;Past/Current Experience;Comorbidity 3+    Examination-Activity Limitations  Bend;Stand;Stairs;Squat;Locomotion Level    Examination-Participation Restrictions  Cleaning;Community Activity;Laundry;Meal Prep    Stability/Clinical Decision Making  Evolving/Moderate complexity    Rehab Potential  Good    PT Frequency  2x / week    PT Duration  8 weeks    PT Treatment/Interventions  ADLs/Self Care Home Management;Gait training;Functional mobility training;Therapeutic activities;Therapeutic exercise;Balance training;Neuromuscular re-education;Manual techniques;Patient/family education;Passive range of motion    PT Next Visit Plan  6 min walk test, standing balance/strength interventions    PT Home Exercise Plan  HEP today: sit to stand 2x5 focus on reducing UE support, twice/day    Consulted and Agree with Plan of Care  Patient       Patient will benefit from skilled therapeutic intervention in order to improve the following deficits and impairments:  Abnormal gait, Difficulty walking, Decreased activity tolerance, Decreased balance, Decreased mobility, Decreased strength  Visit Diagnosis: Impaired mobility  Altered gait  Muscle weakness (generalized)     Problem List Patient Active Problem List   Diagnosis Date Noted  . Labile hypertension 09/16/2018  . Atypical chest pain 09/16/2018  . Chills 07/11/2017  . Essential hypertension 08/03/2016  . SOB (shortness of breath) 08/03/2016  . Anxiety 08/03/2016  . Leg edema 08/03/2016    Alanson Puls, [place] DPT 05/27/2019, 11:08 AM  [place] MAIN [facility] SERVICES [address] [place], [place], [address] Phone: [phone]   Fax:  [phone]  Name: [name] Woods MRN: [id number] Date of Birth: 06-01-1940

## 2019-05-27 NOTE — Telephone Encounter (Signed)
   Patient called this evening because of elevated blood pressures throughout the day. She normally takes carvedilol 25 mg twice daily, hydralazine 25 mg twice daily, and losartan 100 mg at 3 PM. When pressures elevate during the day, she will take an additional hydralazine 25 mg around lunchtime as needed. Today, blood pressures have been running in the 170s for most of the day. She has been checking her blood pressure very frequently throughout the day. In addition to taking the extra hydralazine, she has also taken an old amlodipine tablet, an additional half of carvedilol, and a sublingual nitroglycerin. Most recent blood pressure was [id number] systolic. She says she recently got the [place] COVID-19 vaccine and just feels a little bit off/fatigued today and wonders if maybe that is what is driving up her blood pressure. Advised that she should just go ahead and take her evening doses of carvedilol 25 mg and hydralazine 25 mg and likely should not check her blood pressure again tonight as I suspect anxiety may be playing some role in elevating her blood pressure further.  Caller verbalized understanding and was grateful for the call back.  Murray Hodgkins, NP 05/27/2019, 7:45 PM

## 2019-06-02 ENCOUNTER — Ambulatory Visit: Payer: PPO | Admitting: Physical Therapy

## 2019-06-04 ENCOUNTER — Ambulatory Visit: Payer: PPO | Admitting: Physical Therapy

## 2019-06-08 ENCOUNTER — Ambulatory Visit: Payer: PPO | Admitting: Physical Therapy

## 2019-06-10 ENCOUNTER — Ambulatory Visit: Payer: PPO | Admitting: Physical Therapy

## 2019-06-15 ENCOUNTER — Ambulatory Visit: Payer: PPO | Admitting: Physical Therapy

## 2019-06-17 ENCOUNTER — Ambulatory Visit: Payer: PPO | Admitting: Physical Therapy

## 2019-06-19 ENCOUNTER — Telehealth: Payer: Self-pay | Admitting: Cardiovascular Disease

## 2019-06-19 MED ORDER — HYDRALAZINE HCL 25 MG PO TABS: 50.0000 mg | ORAL_TABLET | Freq: Three times a day (TID) | ORAL | 3 refills | Status: DC

## 2019-06-19 MED ORDER — CARVEDILOL 25 MG PO TABS: 25.0000 mg | ORAL_TABLET | Freq: Two times a day (BID) | ORAL | 1 refills | Status: DC

## 2019-06-19 NOTE — Telephone Encounter (Signed)
Recommend increasing Hydralazine to 25mg  three times daily. Check blood pressure once daily at least one hour after medications. Write down BP each day. Report blood pressures via telephone or MyChart on Wednesday of this upcoming week.  Suggested medication schedule (to keep doses approximately 8 hours apart).   6am - Coreg and Hydralazine 2pm - Losartan and Hydralazine 10pm - Coreg and Hydralazine  Loel Dubonnet, NP

## 2019-06-19 NOTE — Telephone Encounter (Signed)
Spoke with patient and she reports that she is already taking Hydralazine 3 times a day now. Placed patient on hold and reviewed verbally with provider. Patient is taking Carvedilol 25 mg twice daily, Losartan 100 mg, and Hydralazine 25 mg three times a day. Verbal orders received to increase patients Hydralazine 25 mg to 2 tablets three times a day and to call us back next week with blood pressures. She verbalized understanding of our conversation, read back medications, and had no further questions at this time.

## 2019-06-19 NOTE — Telephone Encounter (Signed)
Pt c/o BP issue: STAT if pt c/o blurred vision, one-sided weakness or slurred speech  1. What are your last 5 BP readings?  175/91 181/96 (after meds) 164/89  2. Are you having any other symptoms (ex. Dizziness, headache, blurred vision, passed out)? No   3. What is your BP issue? Higher than usual even after additional medication

## 2019-06-19 NOTE — Telephone Encounter (Signed)
Spoke with patient and she takes losartan at 2 pm daily. She does take carvedilol and hydralazine twice a day. She states that her blood pressures are still elevated. Advised that I would check with provider here in the office and would give her a call back. She was appreciative for the call with no further questions.

## 2019-06-22 ENCOUNTER — Ambulatory Visit: Payer: PPO | Admitting: Physical Therapy

## 2019-06-24 ENCOUNTER — Ambulatory Visit: Payer: PPO | Admitting: Physical Therapy

## 2019-06-29 ENCOUNTER — Ambulatory Visit: Payer: PPO | Admitting: Physical Therapy

## 2019-07-01 ENCOUNTER — Ambulatory Visit: Payer: PPO | Admitting: Physical Therapy

## 2019-07-06 ENCOUNTER — Ambulatory Visit: Payer: PPO | Admitting: Physical Therapy

## 2019-07-08 ENCOUNTER — Ambulatory Visit: Payer: PPO | Admitting: Physical Therapy

## 2019-07-21 ENCOUNTER — Ambulatory Visit (INDEPENDENT_AMBULATORY_CARE_PROVIDER_SITE_OTHER): Payer: PPO | Admitting: Cardiovascular Disease

## 2019-07-21 ENCOUNTER — Encounter: Payer: Self-pay | Admitting: Cardiovascular Disease

## 2019-07-21 ENCOUNTER — Other Ambulatory Visit: Payer: Self-pay

## 2019-07-21 VITALS — BP 114/60 | HR 77 | Ht 63.0 in | Wt 156.1 lb

## 2019-07-21 DIAGNOSIS — R0789 Other chest pain: Secondary | ICD-10-CM

## 2019-07-21 DIAGNOSIS — R0989 Other specified symptoms and signs involving the circulatory and respiratory systems: Secondary | ICD-10-CM

## 2019-07-21 DIAGNOSIS — R600 Localized edema: Secondary | ICD-10-CM | POA: Diagnosis not present

## 2019-07-21 DIAGNOSIS — R0602 Shortness of breath: Secondary | ICD-10-CM

## 2019-07-21 MED ORDER — CARVEDILOL 25 MG PO TABS: 25.0000 mg | ORAL_TABLET | Freq: Two times a day (BID) | ORAL | 1 refills | Status: DC

## 2019-07-21 MED ORDER — LOSARTAN POTASSIUM 100 MG PO TABS: 100.0000 mg | ORAL_TABLET | Freq: Every day | ORAL | 3 refills | Status: DC

## 2019-07-21 MED ORDER — NITROGLYCERIN 0.4 MG SL SUBL: 0.4000 mg | SUBLINGUAL_TABLET | SUBLINGUAL | 3 refills | Status: DC | PRN

## 2019-07-21 MED ORDER — HYDRALAZINE HCL 50 MG PO TABS: 50.0000 mg | ORAL_TABLET | Freq: Three times a day (TID) | ORAL | 3 refills | Status: DC

## 2019-07-21 NOTE — Patient Instructions (Addendum)

## 2019-07-21 NOTE — Progress Notes (Signed)
Date:  07/21/2019   ID:  [name] Woods, DOB 29-Jul-1940, MRN [id number]  Patient Location:  [facility] [place] [address]   Provider location:   [facility], [place] office  PCP:  Kirk Ruths, MD  Cardiologist:  Arvid Right [facility]   Chief Complaint  Patient presents with  . OTHER    3 month f/u c/o flunctuating BP. Meds reviewed verbally with pt.     History of Present Illness:    [name] Woods is a 79 y.o. female  past medical history of Labile Hypertension, causing significant anxiety in the past  Anxiety Guillain-Barre syndrome  Chest pain Previously seen by [facility] cardiology in 2017 Negative stress test 2016 She presents for follow up of her labile hypertension  Labile blood pressure on her last clinic visit Recommended For high pressure, she take amlodipine or extra hydralazine as needed Was not taking amlodipine on a regular basis She was taking Coreg 25 twice daily losartan 100 with hydralazine in the evening  pressures today [id number] systolic  Rare 90 systolic in the Am Uses less coreg in the Am  BMP reviewed form  PMD  Active  amlodipine held for leg swelling  Rare ankle swelling Take torsemide 5 daily (maybe 2.5)  Periodically takes nitroglycerin  Labs reviewed Total chol 221, LDL 141 Does not want a statin  Previous workup includes Stress echocardiogram 10/28/2014 with normal LV function, no ischemia  Other past medical history includes Spinal stenosis, back pain, Cervical surgery Trouble walking Legs heavy  Mom with dementia, Father with FTT   Prior CV studies:   The following studies were reviewed today:    Past Medical History:  Diagnosis Date  . Chest pain    a. 10/2014 St Echo: nl LV fxn, no wma/ischemia.  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 Guillain Barr syndrome ([place]) 1980  . Hypertension   . Spinal stenosis    Past Surgical History:  Procedure  Laterality Date  . ABDOMINAL HYSTERECTOMY    . AUGMENTATION MAMMAPLASTY  2006  . BACK SURGERY    . CERVICAL SPINE SURGERY N/A 2016   for myelopathy  . LUMBAR DISC SURGERY       Current Meds  Medication Sig  . carvedilol (COREG) 25 MG tablet Take 1 tablet (25 mg total) by mouth 2 (two) times daily with a meal. Take 0.5 tablet (12.5 mg total) in the morning and 1 tablet (25 mg total) in the afternoon.  Mar[place] Kitchen estradiol (ESTRACE) 1 MG tablet Take 1 mg by mouth daily.  . hydrALAZINE (APRESOLINE) 25 MG tablet Take 2 tablets (50 mg total) by mouth 3 (three) times daily.  Mar[place] Kitchen losartan (COZAAR) 100 MG tablet Take 100 mg by mouth daily.  . nitroGLYCERIN (NITROSTAT) 0.4 MG SL tablet PLACE 1 TABLET UNDER TONGUE EVERY 5 MIN AS NEEDED FOR CHEST PAIN IF NO RELIEF IN15 MIN CALL 911 (MAX 3 TABS)  . omeprazole (PRILOSEC) 40 MG capsule Take 40 mg by mouth daily.  [place] Kitchen torsemide (DEMADEX) 5 MG tablet Take 1 tablet (5 mg total) by mouth daily.     Allergies:   Azithromycin and Amlodipine   Social History   Tobacco Use  . Smoking status: Never Smoker  . Smokeless tobacco: Never Used  Substance Use Topics  . Alcohol use: Yes    Comment: occasional  . Drug use: No     Current Outpatient Medications on File Prior to Visit  Medication Sig Dispense Refill  . carvedilol (COREG) 25 MG tablet Take 1 tablet (25 mg total) by mouth 2 (two) times daily with a meal. Take 0.5 tablet (12.5 mg total) in the morning and 1 tablet (25 mg total) in the afternoon. 180 tablet 1  . estradiol (ESTRACE) 1 MG tablet Take 1 mg by mouth daily.    . hydrALAZINE (APRESOLINE) 25 MG tablet Take 2 tablets (50 mg total) by mouth 3 (three) times daily. 180 tablet 3  . losartan (COZAAR) 100 MG tablet Take 100 mg by mouth daily.    . nitroGLYCERIN (NITROSTAT) 0.4 MG SL tablet PLACE 1 TABLET UNDER TONGUE EVERY 5 MIN AS NEEDED FOR CHEST PAIN IF NO RELIEF IN15 MIN CALL 911 (MAX 3 TABS) 25 tablet 0  . omeprazole (PRILOSEC) 40 MG capsule Take 40 mg  by mouth daily.    [place] Kitchen torsemide (DEMADEX) 5 MG tablet Take 1 tablet (5 mg total) by mouth daily. 90 tablet 3   No current facility-administered medications on file prior to visit.     Family Hx: The patient's family history includes Hypertension in her father and mother. There is no history of Breast cancer.  ROS:   Please see the history of present illness.    Review of Systems  Constitutional: Negative.   HENT: Negative.   Respiratory: Negative.   Cardiovascular: Negative.   Gastrointestinal: Negative.   Musculoskeletal: Negative.   Neurological: Negative.   Psychiatric/Behavioral: Negative.   All other systems reviewed and are negative.    Labs/Other Tests and Data Reviewed:    Recent Labs: No results found for requested labs within last 8760 hours.   Recent Lipid Panel No results found for: CHOL, TRIG, HDL, CHOLHDL, LDLCALC, LDLDIRECT  Wt Readings from Last 3 Encounters:  07/21/19 156 lb 2 oz (70.8 kg)  04/22/19 154 lb 4 oz (70 kg)  09/16/18 148 lb (67.1 kg)     Exam:    Vital Signs: Vital signs may also be detailed in the HPI BP 114/60 (BP Location: Left Arm, Patient Position: Sitting, Cuff Size: Normal)   Pulse 77   Ht 5\' 3"  (1.6 m)   Wt 156 lb 2 oz (70.8 kg)   SpO2 98%   BMI 27.66 kg/m    Constitutional:  oriented to person, place, and time. No distress.  HENT:  Head: Grossly normal Eyes:  no discharge. No scleral icterus.  Neck: No JVD, no carotid bruits  Cardiovascular: Regular rate and rhythm, no murmurs appreciated Pulmonary/Chest: Clear to auscultation bilaterally, no wheezes or rails Abdominal: Soft.  no distension.  no tenderness.  Musculoskeletal: Normal range of motion Neurological:  normal muscle tone. Coordination normal. No atrophy Skin: Skin warm and dry Psychiatric: normal affect, pleasant   ASSESSMENT & PLAN:    Labile hypertension taking Coreg 12.5 Am and 25 in PM  losartan 100 with hydralazine 50 BID  SOB (shortness of  breath) walking program  Leg edema Previously had minimal symptoms,  Torsemide low dose daily  Anxiety Periods of anxiety, managed by primary care Overall stable  Atypical chest pain No further episodes   Total encounter time more than 25 minutes  Greater than 50% was spent in counseling and coordination of care with the patient   Disposition: Follow-up in 12 months   Signed, Ida Rogue, MD  07/21/2019 1:56 PM    [place] Office [id number], [place], [place] [address]

## 2019-07-21 NOTE — Addendum Note (Signed)
Addended by: Valora Corporal on: 07/21/2019 02:26 PM   Modules accepted: Orders

## 2019-09-16 DIAGNOSIS — I10 Essential (primary) hypertension: Secondary | ICD-10-CM | POA: Diagnosis not present

## 2019-09-16 DIAGNOSIS — Z9989 Dependence on other enabling machines and devices: Secondary | ICD-10-CM | POA: Diagnosis not present

## 2019-09-22 DIAGNOSIS — L600 Ingrowing nail: Secondary | ICD-10-CM | POA: Diagnosis not present

## 2019-09-22 DIAGNOSIS — M79675 Pain in left toe(s): Secondary | ICD-10-CM | POA: Diagnosis not present

## 2019-09-23 DIAGNOSIS — I10 Essential (primary) hypertension: Secondary | ICD-10-CM | POA: Diagnosis not present

## 2019-09-23 DIAGNOSIS — M4802 Spinal stenosis, cervical region: Secondary | ICD-10-CM | POA: Diagnosis not present

## 2019-09-23 DIAGNOSIS — Z9989 Dependence on other enabling machines and devices: Secondary | ICD-10-CM | POA: Diagnosis not present

## 2019-09-23 DIAGNOSIS — G992 Myelopathy in diseases classified elsewhere: Secondary | ICD-10-CM | POA: Diagnosis not present

## 2019-09-23 DIAGNOSIS — K219 Gastro-esophageal reflux disease without esophagitis: Secondary | ICD-10-CM | POA: Diagnosis not present

## 2019-09-23 DIAGNOSIS — Z634 Disappearance and death of family member: Secondary | ICD-10-CM | POA: Insufficient documentation

## 2019-10-07 NOTE — Progress Notes (Signed)
Cardiology Office Note    Date:  10/14/2019   ID:  [name] Woods, Woods [birth year], MRN [id number]  PCP:  Kirk Ruths, MD  Cardiologist:  Ida Rogue, MD  Electrophysiologist:  None   Chief Complaint: Elevated BP/shortness of breath  History of Present Illness:   [name] Woods is a 79 y.o. female with history of  labile hypertension, Guillain-Barr syndrome, spinal stenosis, chest pain, and anxiety who presents for follow-up of hypertension.  She was previously followed by [facility] cardiology with a negative stress echo in 2016 with normal LV function.  She has a long history of labile hypertension which has generated anxiety previously.  She was last seen in 07/2019 with well-controlled blood pressure and recommendation to take carvedilol 12.5 mg in the morning and 25 mg in the evening along with losartan 100 mg daily, hydralazine 50 mg twice daily, and torsemide 5 mg daily.  Amlodipine has previously been held secondary to lower extremity swelling.  She was last seen by her PCP on 09/23/2019 and was doing well, tolerating medications with well-controlled blood pressure.  She comes in today noting since her husband unexpectedly passed away last month increased grief, stress, and anxiety.  In this setting, she has noted some elevated BP readings as well as some randomly occurring episodes of shortness of breath.  She has attributed these episodes to her underlying stress and anxiety following her husband's passing.  Currently, she is taking Coreg 12.5 mg every morning and 25 mg every afternoon along with losartan 100 mg every morning, torsemide 5 mg every morning, and hydralazine 50 mg twice daily with an additional third dose as needed for elevated BP.  She does note an evening where her blood pressure got into the [id number] systolic causing her to take an additional hydralazine.  No chest pain, palpitations, dizziness, presyncope, syncope lower extremity swelling,  abdominal distention, orthopnea, PND, or early satiety.   Labs independently reviewed: 09/2019 - potassium 4.3, BUN 12, SCr 0.8, albumin 4.2, AST/ALT normal, TC 224, TG 70, HDL 73, LDL 137 10/2016 - TSH normal 03/2015 - HGB 13.7, PLT 304  Past Medical History:  Diagnosis Date  . Chest pain    a. 10/2014 St Echo: nl LV fxn, no wma/ischemia.  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 Guillain Barr syndrome ([place]) 1980  . Hypertension   . Spinal stenosis     Past Surgical History:  Procedure Laterality Date  . ABDOMINAL HYSTERECTOMY    . AUGMENTATION MAMMAPLASTY  2006  . BACK SURGERY    . CERVICAL SPINE SURGERY N/A 2016   for myelopathy  . LUMBAR DISC SURGERY      Current Medications: Current Meds  Medication Sig  . carvedilol (COREG) 25 MG tablet Take 1 tablet (25 mg total) by mouth 2 (two) times daily with a meal. Take 0.5 tablet (12.5 mg total) in the morning and 1 tablet (25 mg total) in the afternoon.  Marland Kitchen estradiol (ESTRACE) 1 MG tablet Take 1 mg by mouth daily.  . hydrALAZINE (APRESOLINE) 50 MG tablet Take 1 tablet (50 mg total) by mouth 3 (three) times daily.  Marland Kitchen losartan (COZAAR) 100 MG tablet Take 1 tablet (100 mg total) by mouth daily.  . nitroGLYCERIN (NITROSTAT) 0.4 MG SL tablet Place 1 tablet (0.4 mg total) under the tongue every 5 (five) minutes as needed for chest pain.  Marland Kitchen omeprazole (PRILOSEC) 40 MG capsule Take 40 mg by mouth daily.  Marland Kitchen torsemide (DEMADEX) 5 MG tablet  Take 1 tablet (5 mg total) by mouth daily.    Allergies:   Azithromycin and Amlodipine   Social History   Socioeconomic History  . Marital status: Married    Spouse name: Not on file  . Number of children: Not on file  . Years of education: Not on file  . Highest education level: Not on file  Occupational History  . Not on file  Tobacco Use  . Smoking status: Never Smoker  . Smokeless tobacco: Never Used  Substance and Sexual Activity  . Alcohol use: Yes    Comment: occasional  . Drug use: No    . Sexual activity: Not on file  Other Topics Concern  . Not on file  Social History Narrative  . Not on file   Social Determinants of Health   Financial Resource Strain:   . Difficulty of Paying Living Expenses:   Food Insecurity:   . Worried About Charity fundraiser in the Last Year:   . Arboriculturist in the Last Year:   Transportation Needs:   . Film/video editor (Medical):   Marland Kitchen Lack of Transportation (Non-Medical):   Physical Activity:   . Days of Exercise per Week:   . Minutes of Exercise per Session:   Stress:   . Feeling of Stress :   Social Connections:   . Frequency of Communication with Friends and Family:   . Frequency of Social Gatherings with Friends and Family:   . Attends Religious Services:   . Active Member of Clubs or Organizations:   . Attends Archivist Meetings:   Marland Kitchen Marital Status:      Family History:  The patient's family history includes Hypertension in her father and mother. There is no history of Breast cancer.  ROS:   Review of Systems  Constitutional: Positive for malaise/fatigue. Negative for chills, diaphoresis, fever and weight loss.  HENT: Negative for congestion.   Eyes: Negative for discharge and redness.  Respiratory: Positive for shortness of breath. Negative for cough, sputum production and wheezing.   Cardiovascular: Negative for chest pain, palpitations, orthopnea, claudication, leg swelling and PND.  Gastrointestinal: Negative for abdominal pain, heartburn, nausea and vomiting.  Musculoskeletal: Negative for falls and myalgias.  Skin: Negative for rash.  Neurological: Positive for weakness. Negative for dizziness, tingling, tremors, sensory change, speech change, focal weakness and loss of consciousness.  Endo/Heme/Allergies: Does not bruise/bleed easily.  Psychiatric/Behavioral: Negative for substance abuse. The patient is nervous/anxious.   All other systems reviewed and are negative.    EKGs/Labs/Other  Studies Reviewed:    Studies reviewed were summarized above. The additional studies were reviewed today: As above  EKG:  EKG is ordered today.  The EKG ordered today demonstrates NSR, 69 bpm, no acute ST-T changes  Recent Labs: No results found for requested labs within last 8760 hours.  Recent Lipid Panel No results found for: CHOL, TRIG, HDL, CHOLHDL, VLDL, LDLCALC, LDLDIRECT  PHYSICAL EXAM:    VS:  BP 136/70 (BP Location: Left Arm, Patient Position: Sitting, Cuff Size: Normal)   Pulse 69   Ht 5\' 3"  (1.6 m)   Wt 145 lb (65.8 kg)   SpO2 98%   BMI 25.69 kg/m   BMI: Body mass index is 25.69 kg/m.  Physical Exam  Constitutional: She is oriented to person, place, and time. She appears well-developed and well-nourished.  HENT:  Head: Normocephalic and atraumatic.  Eyes: Right eye exhibits no discharge. Left eye exhibits no discharge.  Neck: No JVD present.  Cardiovascular: Normal rate, regular rhythm, S1 normal, S2 normal and normal heart sounds. Exam reveals no distant heart sounds, no friction rub, no midsystolic click and no opening snap.  No murmur heard. Pulses:      Posterior tibial pulses are 2+ on the right side and 2+ on the left side.  Pulmonary/Chest: Effort normal and breath sounds normal. No respiratory distress. She has no decreased breath sounds. She has no wheezes. She has no rales. She exhibits no tenderness.  Abdominal: Soft. She exhibits no distension. There is no abdominal tenderness.  Musculoskeletal:        General: No edema.     Cervical back: Normal range of motion.  Neurological: She is alert and oriented to person, place, and time.  Skin: Skin is warm and dry. No cyanosis. Nails show no clubbing.  Psychiatric: She has a normal mood and affect. Her speech is normal and behavior is normal. Judgment and thought content normal.    Wt Readings from Last 3 Encounters:  10/14/19 145 lb (65.8 kg)  07/21/19 156 lb 2 oz (70.8 kg)  04/22/19 154 lb 4 oz (70 kg)       ASSESSMENT & PLAN:   1. Labile HTN: Longstanding issue and has been reasonably controlled.  However, lately with the grief and stress of losing her husband she has noted some elevated BP readings.  Recommend she continue carvedilol 12.5 mg every morning and 25 mg every afternoon along with losartan 100 mg every morning, torsemide 5 mg every morning, and hydralazine 50 mg twice daily.  She will take an additional 50 mg of hydralazine as needed systolic blood pressure greater than 160 mmHg and may repeat x1 if BP persists greater than 160 as long as it has been at least 6 hours after her last dose.  2. Shortness of breath: Longstanding issue.  Initially we discussed obtaining a CBC and echo though ultimately these were deferred with patient preference to manage her blood pressure and grief initially and if symptoms persist she will let us know.  She denies any symptoms concerning for chest pain.  3. Grief/anxiety: This is likely playing a significant role in her overall presentation following the unexpected passing of her husband.  I recommended she follow-up with her PCP for further management of this.  Her note will be routed as an [place].  Disposition: F/u with Dr. Rockey Situ or an APP in 6 months, sooner if needed.   Medication Adjustments/Labs and Tests Ordered: Current medicines are reviewed at length with the patient today.  Concerns regarding medicines are outlined above. Medication changes, Labs and Tests ordered today are summarized above and listed in the Patient Instructions accessible in Encounters.   Signed, Christell Faith, PA-C 10/14/2019 10:58 AM     [place] [address] [place], [place] [address] [phone]

## 2019-10-14 ENCOUNTER — Other Ambulatory Visit: Payer: Self-pay

## 2019-10-14 ENCOUNTER — Encounter: Payer: Self-pay | Admitting: Physician Assistant

## 2019-10-14 ENCOUNTER — Ambulatory Visit: Payer: PPO | Admitting: Physician Assistant

## 2019-10-14 VITALS — BP 136/70 | HR 69 | Ht 63.0 in | Wt 145.0 lb

## 2019-10-14 DIAGNOSIS — F4321 Adjustment disorder with depressed mood: Secondary | ICD-10-CM

## 2019-10-14 DIAGNOSIS — R0989 Other specified symptoms and signs involving the circulatory and respiratory systems: Secondary | ICD-10-CM

## 2019-10-14 DIAGNOSIS — R0602 Shortness of breath: Secondary | ICD-10-CM | POA: Diagnosis not present

## 2019-10-14 DIAGNOSIS — F419 Anxiety disorder, unspecified: Secondary | ICD-10-CM

## 2019-10-14 MED ORDER — HYDRALAZINE HCL 50 MG PO TABS: ORAL_TABLET | ORAL | 3 refills | Status: DC

## 2019-10-14 NOTE — Patient Instructions (Signed)
Medication Instructions:  - Your physician has recommended you make the following change in your medication:   1) Continue hydralazine 50 mg- take 1 tablet by mouth twice daily - you may take an additional hydralazine 50 mg for systolic blood pressure (top number) >160 mid-day as needed - you may repeat this x 1 dose 6 hours later if your blood pressure continues to be elevated  *If you need a refill on your cardiac medications before your next appointment, please call your pharmacy*   Lab Work: - none ordered  If you have labs (blood work) drawn today and your tests are completely normal, you will receive your results only by: Marland Kitchen MyChart Message (if you have MyChart) OR . A paper copy in the mail If you have any lab test that is abnormal or we need to change your treatment, we will call you to review the results.   Testing/Procedures: - none ordered   Follow-Up: At [facility], you and your health needs are our priority.  As part of our continuing mission to provide you with exceptional heart care, we have created designated Provider Care Teams.  These Care Teams include your primary Cardiologist (physician) and Advanced Practice Providers (APPs -  Physician Assistants and Nurse Practitioners) who all work together to provide you with the care you need, when you need it.  We recommend signing up for the patient portal called "MyChart".  Sign up information is provided on this After Visit Summary.  MyChart is used to connect with patients for Virtual Visits (Telemedicine).  Patients are able to view lab/test results, encounter notes, upcoming appointments, etc.  Non-urgent messages can be sent to your provider as well.   To learn more about what you can do with MyChart, go to [email or web address].    Your next appointment:   6 month(s)  The format for your next appointment:   In Person  Provider:    You may see Ida Rogue, MD or one of the following Advanced Practice  Providers on your designated Care Team:    Murray Hodgkins, NP  Christell Faith, PA-C  Marrianne Mood, PA-C  Laurann Montana, NP   Other Instructions N/A

## 2019-10-29 DIAGNOSIS — I10 Essential (primary) hypertension: Secondary | ICD-10-CM | POA: Diagnosis not present

## 2019-10-29 DIAGNOSIS — F411 Generalized anxiety disorder: Secondary | ICD-10-CM | POA: Diagnosis not present

## 2019-11-10 ENCOUNTER — Other Ambulatory Visit: Payer: Self-pay

## 2019-11-10 ENCOUNTER — Encounter: Payer: Self-pay | Admitting: Emergency Medicine

## 2019-11-10 ENCOUNTER — Emergency Department: Payer: PPO

## 2019-11-10 ENCOUNTER — Inpatient Hospital Stay
Admission: EM | Admit: 2019-11-10 | Discharge: 2019-11-13 | DRG: 481 | Disposition: A | Discharge status: Skilled Nursing Facility | Payer: PPO | Attending: Internal Medicine | Admitting: Internal Medicine

## 2019-11-10 DIAGNOSIS — Z01818 Encounter for other preprocedural examination: Secondary | ICD-10-CM | POA: Diagnosis not present

## 2019-11-10 DIAGNOSIS — S72011A Unspecified intracapsular fracture of right femur, initial encounter for closed fracture: Secondary | ICD-10-CM | POA: Diagnosis not present

## 2019-11-10 DIAGNOSIS — S59901A Unspecified injury of right elbow, initial encounter: Secondary | ICD-10-CM | POA: Diagnosis not present

## 2019-11-10 DIAGNOSIS — Z20822 Contact with and (suspected) exposure to covid-19: Secondary | ICD-10-CM | POA: Diagnosis not present

## 2019-11-10 DIAGNOSIS — M48062 Spinal stenosis, lumbar region with neurogenic claudication: Secondary | ICD-10-CM | POA: Diagnosis not present

## 2019-11-10 DIAGNOSIS — E871 Hypo-osmolality and hyponatremia: Secondary | ICD-10-CM | POA: Diagnosis not present

## 2019-11-10 DIAGNOSIS — F411 Generalized anxiety disorder: Secondary | ICD-10-CM | POA: Diagnosis not present

## 2019-11-10 DIAGNOSIS — W19XXXA Unspecified fall, initial encounter: Secondary | ICD-10-CM | POA: Diagnosis not present

## 2019-11-10 DIAGNOSIS — Z888 Allergy status to other drugs, medicaments and biological substances status: Secondary | ICD-10-CM | POA: Diagnosis not present

## 2019-11-10 DIAGNOSIS — S72001A Fracture of unspecified part of neck of right femur, initial encounter for closed fracture: Secondary | ICD-10-CM | POA: Diagnosis not present

## 2019-11-10 DIAGNOSIS — S72001D Fracture of unspecified part of neck of right femur, subsequent encounter for closed fracture with routine healing: Secondary | ICD-10-CM | POA: Diagnosis not present

## 2019-11-10 DIAGNOSIS — Y92009 Unspecified place in unspecified non-institutional (private) residence as the place of occurrence of the external cause: Secondary | ICD-10-CM

## 2019-11-10 DIAGNOSIS — S99911A Unspecified injury of right ankle, initial encounter: Secondary | ICD-10-CM | POA: Diagnosis not present

## 2019-11-10 DIAGNOSIS — Z419 Encounter for procedure for purposes other than remedying health state, unspecified: Secondary | ICD-10-CM

## 2019-11-10 DIAGNOSIS — S72009A Fracture of unspecified part of neck of unspecified femur, initial encounter for closed fracture: Secondary | ICD-10-CM | POA: Diagnosis present

## 2019-11-10 DIAGNOSIS — M25571 Pain in right ankle and joints of right foot: Secondary | ICD-10-CM | POA: Diagnosis not present

## 2019-11-10 DIAGNOSIS — W19XXXD Unspecified fall, subsequent encounter: Secondary | ICD-10-CM | POA: Diagnosis not present

## 2019-11-10 DIAGNOSIS — F339 Major depressive disorder, recurrent, unspecified: Secondary | ICD-10-CM | POA: Diagnosis not present

## 2019-11-10 DIAGNOSIS — M25551 Pain in right hip: Secondary | ICD-10-CM | POA: Insufficient documentation

## 2019-11-10 DIAGNOSIS — W010XXA Fall on same level from slipping, tripping and stumbling without subsequent striking against object, initial encounter: Secondary | ICD-10-CM | POA: Diagnosis present

## 2019-11-10 DIAGNOSIS — S51011A Laceration without foreign body of right elbow, initial encounter: Secondary | ICD-10-CM | POA: Diagnosis not present

## 2019-11-10 DIAGNOSIS — Z79899 Other long term (current) drug therapy: Secondary | ICD-10-CM | POA: Diagnosis not present

## 2019-11-10 DIAGNOSIS — S72009S Fracture of unspecified part of neck of unspecified femur, sequela: Secondary | ICD-10-CM | POA: Diagnosis not present

## 2019-11-10 DIAGNOSIS — Z79890 Hormone replacement therapy (postmenopausal): Secondary | ICD-10-CM

## 2019-11-10 DIAGNOSIS — G4709 Other insomnia: Secondary | ICD-10-CM | POA: Diagnosis not present

## 2019-11-10 DIAGNOSIS — Z8739 Personal history of other diseases of the musculoskeletal system and connective tissue: Secondary | ICD-10-CM | POA: Diagnosis not present

## 2019-11-10 DIAGNOSIS — M25521 Pain in right elbow: Secondary | ICD-10-CM | POA: Diagnosis not present

## 2019-11-10 DIAGNOSIS — Z8249 Family history of ischemic heart disease and other diseases of the circulatory system: Secondary | ICD-10-CM

## 2019-11-10 DIAGNOSIS — K219 Gastro-esophageal reflux disease without esophagitis: Secondary | ICD-10-CM | POA: Diagnosis not present

## 2019-11-10 DIAGNOSIS — I10 Essential (primary) hypertension: Secondary | ICD-10-CM | POA: Diagnosis not present

## 2019-11-10 DIAGNOSIS — R52 Pain, unspecified: Secondary | ICD-10-CM | POA: Diagnosis not present

## 2019-11-10 DIAGNOSIS — Z03818 Encounter for observation for suspected exposure to other biological agents ruled out: Secondary | ICD-10-CM | POA: Diagnosis not present

## 2019-11-10 DIAGNOSIS — Z881 Allergy status to other antibiotic agents status: Secondary | ICD-10-CM | POA: Diagnosis not present

## 2019-11-10 DIAGNOSIS — S7291XA Unspecified fracture of right femur, initial encounter for closed fracture: Secondary | ICD-10-CM | POA: Diagnosis not present

## 2019-11-10 DIAGNOSIS — M25471 Effusion, right ankle: Secondary | ICD-10-CM | POA: Diagnosis present

## 2019-11-10 LAB — CBC WITH DIFFERENTIAL/PLATELET
Abs Immature Granulocytes: 0.14 10*3/uL — ABNORMAL HIGH (ref 0.00–0.07)
Basophils Absolute: 0 10*3/uL (ref 0.0–0.1)
Basophils Relative: 0 %
Eosinophils Absolute: 0 10*3/uL (ref 0.0–0.5)
Eosinophils Relative: 0 %
HCT: 39.5 % (ref 36.0–46.0)
Hemoglobin: 14.1 g/dL (ref 12.0–15.0)
Immature Granulocytes: 1 %
Lymphocytes Relative: 9 %
Lymphs Abs: 1.6 10*3/uL (ref 0.7–4.0)
MCH: 31.1 pg (ref 26.0–34.0)
MCHC: 35.7 g/dL (ref 30.0–36.0)
MCV: 87.2 fL (ref 80.0–100.0)
Monocytes Absolute: 1.3 10*3/uL — ABNORMAL HIGH (ref 0.1–1.0)
Monocytes Relative: 7 %
Neutro Abs: 15.3 10*3/uL — ABNORMAL HIGH (ref 1.7–7.7)
Neutrophils Relative %: 83 %
Platelets: 291 10*3/uL (ref 150–400)
RBC: 4.53 MIL/uL (ref 3.87–5.11)
RDW: 12.7 % (ref 11.5–15.5)
WBC: 18.3 10*3/uL — ABNORMAL HIGH (ref 4.0–10.5)
nRBC: 0 % (ref 0.0–0.2)

## 2019-11-10 LAB — URINALYSIS, COMPLETE (UACMP) WITH MICROSCOPIC
Bilirubin Urine: NEGATIVE
Glucose, UA: NEGATIVE mg/dL
Hgb urine dipstick: NEGATIVE
Ketones, ur: 20 mg/dL — AB
Leukocytes,Ua: NEGATIVE
Nitrite: NEGATIVE
Protein, ur: NEGATIVE mg/dL
Specific Gravity, Urine: 1.005 (ref 1.005–1.030)
pH: 6 (ref 5.0–8.0)

## 2019-11-10 LAB — COMPREHENSIVE METABOLIC PANEL
ALT: 17 U/L (ref 0–44)
AST: 24 U/L (ref 15–41)
Albumin: 4.3 g/dL (ref 3.5–5.0)
Alkaline Phosphatase: 48 U/L (ref 38–126)
Anion gap: 12 (ref 5–15)
BUN: 7 mg/dL — ABNORMAL LOW (ref 8–23)
CO2: 24 mmol/L (ref 22–32)
Calcium: 9.3 mg/dL (ref 8.9–10.3)
Chloride: 88 mmol/L — ABNORMAL LOW (ref 98–111)
Creatinine, Ser: 0.75 mg/dL (ref 0.44–1.00)
GFR calc Af Amer: >60 mL/min (ref >60)
GFR calc non Af Amer: >60 mL/min (ref >60)
Glucose, Bld: 109 mg/dL — ABNORMAL HIGH (ref 70–99)
Potassium: 4 mmol/L (ref 3.5–5.1)
Sodium: 124 mmol/L — ABNORMAL LOW (ref 135–145)
Total Bilirubin: 1.3 mg/dL — ABNORMAL HIGH (ref 0.3–1.2)
Total Protein: 7 g/dL (ref 6.5–8.1)

## 2019-11-10 LAB — TYPE AND SCREEN
ABO/RH(D): A POS
Antibody Screen: NEGATIVE

## 2019-11-10 LAB — SARS CORONAVIRUS 2 BY RT PCR (HOSPITAL ORDER, PERFORMED IN ~~LOC~~ HOSPITAL LAB): SARS Coronavirus 2: NEGATIVE

## 2019-11-10 MED ORDER — HYDRALAZINE HCL 50 MG PO TABS
50.0000 mg | ORAL_TABLET | Freq: Two times a day (BID) | ORAL | Status: DC
  Administered 2019-11-10 – 2019-11-11 (×2): 50 mg via ORAL
  Filled 2019-11-10 (×3): qty 1

## 2019-11-10 MED ORDER — PANTOPRAZOLE SODIUM 40 MG PO TBEC
40.0000 mg | DELAYED_RELEASE_TABLET | Freq: Every day | ORAL | Status: DC
  Administered 2019-11-10 – 2019-11-13 (×3): 40 mg via ORAL
  Filled 2019-11-10 (×3): qty 1

## 2019-11-10 MED ORDER — CARVEDILOL 25 MG PO TABS
25.0000 mg | ORAL_TABLET | Freq: Every day | ORAL | Status: DC
  Administered 2019-11-10: 25 mg via ORAL
  Filled 2019-11-10: qty 1

## 2019-11-10 MED ORDER — ESTRADIOL 1 MG PO TABS
1.0000 mg | ORAL_TABLET | Freq: Every day | ORAL | Status: DC
  Administered 2019-11-12 – 2019-11-13 (×2): 1 mg via ORAL
  Filled 2019-11-10 (×3): qty 1

## 2019-11-10 MED ORDER — BISACODYL 10 MG RE SUPP
10.0000 mg | Freq: Every day | RECTAL | Status: DC | PRN
  Filled 2019-11-10: qty 1

## 2019-11-10 MED ORDER — CEFAZOLIN SODIUM-DEXTROSE 1-4 GM/50ML-% IV SOLN
1.0000 g | Freq: Once | INTRAVENOUS | Status: AC
  Administered 2019-11-11: 2 g via INTRAVENOUS
  Filled 2019-11-10: qty 50

## 2019-11-10 MED ORDER — ACETAMINOPHEN 325 MG PO TABS
650.0000 mg | ORAL_TABLET | Freq: Four times a day (QID) | ORAL | Status: DC | PRN
  Administered 2019-11-11: 650 mg via ORAL
  Filled 2019-11-10: qty 2

## 2019-11-10 MED ORDER — MORPHINE SULFATE (PF) 4 MG/ML IV SOLN: 4.0000 mg | Freq: Once | INTRAVENOUS | Status: AC

## 2019-11-10 MED ORDER — MORPHINE SULFATE (PF) 4 MG/ML IV SOLN
INTRAVENOUS | Status: AC
  Administered 2019-11-10: 4 mg
  Filled 2019-11-10: qty 1

## 2019-11-10 MED ORDER — SENNA 8.6 MG PO TABS
1.0000 | ORAL_TABLET | Freq: Two times a day (BID) | ORAL | Status: DC
  Administered 2019-11-10 – 2019-11-13 (×5): 8.6 mg via ORAL
  Filled 2019-11-10 (×5): qty 1

## 2019-11-10 MED ORDER — ONDANSETRON HCL 4 MG/2ML IJ SOLN
INTRAMUSCULAR | Status: AC
  Administered 2019-11-10: 4 mg
  Filled 2019-11-10: qty 2

## 2019-11-10 MED ORDER — CARVEDILOL 12.5 MG PO TABS
12.5000 mg | ORAL_TABLET | Freq: Every morning | ORAL | Status: DC
  Administered 2019-11-11 – 2019-11-12 (×2): 12.5 mg via ORAL
  Filled 2019-11-10 (×2): qty 1

## 2019-11-10 MED ORDER — TORSEMIDE 10 MG PO TABS
5.0000 mg | ORAL_TABLET | Freq: Every day | ORAL | Status: DC
  Filled 2019-11-10 (×2): qty 0.5

## 2019-11-10 MED ORDER — ONDANSETRON HCL 4 MG/2ML IJ SOLN: 4.0000 mg | Freq: Once | INTRAMUSCULAR | Status: AC

## 2019-11-10 MED ORDER — SODIUM CHLORIDE 0.9 % IV SOLN: INTRAVENOUS | Status: DC

## 2019-11-10 MED ORDER — TRAMADOL HCL 50 MG PO TABS
50.0000 mg | ORAL_TABLET | Freq: Four times a day (QID) | ORAL | Status: DC | PRN
  Administered 2019-11-10 – 2019-11-12 (×4): 50 mg via ORAL
  Filled 2019-11-10 (×5): qty 1

## 2019-11-10 MED ORDER — TRAMADOL HCL 50 MG PO TABS
50.0000 mg | ORAL_TABLET | Freq: Once | ORAL | Status: AC
  Administered 2019-11-10: 50 mg via ORAL
  Filled 2019-11-10: qty 1

## 2019-11-10 MED ORDER — CARVEDILOL 25 MG PO TABS: 25.0000 mg | ORAL_TABLET | Freq: Two times a day (BID) | ORAL | Status: DC

## 2019-11-10 MED ORDER — LOSARTAN POTASSIUM 50 MG PO TABS
100.0000 mg | ORAL_TABLET | Freq: Every day | ORAL | Status: DC
  Administered 2019-11-12: 100 mg via ORAL
  Filled 2019-11-10: qty 2

## 2019-11-10 MED ORDER — NITROGLYCERIN 0.4 MG SL SUBL: 0.4000 mg | SUBLINGUAL_TABLET | SUBLINGUAL | Status: DC | PRN

## 2019-11-10 NOTE — ED Provider Notes (Signed)
[facility] Emergency Department Provider Note ____________________________________________  Time seen: Approximately 3:20 PM  I have reviewed the triage vital signs and the nursing notes.   HISTORY  Chief Complaint Hip Pain    HPI [name] Woods is a 79 y.o. female who presents to the emergency department for evaluation and treatment of right hip pain post mechanical non-syncopal fall about 10 am this morning. She did not lose consciousness or hit her head. She is not on blood thinners.    Past Medical History:  Diagnosis Date  . Chest pain    a. 10/2014 St Echo: nl LV fxn, no wma/ischemia.  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 Guillain Barr syndrome ([place]) 1980  . Hypertension   . Spinal stenosis     Patient Active Problem List   Diagnosis Date Noted  . Labile hypertension 09/16/2018  . Atypical chest pain 09/16/2018  . Chills 07/11/2017  . Essential hypertension 08/03/2016  . SOB (shortness of breath) 08/03/2016  . Anxiety 08/03/2016  . Leg edema 08/03/2016    Past Surgical History:  Procedure Laterality Date  . ABDOMINAL HYSTERECTOMY    . AUGMENTATION MAMMAPLASTY  2006  . BACK SURGERY    . CERVICAL SPINE SURGERY N/A 2016   for myelopathy  . LUMBAR DISC SURGERY      Prior to Admission medications   Medication Sig Start Date End Date Taking? Authorizing Provider  carvedilol (COREG) 25 MG tablet Take 1 tablet (25 mg total) by mouth 2 (two) times daily with a meal. Take 0.5 tablet (12.5 mg total) in the morning and 1 tablet (25 mg total) in the afternoon. 07/21/19   Minna Merritts, MD  estradiol (ESTRACE) 1 MG tablet Take 1 mg by mouth daily.    [provider]  hydrALAZINE (APRESOLINE) 50 MG tablet Take 1 tablet (50 mg) by mouth twice daily. You may take 1 extra tablet (50 mg) mid-day as needed for a blood pressure >160. May repeat an additional 1 tablet 6 hours later if needed for elevated blood pressure. 10/14/19   Dunn,  Areta Haber, PA-C  losartan (COZAAR) 100 MG tablet Take 1 tablet (100 mg total) by mouth daily. 07/21/19   Minna Merritts, MD  nitroGLYCERIN (NITROSTAT) 0.4 MG SL tablet Place 1 tablet (0.4 mg total) under the tongue every 5 (five) minutes as needed for chest pain. 07/21/19   Minna Merritts, MD  omeprazole (PRILOSEC) 40 MG capsule Take 40 mg by mouth daily. 08/24/15   [provider]  torsemide (DEMADEX) 5 MG tablet Take 1 tablet (5 mg total) by mouth daily. 07/17/18   Rise Mu, PA-C    Allergies Azithromycin and Amlodipine  Family History  Problem Relation Age of Onset  . Hypertension Mother   . Hypertension Father   . Breast cancer Neg Hx     Social History Social History   Tobacco Use  . Smoking status: Never Smoker  . Smokeless tobacco: Never Used  Vaping Use  . Vaping Use: Never used  Substance Use Topics  . Alcohol use: Yes    Comment:  3/week  . Drug use: No    Review of Systems Constitutional: Negative for fever. Cardiovascular: Negative for chest pain. Respiratory: Negative for shortness of breath. Musculoskeletal: Positive for right hip pain, right ankle pain, and right elbow pain. Skin: Positive for skin tear on right elbow.  Neurological: Negative for decrease in sensation  ____________________________________________   PHYSICAL EXAM:  VITAL SIGNS:  ED Triage Vitals  Enc Vitals Group     BP 11/10/19 1411 (!) 151/72     Pulse Rate 11/10/19 1411 71     Resp 11/10/19 1411 18     Temp 11/10/19 1411 99 F (37.2 C)     Temp Source 11/10/19 1411 Oral     SpO2 11/10/19 1411 95 %     Weight 11/10/19 1412 140 lb (63.5 kg)     Height 11/10/19 1412 5\' 3"  (1.6 m)     Head Circumference --      Peak Flow --      Pain Score 11/10/19 1411 5     Pain Loc --      Pain Edu? --      Excl. in [place]? --     Constitutional: Alert and oriented. Well appearing and in no acute distress. Eyes: Conjunctivae are clear without discharge or drainage Head:  Atraumatic Neck: Supple. No focal midline tenderness.  Respiratory: No cough. Respirations are even and unlabored. Musculoskeletal: Focal right hip tenderness over the lateral aspect of the proximal femur that radiates toward the knee. FROM of the right ankle with diffuse swelling. FROM of the right elbow. Neurologic: Awake, alert, and oriented  Skin: Skin tear overlying the right elbow. Bleeding controlled with bandage in place.  Psychiatric: Affect and behavior are appropriate.  ____________________________________________   LABS (all labs ordered are listed, but only abnormal results are displayed)  Labs Reviewed  COMPREHENSIVE METABOLIC PANEL  CBC WITH DIFFERENTIAL/PLATELET  URINALYSIS, COMPLETE (UACMP) WITH MICROSCOPIC  TYPE AND SCREEN   ____________________________________________  RADIOLOGY  Subcapital femoral neck fracture on the right side  I, Dakarai Mcglocklin, personally viewed and evaluated these images (plain radiographs) as part of my medical decision making, as well as reviewing the written report by the radiologist.  DG Elbow Complete Right  Result Date: 11/10/2019 CLINICAL DATA:  Pain post fall EXAM: RIGHT ELBOW - COMPLETE 3+ VIEW COMPARISON:  None. FINDINGS: There is no evidence of fracture, dislocation, or joint effusion. There is no evidence of arthropathy or other focal bone abnormality. Soft tissues are unremarkable. IMPRESSION: Negative. Electronically Signed   By: Donavan Foil M.D.   On: 11/10/2019 16:07   DG Ankle Complete Right  Result Date: 11/10/2019 CLINICAL DATA:  Pain post fall EXAM: RIGHT ANKLE - COMPLETE 3+ VIEW COMPARISON:  None. FINDINGS: No fracture or malalignment. Small plantar calcaneal spur. Ankle mortise appears symmetric. IMPRESSION: No acute osseous abnormality. Electronically Signed   By: Donavan Foil M.D.   On: 11/10/2019 16:05   DG HIP UNILAT WITH PELVIS 2-3 VIEWS RIGHT  Result Date: 11/10/2019 CLINICAL DATA:  Fall EXAM: DG HIP (WITH OR  WITHOUT PELVIS) 2-3V RIGHT COMPARISON:  None. FINDINGS: Decreased osseous mineralization. There is a fracture through the right femoral neck with impaction and mild displacement. Joint spaces are preserved. No dislocation. IMPRESSION: Acute impacted and mildly displaced right subcapital femoral neck fracture. Electronically Signed   By: Macy Mis M.D.   On: 11/10/2019 16:05   ____________________________________________   PROCEDURES  Procedures  ____________________________________________   INITIAL IMPRESSION / ASSESSMENT AND PLAN / ED COURSE  [name] Woods is a 79 y.o. who presents to the emergency department for evaluation after mechanical, nonsyncopal fall at home this morning.  Patient states that she turned around too fast and fell.  She landed on her right hip.  She did not strike her head or lose consciousness.  She is not currently on any type of blood thinner  either.  Pain with attempt to ambulate.  X-ray of the right hip does show a impacted mildly displaced right subcapital femoral neck fracture.  Ankle images are negative for fracture as are the images of the right elbow.  Will contact orthopedics as well as hospitalist.  Preop labs and chest x-ray ordered.  Dr. Rudene Christians consulted and is currently in with the patient.  Dr. Posey Pronto has accepted the patient for admission.   Medications  ceFAZolin (ANCEF) IVPB 1 g/50 mL premix (has no administration in time range)  traMADol (ULTRAM) tablet 50 mg (50 mg Oral Given 11/10/19 1634)    Pertinent labs & imaging results that were available during my care of the patient were reviewed by me and considered in my medical decision making (see chart for details).   _________________________________________   FINAL CLINICAL IMPRESSION(S) / ED DIAGNOSES  Final diagnoses:  Acute hip pain, right  Closed fracture of neck of right femur, initial encounter [facility])    ED Discharge Orders    None       If controlled substance  prescribed during this visit, 12 month history viewed on the [place] prior to issuing an initial prescription for Schedule II or III opiod.   Victorino Dike, FNP 11/10/19 1644    Delman Kitten, MD 11/10/19 2342

## 2019-11-10 NOTE — ED Triage Notes (Signed)
Pt comes into the ED via EMS from home, states she slipped and fell around 10am and has been having left hip pain since, VSS. 160/90.the patient  Is in NAD.

## 2019-11-10 NOTE — H&P (Addendum)
[facility] [place] NAME: [name] Woods    MR#:  [id number]  DATE OF BIRTH:  1941/03/25  DATE OF ADMISSION:  11/10/2019  PRIMARY CARE PHYSICIAN: Kirk Ruths, MD   REQUESTING/REFERRING PHYSICIAN: Cari triplett,PA  Patient coming from : [facility] independent living   CHIEF COMPLAINT:  mechanical fall after getting out from the car-- right hip pain  HISTORY OF PRESENT ILLNESS:  [name] Woods  is a 79 y.o. female with a known history of hypertension, spinal stenosis status post back surgery, history of Guillain-Barr syndrome in 1978, comes to the emergency room after she had a mechanical fall when she turned around to quick lost balance fell. Patient went in her apartment. She continues to have right hip pain. Brought to the emergency room.  ED course: patient has been hemodynamically stable. Pulse 71 blood pressure 151/72 sats are 95% on room air. Workup in the ER showed Acute impacted and mildly displaced right subcapital femoral neck fracture.  Patient denies any cardiac history. She was evaluated for chest pain few years ago stress echo was normal. Her EKG today is normal sinus rhythm. Denies any chest pain. She follows with [facility] cardiology for her blood pressure. Recently was evaluated on June 2  patient has been having some issues with anxiety and grieving for recent unexpected death of her husband.  She denies shortness of breath fever nausea vomiting.  Patient is being admitted for acute right femoral neck fracture. She was seen by Dr. Rudene Christians in the emergency room.  Patient's routine labs with CBC comprehensive metabolic panel have still not been drawn yet!    PAST MEDICAL HISTORY:       Past Medical History:  Diagnosis Date  . Chest pain    a. 10/2014 St Echo: nl LV fxn, no wma/ischemia.  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 Guillain Barr syndrome ([place]) 1980  . Hypertension   . Spinal  stenosis     PAST SURGICAL HISTOIRY:        Past Surgical History:  Procedure Laterality Date  . ABDOMINAL HYSTERECTOMY    . AUGMENTATION MAMMAPLASTY  2006  . BACK SURGERY    . CERVICAL SPINE SURGERY N/A 2016   for myelopathy  . LUMBAR DISC SURGERY      SOCIAL HISTORY:   Social History        Tobacco Use  . Smoking status: Never Smoker  . Smokeless tobacco: Never Used  Substance Use Topics  . Alcohol use: Yes    Comment:  3/week    FAMILY HISTORY:        Family History  Problem Relation Age of Onset  . Hypertension Mother   . Hypertension Father   . Breast cancer Neg Hx     DRUG ALLERGIES:       Allergies  Allergen Reactions  . Azithromycin Hives and Rash  . Amlodipine Swelling    REVIEW OF SYSTEMS:  Review of Systems  Constitutional: Negative for chills, fever and weight loss.  HENT: Negative for ear discharge, ear pain and nosebleeds.   Eyes: Negative for blurred vision, pain and discharge.  Respiratory: Negative for sputum production, shortness of breath, wheezing and stridor.   Cardiovascular: Negative for chest pain, palpitations, orthopnea and PND.  Gastrointestinal: Negative for abdominal pain, diarrhea, nausea and vomiting.  Genitourinary: Negative for frequency and urgency.  Musculoskeletal: Positive for falls and joint pain. Negative for back pain.  Neurological: Positive for weakness.  Negative for sensory change, speech change and focal weakness.  Psychiatric/Behavioral: Negative for depression and hallucinations. The patient is not nervous/anxious.      MEDICATIONS AT HOME:          Prior to Admission medications   Medication Sig Start Date End Date Taking? Authorizing Provider  carvedilol (COREG) 25 MG tablet Take 1 tablet (25 mg total) by mouth 2 (two) times daily with a meal. Take 0.5 tablet (12.5 mg total) in the morning and 1 tablet (25 mg total) in the afternoon. 07/21/19   Minna Merritts, MD   estradiol (ESTRACE) 1 MG tablet Take 1 mg by mouth daily.    [provider]  hydrALAZINE (APRESOLINE) 50 MG tablet Take 1 tablet (50 mg) by mouth twice daily. You may take 1 extra tablet (50 mg) mid-day as needed for a blood pressure >160. May repeat an additional 1 tablet 6 hours later if needed for elevated blood pressure. 10/14/19   Dunn, Areta Haber, PA-C  losartan (COZAAR) 100 MG tablet Take 1 tablet (100 mg total) by mouth daily. 07/21/19   Minna Merritts, MD  nitroGLYCERIN (NITROSTAT) 0.4 MG SL tablet Place 1 tablet (0.4 mg total) under the tongue every 5 (five) minutes as needed for chest pain. 07/21/19   Minna Merritts, MD  omeprazole (PRILOSEC) 40 MG capsule Take 40 mg by mouth daily. 08/24/15   [provider]  torsemide (DEMADEX) 5 MG tablet Take 1 tablet (5 mg total) by mouth daily. 07/17/18   Dunn, Areta Haber, PA-C      VITAL SIGNS:  Blood pressure (!) 167/76, pulse 72, temperature 99 F (37.2 C), temperature source Oral, resp. rate 17, height 5\' 3"  (1.6 m), weight 63.5 kg, SpO2 95 %.  PHYSICAL EXAMINATION:  GENERAL:  79 y.o.-year-old patient lying in the bed with no acute distress.  EYES: Pupils equal, round, reactive to light and accommodation. No scleral icterus.  HEENT: Head atraumatic, normocephalic. Oropharynx and nasopharynx clear.  NECK:  Supple, no jugular venous distention. No thyroid enlargement, no tenderness.  LUNGS: Normal breath sounds bilaterally, no wheezing, rales,rhonchi or crepitation. No use of accessory muscles of respiration.  CARDIOVASCULAR: S1, S2 normal. No murmurs, rubs, or gallops.  ABDOMEN: Soft, nontender, nondistended. Bowel sounds present. No organomegaly or mass.  EXTREMITIES: No pedal edema, cyanosis, or clubbing. Right lower extremity decreased range of motion secondary to pain NEUROLOGIC: Cranial nerves II through XII are intact  grossly non focal  PSYCHIATRIC:  patient is alert and oriented x 3.  SKIN: No obvious  rash, lesion, or ulcer.   LABORATORY PANEL:   CBC Last Labs   No results for input(s): WBC, HGB, HCT, PLT in the last 168 hours.   ------------------------------------------------------------------------------------------------------------------  Chemistries   Last Labs   No results for input(s): NA, K, CL, CO2, GLUCOSE, BUN, CREATININE, CALCIUM, MG, AST, ALT, ALKPHOS, BILITOT in the last 168 hours.  Invalid input(s): GFRCGP   ------------------------------------------------------------------------------------------------------------------  Cardiac Enzymes Last Labs   No results for input(s): TROPONINI in the last 168 hours.   ------------------------------------------------------------------------------------------------------------------  RADIOLOGY:   Imaging Results (Last 48 hours)  DG Chest 1 View  Result Date: 11/10/2019 CLINICAL DATA:  Preoperative evaluation. EXAM: CHEST  1 VIEW COMPARISON:  None. FINDINGS: Mild diffuse chronic appearing increased lung markings are seen. There is no evidence of acute infiltrate, pleural effusion or pneumothorax. The heart size and mediastinal contours are within normal limits. Degenerative changes seen throughout the thoracic spine. IMPRESSION: No active disease. Electronically  Signed   By: Virgina Norfolk M.D.   On: 11/10/2019 16:43   DG Elbow Complete Right  Result Date: 11/10/2019 CLINICAL DATA:  Pain post fall EXAM: RIGHT ELBOW - COMPLETE 3+ VIEW COMPARISON:  None. FINDINGS: There is no evidence of fracture, dislocation, or joint effusion. There is no evidence of arthropathy or other focal bone abnormality. Soft tissues are unremarkable. IMPRESSION: Negative. Electronically Signed   By: Donavan Foil M.D.   On: 11/10/2019 16:07   DG Ankle Complete Right  Result Date: 11/10/2019 CLINICAL DATA:  Pain post fall EXAM: RIGHT ANKLE - COMPLETE 3+ VIEW COMPARISON:  None. FINDINGS: No fracture or malalignment. Small plantar calcaneal  spur. Ankle mortise appears symmetric. IMPRESSION: No acute osseous abnormality. Electronically Signed   By: Donavan Foil M.D.   On: 11/10/2019 16:05   DG HIP UNILAT WITH PELVIS 2-3 VIEWS RIGHT  Result Date: 11/10/2019 CLINICAL DATA:  Fall EXAM: DG HIP (WITH OR WITHOUT PELVIS) 2-3V RIGHT COMPARISON:  None. FINDINGS: Decreased osseous mineralization. There is a fracture through the right femoral neck with impaction and mild displacement. Joint spaces are preserved. No dislocation. IMPRESSION: Acute impacted and mildly displaced right subcapital femoral neck fracture. Electronically Signed   By: Macy Mis M.D.   On: 11/10/2019 16:05     EKG:  normal sinus rhythm. No acute STT changes  IMPRESSION AND PLAN:   Cathern Tahir  is a 79 y.o. female with a known history of hypertension, spinal stenosis status post back surgery, history of Guillain-Barr syndrome in 1978, comes to the emergency room after she had a mechanical fall when she turned around to quick lost balance fell. Patient went in her apartment. She continues to have right hip pain. Brought to the emergency room.  1. Preoperative evaluation for right hip fracture status post mechanical fall -admit to orthopedic floor -IV fluids -NPO after midnight -patient reports she does not tolerate narcotics well. Will do Tylenol and Ultram for pain -as needed Toradol -orthopedic consultation Dr Rudene Christians-- plans to operate tomorrow -patient has no cardiac history. EKG within normal limit. -She is at a low to intermediate risk for surgery -patient follows with [facility]ology for high blood pressure -please note patient's labs have not been drawn yet as I'm dictating--- [facility] hospitalist to review labs -most recent BMP done as outpt in May 2021 looks ok  2. Hypertension -resume Coreg, hydralazine, losartan, torsemide  3. Generalized anxiety disorder -patient was started on some anxiety meds per Dr. Ouida Sills-- awaiting medication  reconciliation to be done and resume it  4. History of spinal stenosis with surgery in the past  5. DVT prophylaxis on hold pending surgery continue SCD     Family Communication : none Consults : orthopedic Code Status : full discussed with patient DVT prophylaxis : SCD  TOTAL TIME TAKING CARE OF THIS PATIENT: *50* minutes.    Fritzi Mandes M.D  [facility]     CC: Primary care physician; Kirk Ruths, MD           Note Details  Mammie Lorenzo, MD File Time 11/10/2019 5:37 PM  Author Type Physician Status Incomplete  Last Editor Fritzi Mandes, [place] # [id number] Admit Date 11/10/2019

## 2019-11-10 NOTE — Progress Notes (Signed)
Report called to 1A  

## 2019-11-10 NOTE — ED Notes (Signed)
See triage note  Presents s/p fall this am  States she lost her balance   [address] on right hip    Having pain to right hip and right ankle

## 2019-11-10 NOTE — ED Triage Notes (Signed)
Pt via ems from home ([facility]) after falling this morning. She reports that she is now having trouble lifting her right leg. Pt also c/o right foot and arm pain (skin tear). Pt denies hitting head or loc. Pt alert & oriented, nad noted.

## 2019-11-11 ENCOUNTER — Encounter
Admission: EM | Disposition: A | Discharge status: Skilled Nursing Facility | Payer: Self-pay | Source: Home / Self Care | Attending: Internal Medicine

## 2019-11-11 ENCOUNTER — Inpatient Hospital Stay: Payer: PPO | Admitting: Certified Registered Nurse Anesthetist

## 2019-11-11 ENCOUNTER — Inpatient Hospital Stay: Payer: PPO

## 2019-11-11 ENCOUNTER — Encounter: Payer: Self-pay | Admitting: Internal Medicine

## 2019-11-11 DIAGNOSIS — E871 Hypo-osmolality and hyponatremia: Secondary | ICD-10-CM

## 2019-11-11 DIAGNOSIS — Z8739 Personal history of other diseases of the musculoskeletal system and connective tissue: Secondary | ICD-10-CM

## 2019-11-11 DIAGNOSIS — S72009S Fracture of unspecified part of neck of unspecified femur, sequela: Secondary | ICD-10-CM

## 2019-11-11 HISTORY — PX: HIP PINNING,CANNULATED: SHX1758

## 2019-11-11 LAB — CBC
HCT: 33.1 % — ABNORMAL LOW (ref 36.0–46.0)
Hemoglobin: 12 g/dL (ref 12.0–15.0)
MCH: 31.1 pg (ref 26.0–34.0)
MCHC: 36.3 g/dL — ABNORMAL HIGH (ref 30.0–36.0)
MCV: 85.8 fL (ref 80.0–100.0)
Platelets: 221 10*3/uL (ref 150–400)
RBC: 3.86 MIL/uL — ABNORMAL LOW (ref 3.87–5.11)
RDW: 13 % (ref 11.5–15.5)
WBC: 9.9 10*3/uL (ref 4.0–10.5)
nRBC: 0 % (ref 0.0–0.2)

## 2019-11-11 LAB — SURGICAL PCR SCREEN
MRSA, PCR: NEGATIVE
Staphylococcus aureus: NEGATIVE

## 2019-11-11 LAB — CREATININE, SERUM
Creatinine, Ser: 0.74 mg/dL (ref 0.44–1.00)
GFR calc Af Amer: >60 mL/min (ref >60)
GFR calc non Af Amer: >60 mL/min (ref >60)

## 2019-11-11 LAB — SODIUM: Sodium: 126 mmol/L — ABNORMAL LOW (ref 135–145)

## 2019-11-11 SURGERY — FIXATION, FEMUR, NECK, PERCUTANEOUS, USING SCREW
Anesthesia: General | Site: Hip | Laterality: Right

## 2019-11-11 MED ORDER — GLYCOPYRROLATE 0.2 MG/ML IJ SOLN
INTRAMUSCULAR | Status: DC | PRN
  Administered 2019-11-11: .2 mg via INTRAVENOUS

## 2019-11-11 MED ORDER — SODIUM CHLORIDE 0.9 % IV SOLN: INTRAVENOUS | Status: DC

## 2019-11-11 MED ORDER — HYDROMORPHONE HCL 1 MG/ML IJ SOLN
INTRAMUSCULAR | Status: DC | PRN
  Administered 2019-11-11: .5 mg via INTRAVENOUS

## 2019-11-11 MED ORDER — ONDANSETRON HCL 4 MG/2ML IJ SOLN
INTRAMUSCULAR | Status: DC | PRN
  Administered 2019-11-11: 4 mg via INTRAVENOUS

## 2019-11-11 MED ORDER — ROCURONIUM BROMIDE 100 MG/10ML IV SOLN
INTRAVENOUS | Status: DC | PRN
  Administered 2019-11-11: 40 mg via INTRAVENOUS

## 2019-11-11 MED ORDER — MAGNESIUM HYDROXIDE 400 MG/5ML PO SUSP
30.0000 mL | Freq: Every day | ORAL | Status: DC | PRN
  Administered 2019-11-13: 30 mL via ORAL
  Filled 2019-11-11: qty 30

## 2019-11-11 MED ORDER — NEOMYCIN-POLYMYXIN B GU 40-200000 IR SOLN
Status: AC
  Filled 2019-11-11: qty 1

## 2019-11-11 MED ORDER — PHENOL 1.4 % MT LIQD
1.0000 | OROMUCOSAL | Status: DC | PRN
  Administered 2019-11-12: 1 via OROMUCOSAL
  Filled 2019-11-11 (×2): qty 177

## 2019-11-11 MED ORDER — ENSURE ENLIVE PO LIQD
237.0000 mL | Freq: Two times a day (BID) | ORAL | Status: DC
  Administered 2019-11-12 – 2019-11-13 (×2): 237 mL via ORAL

## 2019-11-11 MED ORDER — LIDOCAINE HCL (CARDIAC) PF 100 MG/5ML IV SOSY
PREFILLED_SYRINGE | INTRAVENOUS | Status: DC | PRN
  Administered 2019-11-11: 60 mg via INTRAVENOUS

## 2019-11-11 MED ORDER — CEFAZOLIN SODIUM-DEXTROSE 1-4 GM/50ML-% IV SOLN
1.0000 g | Freq: Four times a day (QID) | INTRAVENOUS | Status: AC
  Administered 2019-11-11 – 2019-11-12 (×2): 1 g via INTRAVENOUS
  Filled 2019-11-11 (×4): qty 50

## 2019-11-11 MED ORDER — SODIUM CHLORIDE 0.9 % IV SOLN
INTRAVENOUS | Status: DC | PRN
Start: 2019-11-11 — End: 2019-11-11

## 2019-11-11 MED ORDER — PROPOFOL 500 MG/50ML IV EMUL
INTRAVENOUS | Status: AC
  Filled 2019-11-11: qty 50

## 2019-11-11 MED ORDER — NEOMYCIN-POLYMYXIN B GU 40-200000 IR SOLN
Status: DC | PRN
  Administered 2019-11-11: 2 mL

## 2019-11-11 MED ORDER — PHENYLEPHRINE HCL (PRESSORS) 10 MG/ML IV SOLN
INTRAVENOUS | Status: DC | PRN
  Administered 2019-11-11 (×3): 100 ug via INTRAVENOUS

## 2019-11-11 MED ORDER — ONDANSETRON HCL 4 MG/2ML IJ SOLN: 4.0000 mg | Freq: Four times a day (QID) | INTRAMUSCULAR | Status: DC | PRN

## 2019-11-11 MED ORDER — ALUM & MAG HYDROXIDE-SIMETH 200-200-20 MG/5ML PO SUSP: 30.0000 mL | ORAL | Status: DC | PRN

## 2019-11-11 MED ORDER — SODIUM CHLORIDE 0.9 % IV SOLN
INTRAVENOUS | Status: DC | PRN
  Administered 2019-11-11: 30 ug/min via INTRAVENOUS

## 2019-11-11 MED ORDER — ONDANSETRON HCL 4 MG/2ML IJ SOLN: INTRAMUSCULAR | Status: DC | PRN

## 2019-11-11 MED ORDER — ONDANSETRON HCL 4 MG PO TABS: 4.0000 mg | ORAL_TABLET | Freq: Four times a day (QID) | ORAL | Status: DC | PRN

## 2019-11-11 MED ORDER — MENTHOL 3 MG MT LOZG
1.0000 | LOZENGE | OROMUCOSAL | Status: DC | PRN
  Filled 2019-11-11 (×2): qty 9

## 2019-11-11 MED ORDER — FENTANYL CITRATE (PF) 100 MCG/2ML IJ SOLN
INTRAMUSCULAR | Status: AC
  Filled 2019-11-11: qty 2

## 2019-11-11 MED ORDER — ENOXAPARIN SODIUM 40 MG/0.4ML ~~LOC~~ SOLN
40.0000 mg | SUBCUTANEOUS | Status: DC
  Administered 2019-11-12 – 2019-11-13 (×2): 40 mg via SUBCUTANEOUS
  Filled 2019-11-11 (×2): qty 0.4

## 2019-11-11 MED ORDER — OXYCODONE HCL 5 MG PO TABS: 5.0000 mg | ORAL_TABLET | Freq: Once | ORAL | Status: DC | PRN

## 2019-11-11 MED ORDER — DOCUSATE SODIUM 100 MG PO CAPS
100.0000 mg | ORAL_CAPSULE | Freq: Two times a day (BID) | ORAL | Status: DC
  Administered 2019-11-11 – 2019-11-13 (×4): 100 mg via ORAL
  Filled 2019-11-11 (×4): qty 1

## 2019-11-11 MED ORDER — BISACODYL 10 MG RE SUPP: 10.0000 mg | Freq: Every day | RECTAL | Status: DC | PRN

## 2019-11-11 MED ORDER — ONDANSETRON HCL 4 MG/2ML IJ SOLN: 4.0000 mg | Freq: Once | INTRAMUSCULAR | Status: DC | PRN

## 2019-11-11 MED ORDER — FENTANYL CITRATE (PF) 100 MCG/2ML IJ SOLN
INTRAMUSCULAR | Status: DC | PRN
  Administered 2019-11-11: 50 ug via INTRAVENOUS

## 2019-11-11 MED ORDER — FENTANYL CITRATE (PF) 100 MCG/2ML IJ SOLN
25.0000 ug | INTRAMUSCULAR | Status: DC | PRN
  Administered 2019-11-11 (×3): 25 ug via INTRAVENOUS

## 2019-11-11 MED ORDER — DEXAMETHASONE SODIUM PHOSPHATE 10 MG/ML IJ SOLN
INTRAMUSCULAR | Status: DC | PRN
  Administered 2019-11-11: 10 mg via INTRAVENOUS

## 2019-11-11 MED ORDER — METOCLOPRAMIDE HCL 10 MG PO TABS: 5.0000 mg | ORAL_TABLET | Freq: Three times a day (TID) | ORAL | Status: DC | PRN

## 2019-11-11 MED ORDER — HYDROMORPHONE HCL 1 MG/ML IJ SOLN
INTRAMUSCULAR | Status: AC
  Filled 2019-11-11: qty 1

## 2019-11-11 MED ORDER — ESCITALOPRAM OXALATE 10 MG PO TABS
10.0000 mg | ORAL_TABLET | Freq: Every day | ORAL | Status: DC
  Administered 2019-11-12 – 2019-11-13 (×2): 10 mg via ORAL
  Filled 2019-11-11 (×2): qty 1

## 2019-11-11 MED ORDER — METOCLOPRAMIDE HCL 5 MG/ML IJ SOLN: 5.0000 mg | Freq: Three times a day (TID) | INTRAMUSCULAR | Status: DC | PRN

## 2019-11-11 MED ORDER — MAGNESIUM CITRATE PO SOLN
1.0000 | Freq: Once | ORAL | Status: DC | PRN
  Filled 2019-11-11: qty 296

## 2019-11-11 MED ORDER — OXYCODONE HCL 5 MG/5ML PO SOLN: 5.0000 mg | Freq: Once | ORAL | Status: DC | PRN

## 2019-11-11 MED ORDER — SUGAMMADEX SODIUM 200 MG/2ML IV SOLN
INTRAVENOUS | Status: DC | PRN
  Administered 2019-11-11: 240 mg via INTRAVENOUS

## 2019-11-11 MED ORDER — PROPOFOL 10 MG/ML IV BOLUS
INTRAVENOUS | Status: DC | PRN
  Administered 2019-11-11: 100 mg via INTRAVENOUS

## 2019-11-11 MED ORDER — CEFAZOLIN SODIUM-DEXTROSE 1-4 GM/50ML-% IV SOLN
INTRAVENOUS | Status: AC
  Filled 2019-11-11: qty 50

## 2019-11-11 SURGICAL SUPPLY — 33 items
BIT DRILL CANN LRG QC 5X300 (BIT) ×3 IMPLANT
CANISTER SUCT 1200ML W/VALVE (MISCELLANEOUS) ×3 IMPLANT
CHLORAPREP W/TINT 26 (MISCELLANEOUS) ×3 IMPLANT
COVER WAND RF STERILE (DRAPES) ×3 IMPLANT
DRSG OPSITE POSTOP 3X4 (GAUZE/BANDAGES/DRESSINGS) ×3 IMPLANT
DRSG TEGADERM 6X8 (GAUZE/BANDAGES/DRESSINGS) IMPLANT
ELECT REM PT RETURN 9FT ADLT (ELECTROSURGICAL) ×3
ELECTRODE REM PT RTRN 9FT ADLT (ELECTROSURGICAL) ×1 IMPLANT
GAUZE SPONGE 4X4 12PLY STRL (GAUZE/BANDAGES/DRESSINGS) IMPLANT
GAUZE XEROFORM 1X8 LF (GAUZE/BANDAGES/DRESSINGS) ×3 IMPLANT
GLOVE BIOGEL PI IND STRL 9 (GLOVE) ×1 IMPLANT
GLOVE BIOGEL PI INDICATOR 9 (GLOVE) ×2
GLOVE SURG SYN 9.0  PF PI (GLOVE) ×2
GLOVE SURG SYN 9.0 PF PI (GLOVE) ×1 IMPLANT
GOWN SRG 2XL LVL 4 RGLN SLV (GOWNS) ×1 IMPLANT
GOWN STRL NON-REIN 2XL LVL4 (GOWNS) ×2
GOWN STRL REUS W/ TWL LRG LVL3 (GOWN DISPOSABLE) ×1 IMPLANT
GOWN STRL REUS W/TWL LRG LVL3 (GOWN DISPOSABLE) ×2
KIT TURNOVER KIT A (KITS) ×3 IMPLANT
NEEDLE FILTER BLUNT 18X 1/2SAF (NEEDLE) ×2
NEEDLE FILTER BLUNT 18X1 1/2 (NEEDLE) ×1 IMPLANT
NS IRRIG 500ML POUR BTL (IV SOLUTION) ×3 IMPLANT
PACK HIP COMPR (MISCELLANEOUS) ×3 IMPLANT
SCALPEL PROTECTED #10 DISP (BLADE) ×3 IMPLANT
SCREW CANN 32 THRD/90 7.3 (Screw) ×6 IMPLANT
SCREW CANN THREADED 7.3X85 (Screw) ×6 IMPLANT
STAPLER SKIN PROX 35W (STAPLE) ×3 IMPLANT
SUT PROLENE 2 0 FS (SUTURE) IMPLANT
SUT VIC AB 2-0 SH 27 (SUTURE) ×2
SUT VIC AB 2-0 SH 27XBRD (SUTURE) ×1 IMPLANT
SYR 5ML LL (SYRINGE) ×3 IMPLANT
TAPE MICROFOAM 4IN (TAPE) IMPLANT
WIRE G W/FLUTE 2.8X300 (MISCELLANEOUS) ×9 IMPLANT

## 2019-11-11 NOTE — Op Note (Signed)
11/11/2019  12:35 PM  PATIENT:  [name] Woods  79 y.o. female  PRE-OPERATIVE DIAGNOSIS:  Right Hip Fracture  POST-OPERATIVE DIAGNOSIS:  Right Hip Fracture  PROCEDURE:  Procedure(s): CANNULATED HIP PINNING (Right)  SURGEON: Laurene Footman, MD  ASSISTANTS: None  ANESTHESIA:   spinal  EBL:  No intake/output data recorded.  BLOOD ADMINISTERED:none  DRAINS: none   LOCAL MEDICATIONS USED:  NONE  SPECIMEN:  No Specimen  DISPOSITION OF SPECIMEN:  N/A  COUNTS:  YES  TOURNIQUET:  * No tourniquets in log *  IMPLANTS: 7.3 Synthes cannulated screws x4  DICTATION: .Dragon Dictation patient was brought to the operating room and after adequate general anesthesia was obtained she has transferred to the fracture table.  C arm was brought in and good visualization of the fracture was obtained in both AP and lateral projections with no displacement of the fracture from preop view.  The hip was then prepped and draped using a barrier drape method and appropriate patient identification and timeout procedures were completed.  Proximally 1-1/2 inch skin incision was made based on radiographic findings and muscle split down to the bone.  A soft tissue protector was placed and a guidewire was placed up and to the femoral head and a slightly anterior and inferior position.  Measurements made drilling tapping and the 7.3 cannulated screw inserted based on this initial wire using a guide a diamond type configuration was placed with 3 additional guidewires placed drilling tapping after measuring and placing the cannulated screws.  C arm views showed no penetration of metal or guidewire into the joint and all guidewires were removed after obtaining a permanent AP and lateral images.  The wound was irrigated and closed with 2-0 Vicryl subcutaneously and skin staples followed by Xeroform and honeycomb dressing.  PLAN OF CARE: Continue as inpatient  PATIENT DISPOSITION:  PACU - hemodynamically  stable.

## 2019-11-11 NOTE — Anesthesia Procedure Notes (Signed)
Procedure Name: Intubation Performed by: Fletcher-Harrison, Emarion Toral, CRNA Pre-anesthesia Checklist: Patient identified, Emergency Drugs available, Suction available and Patient being monitored Patient Re-evaluated:Patient Re-evaluated prior to induction Oxygen Delivery Method: Circle system utilized Preoxygenation: Pre-oxygenation with 100% oxygen Induction Type: IV induction Ventilation: Mask ventilation without difficulty Tube type: Oral Tube size: 6.5 mm Number of attempts: 1 Airway Equipment and Method: Stylet and Oral airway Placement Confirmation: ETT inserted through vocal cords under direct vision,  positive ETCO2,  breath sounds checked- equal and bilateral and CO2 detector Secured at: 21 cm Tube secured with: Tape Dental Injury: Teeth and Oropharynx as per pre-operative assessment        

## 2019-11-11 NOTE — Progress Notes (Signed)
Initial Nutrition Assessment  DOCUMENTATION CODES:   Non-severe (moderate) malnutrition in context of social or environmental circumstances  INTERVENTION:  Provide Ensure Enlive po BID, each supplement provides 350 kcal and 20 grams of protein. Patient prefers strawberry.  Encouraged adequate intake of calories and protein from meals.  NUTRITION DIAGNOSIS:   Moderate Malnutrition related to social / environmental circumstances (decreased appetite and intake following loss of husband) as evidenced by 10.3% weight loss over the past 4 months, mild fat depletion, mild muscle depletion.  GOAL:   Patient will meet greater than or equal to 90% of their needs  MONITOR:   PO intake, Supplement acceptance, Labs, Weight trends, I & O's  REASON FOR ASSESSMENT:   Malnutrition Screening Tool    ASSESSMENT:   79 year old female with PMHx of HTN, Guillain Barre syndrome, spinal stenosis admitted after a mechanical fall with right hip fracture s/p right cannulated hip pinning 7/7.   Met with patient and her sister at bedside. Patient reports her appetite has been decreased at home for the past 2 months after her husband passed away. She typically has a small amount at breakfast (fruit or string cheese). She then has a light lunch and light dinner. Patient reports she has tried Ensure in the past and did not like it. Discussed increased nutrient needs for post-operative healing. Patient is amenable to trying strawberry Ensure tomorrow to see if she likes it better now.  Patient reports her UBW was around 152 lbs and that she has been losing weight over the last several months. Per chart patient was 70.8 kg on 07/21/2019. She is now 63.5 kg (140 lbs). She has lost 7.3 kg (10.3% body weight) over the past 4 months, which is significant for time frame.  Medications reviewed and include: colace 100 mg BID, Protonix, senna 1 tablet BID, NS at 75 mL/hr.  Labs reviewed: sodium 126.  NUTRITION -  FOCUSED PHYSICAL EXAM:    Most Recent Value  Orbital Region Mild depletion  Upper Arm Region Mild depletion  Thoracic and Lumbar Region No depletion  Buccal Region Mild depletion  Temple Region Moderate depletion  Clavicle Bone Region Mild depletion  Clavicle and Acromion Bone Region Mild depletion  Scapular Bone Region Unable to assess  Dorsal Hand Severe depletion  Patellar Region No depletion  Anterior Thigh Region Mild depletion  Posterior Calf Region Mild depletion  Edema (RD Assessment) None  Hair Reviewed  Eyes Reviewed  Mouth Reviewed  Skin Reviewed  Nails Reviewed     Diet Order:   Diet Order            Diet regular Room service appropriate? Yes; Fluid consistency: Thin  Diet effective now                EDUCATION NEEDS:   No education needs have been identified at this time  Skin:  Skin Assessment: Skin Integrity Issues: Skin Integrity Issues:: Incisions Incisions: closed incision right hip  Last BM:  11/10/2019  Height:   Ht Readings from Last 1 Encounters:  11/10/19 _0  (1.6 m)   Weight:   Wt Readings from Last 1 Encounters:  11/10/19 63.5 kg   BMI:  Body mass index is 24.8 kg/m.  Estimated Nutritional Needs:   Kcal:  1600-1800  Protein:  80-90 grams  Fluid:  1.6-1.8 L/day  Jacklynn Barnacle, MS, RD, LDN Pager number available on Amion

## 2019-11-11 NOTE — Transfer of Care (Signed)
Immediate Anesthesia Transfer of Care Note  Patient: [name] Woods  Procedure(s) Performed: CANNULATED HIP PINNING (Right Hip)  Patient Location: PACU  Anesthesia Type:General  Level of Consciousness: awake and patient cooperative  Airway & Oxygen Therapy: Patient Spontanous Breathing and Patient connected to face mask oxygen  Post-op Assessment: Report given to RN and Post -op Vital signs reviewed and stable  Post vital signs: Reviewed and stable  Last Vitals:  Vitals Value Taken Time  BP 120/53 11/11/19 1242  Temp    Pulse 86 11/11/19 1245  Resp 14 11/11/19 1245  SpO2 96 % 11/11/19 1245  Vitals shown include unvalidated device data.  Last Pain:  Vitals:   11/11/19 1117  TempSrc: Temporal  PainSc: 1          Complications: No complications documented.

## 2019-11-11 NOTE — Progress Notes (Signed)
Patient is resting comfortably.  No change in exam no rotation of the leg to suggest displacement of the fracture. I discussed with her that an years of practice have only seen a very rare case with the fracture displaces while they have been at bedrest and plan on right hip pinning later today.  She understands this.  Site marked.

## 2019-11-11 NOTE — Anesthesia Preprocedure Evaluation (Signed)
Anesthesia Evaluation  Patient identified by MRN, date of birth, ID band Patient awake    Reviewed: Allergy & Precautions, H&P , NPO status , Patient's Chart, lab work & pertinent test results  Airway Mallampati: II  TM Distance: >3 FB Neck ROM: limited    Dental  (+) Teeth Intact   Pulmonary neg pulmonary ROS, neg shortness of breath, neg COPD,    Pulmonary exam normal breath sounds clear to auscultation       Cardiovascular hypertension, (-) angina(-) Past MI and (-) Cardiac Stents Normal cardiovascular exam(-) dysrhythmias  Rhythm:regular Rate:Normal     Neuro/Psych Anxiety Peripheral neuropathy - numbness b/l lower legs H/o spinal stenosis s/p laminectomy H/o Guillan-Barre Syndrome    GI/Hepatic negative GI ROS, Neg liver ROS,   Endo/Other  negative endocrine ROS  Renal/GU      Musculoskeletal   Abdominal   Peds  Hematology negative hematology ROS (+)   Anesthesia Other Findings Past Medical History: No date: Chest pain     Comment:  a. 10/2014 St Echo: nl LV fxn, no wma/ischemia. No date: Guillain Barr syndrome [facility]) 1980: Guillain Barr syndrome ([place]) No date: Hypertension No date: Spinal stenosis  Past Surgical History: No date: ABDOMINAL HYSTERECTOMY 2006: AUGMENTATION MAMMAPLASTY No date: BACK SURGERY 2016: CERVICAL SPINE SURGERY; N/A     Comment:  for myelopathy No date: LUMBAR DISC SURGERY  BMI    Body Mass Index: 24.80 kg/m      Reproductive/Obstetrics negative OB ROS                             Anesthesia Physical Anesthesia Plan  ASA: II  Anesthesia Plan: General ETT   Post-op Pain Management:    Induction:   PONV Risk Score and Plan: Ondansetron, Dexamethasone and Treatment may vary due to age or medical condition  Airway Management Planned:   Additional Equipment:   Intra-op Plan:   Post-operative Plan:   Informed Consent: I have reviewed the  patients History and Physical, chart, labs and discussed the procedure including the risks, benefits and alternatives for the proposed anesthesia with the patient or authorized representative who has indicated his/her understanding and acceptance.     Dental Advisory Given  Plan Discussed with: Anesthesiologist, CRNA and Surgeon  Anesthesia Plan Comments: (Discussed risks/benefits of spinal vs GETA.  Pt prefers to avoid spinal due to h/o significant neuropathy.  Will proceed with GETA)        Anesthesia Quick Evaluation

## 2019-11-11 NOTE — Progress Notes (Signed)
PROGRESS NOTE    [name] Woods  TZG:[id number] DOB: April 04, 1941 DOA: 11/10/2019 PCP: Kirk Ruths, MD    Brief Narrative:  [name] Woods y.o.femalewith a known history of hypertension, spinal stenosis status post back surgery, history of Guillain-Barr syndrome in 1978, comes to the emergency room after she had a mechanical fall when she turned around to quick lost balance fell. Patient went in her apartment.  She had hip pain.Admitted for acute right femoral neck fracture, orthopedics following    Consultants:   Orthopedics  Procedures:   Antimicrobials:   Ceftezole   Subjective: Has no complaints this am. On her way to surgery.  Objective: Vitals:   11/11/19 1424 11/11/19 1534 11/11/19 1634 11/11/19 1726  BP: (!) 131/56 (!) 128/56 136/63 (!) 142/60  Pulse: 76 69 76 77  Resp: 17 16 17 17   Temp: (!) 97.5 F (36.4 C) 97.8 F (36.6 C) 98.1 F (36.7 C) 97.9 F (36.6 C)  TempSrc:      SpO2: 93% 96% 95%   Weight:      Height:        Intake/Output Summary (Last 24 hours) at 11/11/2019 1821 Last data filed at 11/11/2019 1600 Gross per 24 hour  Intake 1538.01 ml  Output 470 ml  Net 1068.01 ml   Filed Weights   11/10/19 1412  Weight: 63.5 kg    Examination:  General exam: Appears calm and comfortable  Respiratory system: Clear to auscultation. Respiratory effort normal. Cardiovascular system: S1 & S2 heard, RRR. No JVD, murmurs, rubs, gallops or clicks. No pedal edema. Gastrointestinal system: Abdomen is nondistended, soft and nontender. No organomegaly or masses felt. Normal bowel sounds heard. Central nervous system: Alert and oriented. No focal neurological deficits. Extremities: Symmetric 5 x 5 power. Skin: No rashes, lesions or ulcers Psychiatry: Judgement and insight appear normal. Mood & affect appropriate.     Data Reviewed: I have personally reviewed following labs and imaging studies  CBC: Recent Labs  Lab 11/10/19 1635  11/11/19 0951  WBC 18.3* 9.9  NEUTROABS 15.3*  --   HGB 14.1 12.0  HCT 39.5 33.1*  MCV 87.2 85.8  PLT 291 759   Basic Metabolic Panel: Recent Labs  Lab 11/10/19 1635 11/11/19 0944 11/11/19 1420  NA 124* 126*  --   K 4.0  --   --   CL 88*  --   --   CO2 24  --   --   GLUCOSE 109*  --   --   BUN 7*  --   --   CREATININE 0.75  --  0.74  CALCIUM 9.3  --   --    GFR: Estimated Creatinine Clearance: 52 mL/min (by C-G formula based on SCr of 0.74 mg/dL). Liver Function Tests: Recent Labs  Lab 11/10/19 1635  AST 24  ALT 17  ALKPHOS 48  BILITOT 1.3*  PROT 7.0  ALBUMIN 4.3   No results for input(s): LIPASE, AMYLASE in the last 168 hours. No results for input(s): AMMONIA in the last 168 hours. Coagulation Profile: No results for input(s): INR, PROTIME in the last 168 hours. Cardiac Enzymes: No results for input(s): CKTOTAL, CKMB, CKMBINDEX, TROPONINI in the last 168 hours. BNP (last 3 results) No results for input(s): PROBNP in the last 8760 hours. HbA1C: No results for input(s): HGBA1C in the last 72 hours. CBG: No results for input(s): GLUCAP in the last 168 hours. Lipid Profile: No results for input(s): CHOL, HDL, LDLCALC, TRIG, CHOLHDL, LDLDIRECT in the last  72 hours. Thyroid Function Tests: No results for input(s): TSH, T4TOTAL, FREET4, T3FREE, THYROIDAB in the last 72 hours. Anemia Panel: No results for input(s): VITAMINB12, FOLATE, FERRITIN, TIBC, IRON, RETICCTPCT in the last 72 hours. Sepsis Labs: No results for input(s): PROCALCITON, LATICACIDVEN in the last 168 hours.  Recent Results (from the past 240 hour(s))  SARS Coronavirus 2 by RT PCR (hospital order, performed in [facility] hospital lab) Nasopharyngeal Nasopharyngeal Swab     Status: None   Collection Time: 11/10/19  6:00 PM   Specimen: Nasopharyngeal Swab  Result Value Ref Range Status   SARS Coronavirus 2 NEGATIVE NEGATIVE Final    Comment: (NOTE) SARS-CoV-2 target nucleic acids are NOT  DETECTED.  The SARS-CoV-2 RNA is generally detectable in upper and lower respiratory specimens during the acute phase of infection. The lowest concentration of SARS-CoV-2 viral copies this assay can detect is 250 copies / mL. A negative result does not preclude SARS-CoV-2 infection and should not be used as the sole basis for treatment or other patient management decisions.  A negative result may occur with improper specimen collection / handling, submission of specimen other than nasopharyngeal swab, presence of viral mutation(s) within the areas targeted by this assay, and inadequate number of viral copies (<250 copies / mL). A negative result must be combined with clinical observations, patient history, and epidemiological information.  Fact Sheet for Patients:   StrictlyIdeas.no  Fact Sheet for Healthcare Providers: BankingDealers.co.za  This test is not yet approved or  cleared by the [place] FDA and has been authorized for detection and/or diagnosis of SARS-CoV-2 by FDA under an Emergency Use Authorization (EUA).  This EUA will remain in effect (meaning this test can be used) for the duration of the COVID-19 declaration under Section 564(b)(1) of the Act, 21 U.S.C. section 360bbb-3(b)(1), unless the authorization is terminated or revoked sooner.  Performed at [facility], [address] [place], [place] [address]   Surgical pcr screen     Status: None   Collection Time: 11/11/19  1:36 AM   Specimen: Nasal Mucosa; Nasal Swab  Result Value Ref Range Status   MRSA, PCR NEGATIVE NEGATIVE Final   Staphylococcus aureus NEGATIVE NEGATIVE Final    Comment: (NOTE) The Xpert SA Assay (FDA approved for NASAL specimens in patients 52 years of age and older), is one component of a comprehensive surveillance program. It is not intended to diagnose infection nor to guide or monitor treatment. Performed at [facility], [address], [place], [place] [address]          Radiology Studies: DG Chest 1 View  Result Date: 11/10/2019 CLINICAL DATA:  Preoperative evaluation. EXAM: CHEST  1 VIEW COMPARISON:  None. FINDINGS: Mild diffuse chronic appearing increased lung markings are seen. There is no evidence of acute infiltrate, pleural effusion or pneumothorax. The heart size and mediastinal contours are within normal limits. Degenerative changes seen throughout the thoracic spine. IMPRESSION: No active disease. Electronically Signed   By: Virgina Norfolk M.D.   On: 11/10/2019 16:43   DG Elbow Complete Right  Result Date: 11/10/2019 CLINICAL DATA:  Pain post fall EXAM: RIGHT ELBOW - COMPLETE 3+ VIEW COMPARISON:  None. FINDINGS: There is no evidence of fracture, dislocation, or joint effusion. There is no evidence of arthropathy or other focal bone abnormality. Soft tissues are unremarkable. IMPRESSION: Negative. Electronically Signed   By: Donavan Foil M.D.   On: 11/10/2019 16:07   DG Ankle Complete Right  Result Date:  11/10/2019 CLINICAL DATA:  Pain post fall EXAM: RIGHT ANKLE - COMPLETE 3+ VIEW COMPARISON:  None. FINDINGS: No fracture or malalignment. Small plantar calcaneal spur. Ankle mortise appears symmetric. IMPRESSION: No acute osseous abnormality. Electronically Signed   By: Donavan Foil M.D.   On: 11/10/2019 16:05   DG HIP OPERATIVE UNILAT WITH PELVIS RIGHT  Result Date: 11/11/2019 CLINICAL DATA:  Intraoperative evaluation. EXAM: OPERATIVE RIGHT HIP (WITH PELVIS IF PERFORMED)  VIEWS TECHNIQUE: Fluoroscopic spot image(s) were submitted for interpretation post-operatively. COMPARISON:  None. FINDINGS: Acute fracture deformity is seen involving the dorsal aspect of the neck of the proximal right femur. There is no evidence of dislocation. Mild to moderate severity degenerative changes are noted within the left hip. Soft tissue structures are unremarkable. IMPRESSION: Acute fracture of the  proximal right femur. Electronically Signed   By: Virgina Norfolk M.D.   On: 11/11/2019 15:25   DG HIP UNILAT WITH PELVIS 2-3 VIEWS RIGHT  Result Date: 11/10/2019 CLINICAL DATA:  Fall EXAM: DG HIP (WITH OR WITHOUT PELVIS) 2-3V RIGHT COMPARISON:  None. FINDINGS: Decreased osseous mineralization. There is a fracture through the right femoral neck with impaction and mild displacement. Joint spaces are preserved. No dislocation. IMPRESSION: Acute impacted and mildly displaced right subcapital femoral neck fracture. Electronically Signed   By: Macy Mis M.D.   On: 11/10/2019 16:05        Scheduled Meds: . carvedilol  12.5 mg Oral q morning - 10a  . carvedilol  25 mg Oral Q1500  . docusate sodium  100 mg Oral BID  . [START ON 11/12/2019] enoxaparin (LOVENOX) injection  40 mg Subcutaneous Q24H  . [START ON 11/12/2019] escitalopram  10 mg Oral Daily  . estradiol  1 mg Oral Daily  . [START ON 11/12/2019] feeding supplement (ENSURE ENLIVE)  237 mL Oral BID BM  . fentaNYL      . hydrALAZINE  50 mg Oral BID  . losartan  100 mg Oral Daily  . pantoprazole  40 mg Oral Daily  . senna  1 tablet Oral BID  . torsemide  5 mg Oral Daily   Continuous Infusions: . sodium chloride 75 mL/hr at 11/11/19 1019  . sodium chloride 75 mL/hr at 11/11/19 1556  .  ceFAZolin (ANCEF) IV      Assessment & Plan:   Active Problems:   Hip fracture (HCC)   1.s/p mechanical fall with  right hip fracture- Plan for surgery today- -patient reports she does not tolerate narcotics well. Will do Tylenol and Ultram for pain -as needed Toradol    2.Essential Hypertension -continue Coreg, hydralazine, losartan, torsemide Currently overall controlled  3.Generalized anxiety disorder -patient was started on some anxiety meds per Dr. Lenny Pastel medication reconciliation to be done and resume it  4. History of spinal stenosis with surgery in the past  5.Hyponatremia- etiology unclear. Possibly due to  volume depletion. Improving with ivf. Continue to monitor without overcorrecting too fast. Ck am labs  DVT prophylaxis: lovenox Code Status: Full Family Communication: None at bedside Disposition Plan: Rehab versus home Status is: Inpatient  Remains inpatient appropriate because:Ongoing diagnostic testing needed not appropriate for outpatient work up   Dispo: The patient is from: Home              Anticipated d/c is to: Home versus rehab              Anticipated d/c date is: 2 days  Patient currently is not medically stable to d/c.            LOS: 1 day   Time spent:35 min with >50% on coc    Nolberto Hanlon, MD [facility] Pager [phone]  If 7PM-7AM, please contact night-coverage [email or web address] Password [facility] 11/11/2019, 6:21 PM

## 2019-11-12 ENCOUNTER — Encounter: Payer: Self-pay | Admitting: Orthopedic Surgery

## 2019-11-12 LAB — CBC
HCT: 31.8 % — ABNORMAL LOW (ref 36.0–46.0)
Hemoglobin: 11.7 g/dL — ABNORMAL LOW (ref 12.0–15.0)
MCH: 31.9 pg (ref 26.0–34.0)
MCHC: 36.8 g/dL — ABNORMAL HIGH (ref 30.0–36.0)
MCV: 86.6 fL (ref 80.0–100.0)
Platelets: 240 10*3/uL (ref 150–400)
RBC: 3.67 MIL/uL — ABNORMAL LOW (ref 3.87–5.11)
RDW: 13.1 % (ref 11.5–15.5)
WBC: 11 10*3/uL — ABNORMAL HIGH (ref 4.0–10.5)
nRBC: 0 % (ref 0.0–0.2)

## 2019-11-12 LAB — SARS CORONAVIRUS 2 BY RT PCR (HOSPITAL ORDER, PERFORMED IN ~~LOC~~ HOSPITAL LAB): SARS Coronavirus 2: NEGATIVE

## 2019-11-12 LAB — BASIC METABOLIC PANEL
Anion gap: 9 (ref 5–15)
BUN: 10 mg/dL (ref 8–23)
CO2: 22 mmol/L (ref 22–32)
Calcium: 8.6 mg/dL — ABNORMAL LOW (ref 8.9–10.3)
Chloride: 96 mmol/L — ABNORMAL LOW (ref 98–111)
Creatinine, Ser: 0.69 mg/dL (ref 0.44–1.00)
GFR calc Af Amer: >60 mL/min (ref >60)
GFR calc non Af Amer: >60 mL/min (ref >60)
Glucose, Bld: 118 mg/dL — ABNORMAL HIGH (ref 70–99)
Potassium: 4.3 mmol/L (ref 3.5–5.1)
Sodium: 127 mmol/L — ABNORMAL LOW (ref 135–145)

## 2019-11-12 MED ORDER — SODIUM CHLORIDE 0.9 % IV SOLN: INTRAVENOUS | Status: DC

## 2019-11-12 MED ORDER — ENOXAPARIN SODIUM 40 MG/0.4ML ~~LOC~~ SOLN: 40.0000 mg | SUBCUTANEOUS | 0 refills | Status: DC

## 2019-11-12 MED ORDER — LOSARTAN POTASSIUM 25 MG PO TABS
25.0000 mg | ORAL_TABLET | Freq: Every day | ORAL | Status: DC
  Administered 2019-11-13: 25 mg via ORAL
  Filled 2019-11-12: qty 1

## 2019-11-12 MED ORDER — TRAMADOL HCL 50 MG PO TABS: 50.0000 mg | ORAL_TABLET | Freq: Four times a day (QID) | ORAL | 0 refills | Status: DC | PRN

## 2019-11-12 MED ORDER — MELATONIN 5 MG PO TABS
5.0000 mg | ORAL_TABLET | Freq: Every day | ORAL | Status: DC
  Administered 2019-11-12: 5 mg via ORAL
  Filled 2019-11-12: qty 1

## 2019-11-12 MED ORDER — CARVEDILOL 3.125 MG PO TABS
3.1250 mg | ORAL_TABLET | Freq: Every morning | ORAL | Status: DC
  Administered 2019-11-13: 3.125 mg via ORAL
  Filled 2019-11-12: qty 1

## 2019-11-12 NOTE — Plan of Care (Signed)
  Problem: Education: Goal: Knowledge of General Education information will improve Description Including pain rating scale, medication(s)/side effects and non-pharmacologic comfort measures Outcome: Progressing   

## 2019-11-12 NOTE — Progress Notes (Signed)
  Subjective: 1 Day Post-Op Procedure(s) (LRB): CANNULATED HIP PINNING (Right) Patient reports pain as 0 on 0-10 scale.   Patient is well, and has had no acute complaints or problems PT and care management to assist with discharge planning, patient lives at [facility]. Negative for chest pain and shortness of breath Fever: no Gastrointestinal:Negative for nausea and vomiting  Objective: Vital signs in last 24 hours: Temp:  [97.3 F (36.3 C)-99.1 F (37.3 C)] 98.3 F (36.8 C) (07/08 0415) Pulse Rate:  [69-89] 83 (07/08 0415) Resp:  [9-18] 18 (07/08 0415) BP: (106-142)/(43-65) 128/62 (07/08 0415) SpO2:  [87 %-100 %] 98 % (07/08 0415)  Intake/Output from previous day:  Intake/Output Summary (Last 24 hours) at 11/12/2019 0801 Last data filed at 11/12/2019 0400 Gross per 24 hour  Intake 2450.34 ml  Output 470 ml  Net 1980.34 ml    Intake/Output this shift: No intake/output data recorded.  Labs: Recent Labs    11/10/19 1635 11/11/19 0951 11/12/19 0549  HGB 14.1 12.0 11.7*   Recent Labs    11/11/19 0951 11/12/19 0549  WBC 9.9 11.0*  RBC 3.86* 3.67*  HCT 33.1* 31.8*  PLT 221 240   Recent Labs    11/10/19 1635 11/10/19 1635 11/11/19 0944 11/11/19 1420 11/12/19 0549  NA 124*   < > 126*  --  127*  K 4.0  --   --   --  4.3  CL 88*  --   --   --  96*  CO2 24  --   --   --  22  BUN 7*  --   --   --  10  CREATININE 0.75   < >  --  0.74 0.69  GLUCOSE 109*  --   --   --  118*  CALCIUM 9.3  --   --   --  8.6*   < > = values in this interval not displayed.   No results for input(s): LABPT, INR in the last 72 hours.   EXAM General - Patient is Alert, Appropriate and Oriented Extremity - ABD soft Sensation intact distally Intact pulses distally Dorsiflexion/Plantar flexion intact Incision: dressing C/D/I No cellulitis present Dressing/Incision - clean, dry, no drainage Motor Function - intact, moving foot and toes well on exam.  Negative homans to the right  leg.  Past Medical History:  Diagnosis Date  . Chest pain    a. 10/2014 St Echo: nl LV fxn, no wma/ischemia.  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 Guillain Barr syndrome ([place]) 1980  . Hypertension   . Spinal stenosis     Assessment/Plan: 1 Day Post-Op Procedure(s) (LRB): CANNULATED HIP PINNING (Right) Active Problems:   Hip fracture (HCC)  Estimated body mass index is 24.8 kg/m as calculated from the following:   Height as of this encounter: 5\' 3"  (1.6 m).   Weight as of this encounter: 63.5 kg. Advance diet Up with therapy D/C IV fluids when tolerating po intake.  Labs reviewed this AM. Na 127.  Encouraged increased oral intake. Begin working on BM.   Up with therapy today.  Hopeful for discharge tomorrow.  DVT Prophylaxis - Lovenox, Foot Pumps and TED hose Weight-Bearing as tolerated to right leg  J. Cameron Proud, PA-C [facility] 11/12/2019, 8:01 AM

## 2019-11-12 NOTE — Progress Notes (Signed)
Physical Therapy Treatment Patient Details Name: [name] Woods MRN: [id number] DOB: March 30, 1941 Today's Date: 11/12/2019    History of Present Illness [name] Woods  is a 79 y.o. female with a known history of hypertension, spinal stenosis status post back surgery, history of Guillain-Barr syndrome in 1978, comes to the emergency room after she had a mechanical fall when she turned around to quick lost balance fell. S/P right hip pinning. WBAT.    PT Comments    Patient received in recliner, reports she is ready for PT. Minimal pain. Ready to get back into bed. Patient requires min assist for sit to stand. Cues to ensure she is balanced prior to walking. Cues needed for sequencing with walker. She is able to ambulate 75 feet with RW, slight unsteadiness with one lob requiring min assist to regain. She will continue to benefit from skilled PT while here to improve safety with mobility.    Follow Up Recommendations  SNF;Supervision/Assistance - 24 hour     Equipment Recommendations  None recommended by PT    Recommendations for Other Services       Precautions / Restrictions Precautions Precautions: Fall Restrictions Weight Bearing Restrictions: Yes RLE Weight Bearing: Weight bearing as tolerated    Mobility  Bed Mobility Overal bed mobility: Needs Assistance Bed Mobility: Sit to Supine       Sit to supine: Min assist   General bed mobility comments: required assistance to bring LEs back up onto bed  Transfers Overall transfer level: Needs assistance Equipment used: Rolling walker (2 wheeled) Transfers: Sit to/from Stand Sit to Stand: Min guard         General transfer comment: intial unsteadiness with standing balance  Ambulation/Gait Ambulation/Gait assistance: Min assist Gait Distance (Feet): 75 Feet Assistive device: Rolling walker (2 wheeled) Gait Pattern/deviations: Step-to pattern;Decreased stride length;Narrow base of support Gait velocity:  decr   General Gait Details: slight unsteadiness with mobility, one lob requiring min assist to regain. Minimal pain with mobility   Stairs             Wheelchair Mobility    Modified Rankin (Stroke Patients Only)       Balance Overall balance assessment: Needs assistance Sitting-balance support: Feet supported Sitting balance-Leahy Scale: Good     Standing balance support: Bilateral upper extremity supported;During functional activity Standing balance-Leahy Scale: Poor Standing balance comment: one lob with ambulation requiring min assist to regain. Requires min guard to min assist for safety with mobility                            Cognition Arousal/Alertness: Awake/alert Behavior During Therapy: WFL for tasks assessed/performed Overall Cognitive Status: Within Functional Limits for tasks assessed                                        Exercises Total Joint Exercises Ankle Circles/Pumps: AROM;Both;10 reps Heel Slides: AROM;Right;10 reps Hip ABduction/ADduction: AROM;10 reps;Right Straight Leg Raises: AROM;Right;10 reps    General Comments        Pertinent Vitals/Pain Pain Assessment: 0-10 Pain Score: 3  Pain Location: R hip with mobility Pain Descriptors / Indicators: Discomfort Pain Intervention(s): Monitored during session;Limited activity within patient's tolerance;Repositioned    Home Living                      Prior Function  PT Goals (current goals can now be found in the care plan section) Acute Rehab PT Goals Patient Stated Goal: to go to rehab at [facility] prior to returning home alone PT Goal Formulation: With patient Time For Goal Achievement: 11/19/19 Potential to Achieve Goals: Good Progress towards PT goals: Progressing toward goals    Frequency    BID      PT Plan Current plan remains appropriate    Co-evaluation              AM-PAC PT "6 Clicks" Mobility    Outcome Measure  Help needed turning from your back to your side while in a flat bed without using bedrails?: None Help needed moving from lying on your back to sitting on the side of a flat bed without using bedrails?: A Little Help needed moving to and from a bed to a chair (including a wheelchair)?: A Little Help needed standing up from a chair using your arms (e.g., wheelchair or bedside chair)?: A Little Help needed to walk in hospital room?: A Little Help needed climbing 3-5 steps with a railing? : A Lot 6 Click Score: 18    End of Session Equipment Utilized During Treatment: Gait belt Activity Tolerance: Patient tolerated treatment well;Patient limited by fatigue;Patient limited by pain Patient left: in bed;with bed alarm set;with SCD's reapplied;with call bell/phone within reach Nurse Communication: Mobility status PT Visit Diagnosis: Unsteadiness on feet (R26.81);Muscle weakness (generalized) (M62.81);Difficulty in walking, not elsewhere classified (R26.2);Pain;History of falling (Z91.81) Pain - Right/Left: Right Pain - part of body: Hip     Time: 1305-1340 PT Time Calculation (min) (ACUTE ONLY): 35 min  Charges:  $Gait Training: 8-22 mins $Therapeutic Exercise: 8-22 mins                     Trevione Wert, PT, GCS 11/12/19,1:51 PM

## 2019-11-12 NOTE — Progress Notes (Signed)
PROGRESS NOTE    [name] Woods  JME:[id number] DOB: 19-Apr-1941 DOA: 11/10/2019 PCP: Kirk Ruths, MD    Brief Narrative:  [name] y.o.femalewith a known history of hypertension, spinal stenosis status post back surgery, history of Guillain-Barr syndrome in 1978, comes to the emergency room after she had a mechanical fall when she turned around to quick lost balance fell. Patient went in her apartment.  She had hip pain.Admitted for acute right femoral neck fracture, orthopedics following    Consultants:   Orthopedics  Procedures:   Antimicrobials:   Ceftezole   Subjective: Has no complaints this am. PT at bedside stated she did well.  Patient eating and sister at bedside.  Objective: Vitals:   11/11/19 2026 11/11/19 2356 11/12/19 0415 11/12/19 0815  BP: (!) 106/43 (!) 110/54 128/62 140/67  Pulse: 79 78 83 80  Resp: 18 18 18 17   Temp: 97.8 F (36.6 C) 98.6 F (37 C) 98.3 F (36.8 C) 97.8 F (36.6 C)  TempSrc: Oral Oral Oral   SpO2: 95% 96% 98% 97%  Weight:      Height:        Intake/Output Summary (Last 24 hours) at 11/12/2019 0824 Last data filed at 11/12/2019 0400 Gross per 24 hour  Intake 2450.34 ml  Output 470 ml  Net 1980.34 ml   Filed Weights   11/10/19 1412  Weight: 63.5 kg    Examination:  General exam: Appears calm and comfortable, eating breakfast NAD Respiratory system: Clear to auscultation. Respiratory effort normal. Cardiovascular system: S1 & S2 heard, RRR. No JVD, murmurs, rubs, gallops or clicks.  Gastrointestinal system: Abdomen is nondistended, soft and nontender.Normal bowel sounds heard. Central nervous system: Alert and oriented X3 grossly intact Extremities: No edema Skin: Warm dry  Psychiatry: Judgement and insight appear normal. Mood & affect appropriate.     Data Reviewed: I have personally reviewed following labs and imaging studies  CBC: Recent Labs  Lab 11/10/19 1635 11/11/19 0951  11/12/19 0549  WBC 18.3* 9.9 11.0*  NEUTROABS 15.3*  --   --   HGB 14.1 12.0 11.7*  HCT 39.5 33.1* 31.8*  MCV 87.2 85.8 86.6  PLT 291 221 229   Basic Metabolic Panel: Recent Labs  Lab 11/10/19 1635 11/11/19 0944 11/11/19 1420 11/12/19 0549  NA 124* 126*  --  127*  K 4.0  --   --  4.3  CL 88*  --   --  96*  CO2 24  --   --  22  GLUCOSE 109*  --   --  118*  BUN 7*  --   --  10  CREATININE 0.75  --  0.74 0.69  CALCIUM 9.3  --   --  8.6*   GFR: Estimated Creatinine Clearance: 52 mL/min (by C-G formula based on SCr of 0.69 mg/dL). Liver Function Tests: Recent Labs  Lab 11/10/19 1635  AST 24  ALT 17  ALKPHOS 48  BILITOT 1.3*  PROT 7.0  ALBUMIN 4.3   No results for input(s): LIPASE, AMYLASE in the last 168 hours. No results for input(s): AMMONIA in the last 168 hours. Coagulation Profile: No results for input(s): INR, PROTIME in the last 168 hours. Cardiac Enzymes: No results for input(s): CKTOTAL, CKMB, CKMBINDEX, TROPONINI in the last 168 hours. BNP (last 3 results) No results for input(s): PROBNP in the last 8760 hours. HbA1C: No results for input(s): HGBA1C in the last 72 hours. CBG: No results for input(s): GLUCAP in the last 168 hours.  Lipid Profile: No results for input(s): CHOL, HDL, LDLCALC, TRIG, CHOLHDL, LDLDIRECT in the last 72 hours. Thyroid Function Tests: No results for input(s): TSH, T4TOTAL, FREET4, T3FREE, THYROIDAB in the last 72 hours. Anemia Panel: No results for input(s): VITAMINB12, FOLATE, FERRITIN, TIBC, IRON, RETICCTPCT in the last 72 hours. Sepsis Labs: No results for input(s): PROCALCITON, LATICACIDVEN in the last 168 hours.  Recent Results (from the past 240 hour(s))  SARS Coronavirus 2 by RT PCR (hospital order, performed in [facility] hospital lab) Nasopharyngeal Nasopharyngeal Swab     Status: None   Collection Time: 11/10/19  6:00 PM   Specimen: Nasopharyngeal Swab  Result Value Ref Range Status   SARS Coronavirus 2 NEGATIVE  NEGATIVE Final    Comment: (NOTE) SARS-CoV-2 target nucleic acids are NOT DETECTED.  The SARS-CoV-2 RNA is generally detectable in upper and lower respiratory specimens during the acute phase of infection. The lowest concentration of SARS-CoV-2 viral copies this assay can detect is 250 copies / mL. A negative result does not preclude SARS-CoV-2 infection and should not be used as the sole basis for treatment or other patient management decisions.  A negative result may occur with improper specimen collection / handling, submission of specimen other than nasopharyngeal swab, presence of viral mutation(s) within the areas targeted by this assay, and inadequate number of viral copies (<250 copies / mL). A negative result must be combined with clinical observations, patient history, and epidemiological information.  Fact Sheet for Patients:   StrictlyIdeas.no  Fact Sheet for Healthcare Providers: BankingDealers.co.za  This test is not yet approved or  cleared by the [place] FDA and has been authorized for detection and/or diagnosis of SARS-CoV-2 by FDA under an Emergency Use Authorization (EUA).  This EUA will remain in effect (meaning this test can be used) for the duration of the COVID-19 declaration under Section 564(b)(1) of the Act, 21 U.S.C. section 360bbb-3(b)(1), unless the authorization is terminated or revoked sooner.  Performed at [facility], [address] [place], [place] [address]   Surgical pcr screen     Status: None   Collection Time: 11/11/19  1:36 AM   Specimen: Nasal Mucosa; Nasal Swab  Result Value Ref Range Status   MRSA, PCR NEGATIVE NEGATIVE Final   Staphylococcus aureus NEGATIVE NEGATIVE Final    Comment: (NOTE) The Xpert SA Assay (FDA approved for NASAL specimens in patients 62 years of age and older), is one component of a comprehensive surveillance program. It is not intended to diagnose  infection nor to guide or monitor treatment. Performed at [facility], [address], [place], [place] [address]          Radiology Studies: DG Chest 1 View  Result Date: 11/10/2019 CLINICAL DATA:  Preoperative evaluation. EXAM: CHEST  1 VIEW COMPARISON:  None. FINDINGS: Mild diffuse chronic appearing increased lung markings are seen. There is no evidence of acute infiltrate, pleural effusion or pneumothorax. The heart size and mediastinal contours are within normal limits. Degenerative changes seen throughout the thoracic spine. IMPRESSION: No active disease. Electronically Signed   By: Virgina Norfolk M.D.   On: 11/10/2019 16:43   DG Elbow Complete Right  Result Date: 11/10/2019 CLINICAL DATA:  Pain post fall EXAM: RIGHT ELBOW - COMPLETE 3+ VIEW COMPARISON:  None. FINDINGS: There is no evidence of fracture, dislocation, or joint effusion. There is no evidence of arthropathy or other focal bone abnormality. Soft tissues are unremarkable. IMPRESSION: Negative. Electronically Signed   By: Donavan Foil  M.D.   On: 11/10/2019 16:07   DG Ankle Complete Right  Result Date: 11/10/2019 CLINICAL DATA:  Pain post fall EXAM: RIGHT ANKLE - COMPLETE 3+ VIEW COMPARISON:  None. FINDINGS: No fracture or malalignment. Small plantar calcaneal spur. Ankle mortise appears symmetric. IMPRESSION: No acute osseous abnormality. Electronically Signed   By: Donavan Foil M.D.   On: 11/10/2019 16:05   DG HIP OPERATIVE UNILAT WITH PELVIS RIGHT  Result Date: 11/11/2019 CLINICAL DATA:  Intraoperative evaluation. EXAM: OPERATIVE RIGHT HIP (WITH PELVIS IF PERFORMED)  VIEWS TECHNIQUE: Fluoroscopic spot image(s) were submitted for interpretation post-operatively. COMPARISON:  None. FINDINGS: Acute fracture deformity is seen involving the dorsal aspect of the neck of the proximal right femur. There is no evidence of dislocation. Mild to moderate severity degenerative changes are noted within the left hip. Soft  tissue structures are unremarkable. IMPRESSION: Acute fracture of the proximal right femur. Electronically Signed   By: Virgina Norfolk M.D.   On: 11/11/2019 15:25   DG HIP UNILAT WITH PELVIS 2-3 VIEWS RIGHT  Result Date: 11/10/2019 CLINICAL DATA:  Fall EXAM: DG HIP (WITH OR WITHOUT PELVIS) 2-3V RIGHT COMPARISON:  None. FINDINGS: Decreased osseous mineralization. There is a fracture through the right femoral neck with impaction and mild displacement. Joint spaces are preserved. No dislocation. IMPRESSION: Acute impacted and mildly displaced right subcapital femoral neck fracture. Electronically Signed   By: Macy Mis M.D.   On: 11/10/2019 16:05        Scheduled Meds: . carvedilol  12.5 mg Oral q morning - 10a  . carvedilol  25 mg Oral Q1500  . docusate sodium  100 mg Oral BID  . enoxaparin (LOVENOX) injection  40 mg Subcutaneous Q24H  . escitalopram  10 mg Oral Daily  . estradiol  1 mg Oral Daily  . feeding supplement (ENSURE ENLIVE)  237 mL Oral BID BM  . hydrALAZINE  50 mg Oral BID  . losartan  100 mg Oral Daily  . pantoprazole  40 mg Oral Daily  . senna  1 tablet Oral BID  . torsemide  5 mg Oral Daily   Continuous Infusions: . sodium chloride    .  ceFAZolin (ANCEF) IV 1 g (11/12/19 0536)    Assessment & Plan:   Active Problems:   Hip fracture (HCC)   1.s/p mechanical fall with  right hip fracture- Status post right hip pinning on 7/7 by Dr. Rudene Christians -patient reports she does not tolerate narcotics well. Will do Tylenol and Ultram for pain -as needed Toradol PT-SNF.     2.Essential Hypertension -continue Coreg, hydralazine, losartan Currently overall controlled Holding torsemide for decreased Na.  3.Generalized anxiety disorder -patient was started on some anxiety meds per Dr. Lenny Pastel medication reconciliation to be done and resume it  4. History of spinal stenosis with surgery in the past  5.Hyponatremia- etiology unclear. Possibly due to  volume depletion. Improving with ivf. Continue to monitor without overcorrecting too fast. Hold torsemide  DVT prophylaxis: lovenox Code Status: Full Family Communication: None at bedside Disposition Plan: SNF Status is: Inpatient  Remains inpatient appropriate because:Ongoing diagnostic testing needed not appropriate for outpatient work up   Dispo: The patient is from: Home              Anticipated d/c is to: SNF, authorization pending              Anticipated d/c date is: in am.  Patient currently is not medically stable to d/c.Requiring ivf for correction of low Na level and also need insurance authorization pending            LOS: 2 days   Time spent:35 min with >50% on coc    Nolberto Hanlon, MD [facility] Pager [phone]  If 7PM-7AM, please contact night-coverage [email or web address] Password [facility] 11/12/2019, 8:24 AM Patient ID: [name], female   DOB: [birth year], 79 y.o.   MRN: [id number]

## 2019-11-12 NOTE — Evaluation (Signed)
Physical Therapy Evaluation Patient Details Name: [name] Woods MRN: [id number] DOB: [birth year] Today's Date: 11/12/2019   History of Present Illness  [name] Woods  is a 79 y.o. female with a known history of hypertension, spinal stenosis status post back surgery, history of Guillain-Barr syndrome in 1978, comes to the emergency room after she had a mechanical fall when she turned around to quick lost balance fell. S/P right hip pinning. WBAT.  Clinical Impression  Patient is received in bed, alert, talkative, reports minimal pain. Patient performed bed mobility with mod independence, transfers with min assist. Slightly unsteady with initial standing balance. Ambulated 10 feet in room with RW and min guard. She will continue to benefit from skilled PT while here to improve strength and functional independence.      Follow Up Recommendations SNF    Equipment Recommendations  None recommended by PT    Recommendations for Other Services       Precautions / Restrictions Precautions Precautions: Fall Restrictions Weight Bearing Restrictions: Yes RLE Weight Bearing: Weight bearing as tolerated      Mobility  Bed Mobility Overal bed mobility: Modified Independent             General bed mobility comments: increased time and effort needed, use of rail, but no physical assist provided.  Transfers Overall transfer level: Needs assistance Equipment used: Rolling walker (2 wheeled) Transfers: Sit to/from Stand Sit to Stand: Min guard         General transfer comment: intial unsteadiness with standing balance  Ambulation/Gait Ambulation/Gait assistance: Min guard Gait Distance (Feet): 10 Feet Assistive device: Rolling walker (2 wheeled) Gait Pattern/deviations: Step-to pattern;Decreased stride length Gait velocity: decr   General Gait Details: slight unsteadiness with mobility. Minimal pain with mobility  Stairs            Wheelchair Mobility     Modified Rankin (Stroke Patients Only)       Balance Overall balance assessment: Needs assistance Sitting-balance support: Feet supported;Single extremity supported Sitting balance-Leahy Scale: Good     Standing balance support: Bilateral upper extremity supported;During functional activity Standing balance-Leahy Scale: Poor Standing balance comment: lob after initial standing, slightl unsteadiness with dynamic standing activities. Reliant on RW and assistance for safety                             Pertinent Vitals/Pain Pain Assessment: 0-10 Pain Score: 4  Pain Location: R hip with mobility Pain Descriptors / Indicators: Discomfort Pain Intervention(s): Monitored during session;Limited activity within patient's tolerance;Repositioned    Home Living Family/patient expects to be discharged to:: Private residence Living Arrangements: Alone Available Help at Discharge: Family;Available PRN/intermittently Type of Home: House Home Access: Level entry     Home Layout: One level Home Equipment: Walker - 4 wheels;Cane - single point      Prior Function Level of Independence: Independent with assistive device(s)         Comments: reports she was using cane, has rollator at home     Hand Dominance        Extremity/Trunk Assessment   Upper Extremity Assessment Upper Extremity Assessment: Overall WFL for tasks assessed    Lower Extremity Assessment Lower Extremity Assessment: Overall WFL for tasks assessed    Cervical / Trunk Assessment Cervical / Trunk Assessment: Normal  Communication   Communication: No difficulties  Cognition Arousal/Alertness: Awake/alert Behavior During Therapy: WFL for tasks assessed/performed Overall Cognitive Status: Within Functional Limits for tasks  assessed                                 General Comments: patient very pleasant, sharp, conversive.      General Comments      Exercises      Assessment/Plan    PT Assessment Patient needs continued PT services  PT Problem List Decreased strength;Decreased mobility;Decreased safety awareness;Decreased activity tolerance;Decreased balance;Pain       PT Treatment Interventions DME instruction;Therapeutic activities;Gait training;Therapeutic exercise;Patient/family education;Stair training;Balance training;Functional mobility training    PT Goals (Current goals can be found in the Care Plan section)  Acute Rehab PT Goals Patient Stated Goal: to go to rehab at [facility] prior to returning home alone PT Goal Formulation: With patient Time For Goal Achievement: 11/19/19 Potential to Achieve Goals: Good    Frequency BID   Barriers to discharge Decreased caregiver support      Co-evaluation               AM-PAC PT "6 Clicks" Mobility  Outcome Measure Help needed turning from your back to your side while in a flat bed without using bedrails?: None Help needed moving from lying on your back to sitting on the side of a flat bed without using bedrails?: A Little Help needed moving to and from a bed to a chair (including a wheelchair)?: A Little Help needed standing up from a chair using your arms (e.g., wheelchair or bedside chair)?: A Little Help needed to walk in hospital room?: A Little Help needed climbing 3-5 steps with a railing? : A Lot 6 Click Score: 18    End of Session Equipment Utilized During Treatment: Gait belt Activity Tolerance: Patient tolerated treatment well Patient left: in chair;with chair alarm set;with family/visitor present Nurse Communication: Mobility status PT Visit Diagnosis: Unsteadiness on feet (R26.81);Muscle weakness (generalized) (M62.81);Difficulty in walking, not elsewhere classified (R26.2);Pain Pain - Right/Left: Right Pain - part of body: Hip    Time: 4268-3419 PT Time Calculation (min) (ACUTE ONLY): 25 min   Charges:   PT Evaluation $PT Eval Moderate Complexity: 1  Mod PT Treatments $Gait Training: 8-22 mins        Carless Slatten, PT, GCS 11/12/19,9:46 AM

## 2019-11-12 NOTE — Progress Notes (Signed)
Patient refused torsemide and hydralazine

## 2019-11-12 NOTE — Anesthesia Postprocedure Evaluation (Signed)
Anesthesia Post Note  Patient: [name] Woods  Procedure(s) Performed: CANNULATED HIP PINNING (Right Hip)  Patient location during evaluation: PACU Anesthesia Type: General Level of consciousness: awake and alert Pain management: pain level controlled Vital Signs Assessment: post-procedure vital signs reviewed and stable Respiratory status: spontaneous breathing, nonlabored ventilation and respiratory function stable Cardiovascular status: blood pressure returned to baseline and stable Postop Assessment: no apparent nausea or vomiting Anesthetic complications: no   No complications documented.    Last Vitals:  Vitals:   11/12/19 0415 11/12/19 0815  BP: 128/62 140/67  Pulse: 83 80  Resp: 18 17  Temp: 36.8 C 36.6 C  SpO2: 98% 97%    Last Pain:  Vitals:   11/12/19 0800  TempSrc:   PainSc: [place]

## 2019-11-12 NOTE — NC FL2 (Signed)
[place] LEVEL OF CARE SCREENING TOOL     IDENTIFICATION  Patient Name: [name] Woods Birthdate: May 28, 1940 Sex: female Admission Date (Current Location): 11/10/2019  [place] and [place] Number:  Engineering geologist and Address:  [facility], [address], [place], [place] [address]      Provider Number: [id number]  Attending Physician Name and Address:  Nolberto Hanlon, MD  Relative Name and Phone Number:  Leota Jacobsen [phone]    Current Level of Care: Hospital Recommended Level of Care: [place] Prior Approval Number:    Date Approved/Denied:   PASRR Number: [id number]  Discharge Plan: SNF    Current Diagnoses: Patient Active Problem List   Diagnosis Date Noted  . Hip fracture ([place]) 11/10/2019  . Closed fracture of neck of right femur ([place])   . Acute hip pain, right   . Generalized anxiety disorder   . Labile hypertension 09/16/2018  . Atypical chest pain 09/16/2018  . Chills 07/11/2017  . Essential hypertension 08/03/2016  . SOB (shortness of breath) 08/03/2016  . Anxiety 08/03/2016  . Leg edema 08/03/2016    Orientation RESPIRATION BLADDER Height & Weight     Self, Time, Situation, Place  Normal Continent Weight: 63.5 kg Height:  5\' 3"  (160 cm)  BEHAVIORAL SYMPTOMS/MOOD NEUROLOGICAL BOWEL NUTRITION STATUS      Continent Diet (regular)  AMBULATORY STATUS COMMUNICATION OF NEEDS Skin   Extensive Assist Verbally Surgical wounds                       Personal Care Assistance Level of Assistance  Bathing, Dressing Bathing Assistance: Limited assistance   Dressing Assistance: Limited assistance     Functional Limitations Info             SPECIAL CARE FACTORS FREQUENCY  PT (By licensed PT)     PT Frequency: 5 times per week              Contractures Contractures Info: Not present    Additional Factors Info  Code Status, Allergies Code Status Info: full  code Allergies Info: Azithromycin, Amlodipine           Current Medications (11/12/2019):  This is the current hospital active medication list Current Facility-Administered Medications  Medication Dose Route Frequency Provider Last Rate Last Admin  . 0.9 %  sodium chloride infusion   Intravenous Continuous Nolberto Hanlon, MD 75 mL/hr at 11/12/19 0850 New Bag at 11/12/19 0850  . acetaminophen (TYLENOL) tablet 650 mg  650 mg Oral Q6H PRN Hessie Knows, MD   650 mg at 11/11/19 0359  . alum & mag hydroxide-simeth (MAALOX/MYLANTA) 200-200-20 MG/5ML suspension 30 mL  30 mL Oral Q4H PRN Hessie Knows, MD      . bisacodyl (DULCOLAX) suppository 10 mg  10 mg Rectal Daily PRN Hessie Knows, MD      . bisacodyl (DULCOLAX) suppository 10 mg  10 mg Rectal Daily PRN Hessie Knows, MD      . carvedilol (COREG) tablet 12.5 mg  12.5 mg Oral q morning - 10a Hessie Knows, MD   12.5 mg at 11/12/19 1013  . carvedilol (COREG) tablet 25 mg  25 mg Oral Q1500 Hessie Knows, MD   25 mg at 11/10/19 2202  . ceFAZolin (ANCEF) IVPB 1 g/50 mL premix  1 g Intravenous Q6H Hessie Knows, MD 100 mL/hr at 11/12/19 0536 1 g at 11/12/19 0536  . docusate sodium (COLACE) capsule 100 mg  100  mg Oral BID Hessie Knows, MD   100 mg at 11/12/19 0854  . enoxaparin (LOVENOX) injection 40 mg  40 mg Subcutaneous Q24H Hessie Knows, MD   40 mg at 11/12/19 0757  . escitalopram (LEXAPRO) tablet 10 mg  10 mg Oral Daily Hessie Knows, MD   10 mg at 11/12/19 0854  . estradiol (ESTRACE) tablet 1 mg  1 mg Oral Daily Hessie Knows, MD   1 mg at 11/12/19 0855  . feeding supplement (ENSURE ENLIVE) (ENSURE ENLIVE) liquid 237 mL  237 mL Oral BID BM Nolberto Hanlon, MD      . hydrALAZINE (APRESOLINE) tablet 50 mg  50 mg Oral BID Hessie Knows, MD   50 mg at 11/11/19 2045  . losartan (COZAAR) tablet 100 mg  100 mg Oral Daily Hessie Knows, MD   100 mg at 11/12/19 0856  . magnesium citrate solution 1 Bottle  1 Bottle Oral Once PRN Hessie Knows, MD      .  magnesium hydroxide (MILK OF MAGNESIA) suspension 30 mL  30 mL Oral Daily PRN Hessie Knows, MD      . menthol-cetylpyridinium (CEPACOL) lozenge 3 mg  1 lozenge Oral PRN Hessie Knows, MD       Or  . phenol (CHLORASEPTIC) mouth spray 1 spray  1 spray Mouth/Throat PRN Hessie Knows, MD      . metoCLOPramide (REGLAN) tablet 5-10 mg  5-10 mg Oral Q8H PRN Hessie Knows, MD       Or  . metoCLOPramide (REGLAN) injection 5-10 mg  5-10 mg Intravenous Q8H PRN Hessie Knows, MD      . nitroGLYCERIN (NITROSTAT) SL tablet 0.4 mg  0.4 mg Sublingual Q5 min PRN Hessie Knows, MD      . ondansetron [facility]) tablet 4 mg  4 mg Oral Q6H PRN Hessie Knows, MD       Or  . ondansetron [facility]) injection 4 mg  4 mg Intravenous Q6H PRN Hessie Knows, MD      . pantoprazole (PROTONIX) EC tablet 40 mg  40 mg Oral Daily Hessie Knows, MD   40 mg at 11/12/19 0854  . senna (SENOKOT) tablet 8.6 mg  1 tablet Oral BID Hessie Knows, MD   8.6 mg at 11/12/19 0856  . torsemide (DEMADEX) tablet 5 mg  5 mg Oral Daily Hessie Knows, MD      . traMADol [facility]) tablet 50 mg  50 mg Oral Q6H PRN Hessie Knows, MD   50 mg at 11/12/19 3888     Discharge Medications: Please see discharge summary for a list of discharge medications.  Relevant Imaging Results:  Relevant Lab Results:   Additional Information SS# [id number]  Su Hilt, RN

## 2019-11-12 NOTE — Progress Notes (Deleted)
PROGRESS NOTE    Dechelle Attaway  ZWC:[id number] DOB: [birth year] DOA: 11/10/2019 PCP: Kirk Ruths, MD    Brief Narrative:  [name] 79 y.o.femalewith a known history of hypertension, spinal stenosis status post back surgery, history of Guillain-Barr syndrome in 1978, comes to the emergency room after she had a mechanical fall when she turned around to quick lost balance fell. Patient went in her apartment.  She had hip pain.Admitted for acute right femoral neck fracture, orthopedics following    Consultants:   Orthopedics  Procedures:   Antimicrobials:   Ceftezole   Subjective: Has no complaints this am. PT at bedside stated she did well.  Patient eating and sister at bedside.  Objective: Vitals:   11/12/19 0415 11/12/19 0815 11/12/19 0932 11/12/19 1148  BP: 128/62 140/67  (!) 99/41  Pulse: 83 80  61  Resp: 18 17  18   Temp: 98.3 F (36.8 C) 97.8 F (36.6 C)  98.4 F (36.9 C)  TempSrc: Oral   Oral  SpO2: 98% 97% 98% 95%  Weight:      Height:        Intake/Output Summary (Last 24 hours) at 11/12/2019 1459 Last data filed at 11/12/2019 1355 Gross per 24 hour  Intake 2330.34 ml  Output --  Net 2330.34 ml   Filed Weights   11/10/19 1412  Weight: 63.5 kg    Examination:  General exam: Appears calm and comfortable, eating breakfast NAD Respiratory system: Clear to auscultation. Respiratory effort normal. Cardiovascular system: S1 & S2 heard, RRR. No JVD, murmurs, rubs, gallops or clicks.  Gastrointestinal system: Abdomen is nondistended, soft and nontender.Normal bowel sounds heard. Central nervous system: Alert and oriented X3 grossly intact Extremities: No edema Skin: Warm dry  Psychiatry: Judgement and insight appear normal. Mood & affect appropriate.     Data Reviewed: I have personally reviewed following labs and imaging studies  CBC: Recent Labs  Lab 11/10/19 1635 11/11/19 0951 11/12/19 0549  WBC 18.3* 9.9 11.0*   NEUTROABS 15.3*  --   --   HGB 14.1 12.0 11.7*  HCT 39.5 33.1* 31.8*  MCV 87.2 85.8 86.6  PLT 291 221 235   Basic Metabolic Panel: Recent Labs  Lab 11/10/19 1635 11/11/19 0944 11/11/19 1420 11/12/19 0549  NA 124* 126*  --  127*  K 4.0  --   --  4.3  CL 88*  --   --  96*  CO2 24  --   --  22  GLUCOSE 109*  --   --  118*  BUN 7*  --   --  10  CREATININE 0.75  --  0.74 0.69  CALCIUM 9.3  --   --  8.6*   GFR: Estimated Creatinine Clearance: 52 mL/min (by C-G formula based on SCr of 0.69 mg/dL). Liver Function Tests: Recent Labs  Lab 11/10/19 1635  AST 24  ALT 17  ALKPHOS 48  BILITOT 1.3*  PROT 7.0  ALBUMIN 4.3   No results for input(s): LIPASE, AMYLASE in the last 168 hours. No results for input(s): AMMONIA in the last 168 hours. Coagulation Profile: No results for input(s): INR, PROTIME in the last 168 hours. Cardiac Enzymes: No results for input(s): CKTOTAL, CKMB, CKMBINDEX, TROPONINI in the last 168 hours. BNP (last 3 results) No results for input(s): PROBNP in the last 8760 hours. HbA1C: No results for input(s): HGBA1C in the last 72 hours. CBG: No results for input(s): GLUCAP in the last 168 hours. Lipid Profile: No results for  input(s): CHOL, HDL, LDLCALC, TRIG, CHOLHDL, LDLDIRECT in the last 72 hours. Thyroid Function Tests: No results for input(s): TSH, T4TOTAL, FREET4, T3FREE, THYROIDAB in the last 72 hours. Anemia Panel: No results for input(s): VITAMINB12, FOLATE, FERRITIN, TIBC, IRON, RETICCTPCT in the last 72 hours. Sepsis Labs: No results for input(s): PROCALCITON, LATICACIDVEN in the last 168 hours.  Recent Results (from the past 240 hour(s))  SARS Coronavirus 2 by RT PCR (hospital order, performed in [facility] hospital lab) Nasopharyngeal Nasopharyngeal Swab     Status: None   Collection Time: 11/10/19  6:00 PM   Specimen: Nasopharyngeal Swab  Result Value Ref Range Status   SARS Coronavirus 2 NEGATIVE NEGATIVE Final    Comment:  (NOTE) SARS-CoV-2 target nucleic acids are NOT DETECTED.  The SARS-CoV-2 RNA is generally detectable in upper and lower respiratory specimens during the acute phase of infection. The lowest concentration of SARS-CoV-2 viral copies this assay can detect is 250 copies / mL. A negative result does not preclude SARS-CoV-2 infection and should not be used as the sole basis for treatment or other patient management decisions.  A negative result may occur with improper specimen collection / handling, submission of specimen other than nasopharyngeal swab, presence of viral mutation(s) within the areas targeted by this assay, and inadequate number of viral copies (<250 copies / mL). A negative result must be combined with clinical observations, patient history, and epidemiological information.  Fact Sheet for Patients:   StrictlyIdeas.no  Fact Sheet for Healthcare Providers: BankingDealers.co.za  This test is not yet approved or  cleared by the [place] FDA and has been authorized for detection and/or diagnosis of SARS-CoV-2 by FDA under an Emergency Use Authorization (EUA).  This EUA will remain in effect (meaning this test can be used) for the duration of the COVID-19 declaration under Section 564(b)(1) of the Act, 21 U.S.C. section 360bbb-3(b)(1), unless the authorization is terminated or revoked sooner.  Performed at [facility], [address] [place], [place] [address]   Surgical pcr screen     Status: None   Collection Time: 11/11/19  1:36 AM   Specimen: Nasal Mucosa; Nasal Swab  Result Value Ref Range Status   MRSA, PCR NEGATIVE NEGATIVE Final   Staphylococcus aureus NEGATIVE NEGATIVE Final    Comment: (NOTE) The Xpert SA Assay (FDA approved for NASAL specimens in patients 42 years of age and older), is one component of a comprehensive surveillance program. It is not intended to diagnose infection nor to guide or  monitor treatment. Performed at [facility], [address], [place], [place] [address]          Radiology Studies: DG Chest 1 View  Result Date: 11/10/2019 CLINICAL DATA:  Preoperative evaluation. EXAM: CHEST  1 VIEW COMPARISON:  None. FINDINGS: Mild diffuse chronic appearing increased lung markings are seen. There is no evidence of acute infiltrate, pleural effusion or pneumothorax. The heart size and mediastinal contours are within normal limits. Degenerative changes seen throughout the thoracic spine. IMPRESSION: No active disease. Electronically Signed   By: Virgina Norfolk M.D.   On: 11/10/2019 16:43   DG Elbow Complete Right  Result Date: 11/10/2019 CLINICAL DATA:  Pain post fall EXAM: RIGHT ELBOW - COMPLETE 3+ VIEW COMPARISON:  None. FINDINGS: There is no evidence of fracture, dislocation, or joint effusion. There is no evidence of arthropathy or other focal bone abnormality. Soft tissues are unremarkable. IMPRESSION: Negative. Electronically Signed   By: Donavan Foil M.D.   On: 11/10/2019  16:07   DG Ankle Complete Right  Result Date: 11/10/2019 CLINICAL DATA:  Pain post fall EXAM: RIGHT ANKLE - COMPLETE 3+ VIEW COMPARISON:  None. FINDINGS: No fracture or malalignment. Small plantar calcaneal spur. Ankle mortise appears symmetric. IMPRESSION: No acute osseous abnormality. Electronically Signed   By: Donavan Foil M.D.   On: 11/10/2019 16:05   DG HIP OPERATIVE UNILAT WITH PELVIS RIGHT  Result Date: 11/11/2019 CLINICAL DATA:  Intraoperative evaluation. EXAM: OPERATIVE RIGHT HIP (WITH PELVIS IF PERFORMED)  VIEWS TECHNIQUE: Fluoroscopic spot image(s) were submitted for interpretation post-operatively. COMPARISON:  None. FINDINGS: Acute fracture deformity is seen involving the dorsal aspect of the neck of the proximal right femur. There is no evidence of dislocation. Mild to moderate severity degenerative changes are noted within the left hip. Soft tissue structures are  unremarkable. IMPRESSION: Acute fracture of the proximal right femur. Electronically Signed   By: Virgina Norfolk M.D.   On: 11/11/2019 15:25   DG HIP UNILAT WITH PELVIS 2-3 VIEWS RIGHT  Result Date: 11/10/2019 CLINICAL DATA:  Fall EXAM: DG HIP (WITH OR WITHOUT PELVIS) 2-3V RIGHT COMPARISON:  None. FINDINGS: Decreased osseous mineralization. There is a fracture through the right femoral neck with impaction and mild displacement. Joint spaces are preserved. No dislocation. IMPRESSION: Acute impacted and mildly displaced right subcapital femoral neck fracture. Electronically Signed   By: Macy Mis M.D.   On: 11/10/2019 16:05        Scheduled Meds:  [START ON 11/13/2019] carvedilol  3.125 mg Oral q morning - 10a   docusate sodium  100 mg Oral BID   enoxaparin (LOVENOX) injection  40 mg Subcutaneous Q24H   escitalopram  10 mg Oral Daily   estradiol  1 mg Oral Daily   feeding supplement (ENSURE ENLIVE)  237 mL Oral BID BM   [START ON 11/13/2019] losartan  25 mg Oral Daily   pantoprazole  40 mg Oral Daily   senna  1 tablet Oral BID   Continuous Infusions:  sodium chloride 75 mL/hr at 11/12/19 0850    Assessment & Plan:   Active Problems:   Hip fracture (HCC)   1.s/p mechanical fall with  right hip fracture- Status post right hip pinning on 7/7 by Dr. Rudene Christians -patient reports she does not tolerate narcotics well. Will do Tylenol and Ultram for pain -as needed Toradol PT-SNF.     2.Essential Hypertension- on lower side post surgery -continue Coreg but will decrease dose to 3/125mg  with holding parameters Decrease losartan 25mg  daily  Currently overall controlled Holding torsemide for decreased Na.   3.Generalized anxiety disorder -patient was started on some anxiety meds per Dr. Lenny Pastel medication reconciliation to be done and resume it  4. History of spinal stenosis with surgery in the past  5.Hyponatremia- etiology unclear. Possibly due to  volume depletion. Improving with ivf. Continue to monitor without overcorrecting too fast. Hold torsemide  DVT prophylaxis: lovenox Code Status: Full Family Communication: None at bedside Disposition Plan: home with [facility] Status is: Inpatient  Remains inpatient appropriate because:Ongoing diagnostic testing needed not appropriate for outpatient work up   Dispo: The patient is from: Home              Anticipated d/c is to: home with home health              Anticipated d/c date is: in am.               Patient currently is not medically stable to d/c.Requiring ivf  for correction of low Na level and bp on low side needs to be more stable.             LOS: 2 days   Time spent:35 min with >50% on coc    Nolberto Hanlon, MD [facility] Pager [phone]  If 7PM-7AM, please contact night-coverage [email or web address] Password [facility] 11/12/2019, 2:59 PM

## 2019-11-12 NOTE — TOC Initial Note (Addendum)
Transition of Care [facility]) - Initial/Assessment Note    Patient Details  Name: [name] Woods MRN: [id number] Date of Birth: 1940-06-29  Transition [facility]) CM/SW Contact:    Su Hilt, RN Phone Number: 11/12/2019, 10:44 AM  Clinical Narrative:                 Spoke with the patient she is agreeable to go to [facility] for rehab The patient's family will transport to facility at [place], She has had Covid vaccines I spoke with Seth Bake at [facility] and they are going to accept, The PT has seen the patient however the notes are pending, The patient stated that Ortho Dr wants her to stay until tomorrow I called [employer] and started insurance approval process         Patient Goals and CMS Choice        Expected Discharge Plan and Services                                                Prior Living Arrangements/Services                       Activities of Daily Living Home Assistive Devices/Equipment: Cane (specify quad or straight), Walker (specify type) (rollator) ADL Screening (condition at time of admission) Patient's cognitive ability adequate to safely complete daily activities?: Yes Is the patient deaf or have difficulty hearing?: No Does the patient have difficulty seeing, even when wearing glasses/contacts?: No Does the patient have difficulty concentrating, remembering, or making decisions?: No Patient able to express need for assistance with ADLs?: Yes Does the patient have difficulty dressing or bathing?: Yes Independently performs ADLs?: Yes (appropriate for developmental age) Does the patient have difficulty walking or climbing stairs?: Yes Weakness of Legs: Right Weakness of Arms/Hands: None  Permission Sought/Granted                  Emotional Assessment              Admission diagnosis:  Hip fracture ([place]) [S72.009A] Closed fracture of neck of right femur, initial encounter ([place]) [S72.001A] Acute hip pain, right  [M25.551] Patient Active Problem List   Diagnosis Date Noted  . Hip fracture ([place]) 11/10/2019  . Closed fracture of neck of right femur ([place])   . Acute hip pain, right   . Generalized anxiety disorder   . Labile hypertension 09/16/2018  . Atypical chest pain 09/16/2018  . Chills 07/11/2017  . Essential hypertension 08/03/2016  . SOB (shortness of breath) 08/03/2016  . Anxiety 08/03/2016  . Leg edema 08/03/2016   PCP:  Kirk Ruths, MD Pharmacy:   [place], [place] [address], [place] [address] Phone: [phone] Fax: [phone]  [facility] [place], [place] - [place] [address] Phone: [phone] Fax: [phone]     Social Determinants of Health (SDOH) Interventions    Readmission Risk Interventions No flowsheet data found.

## 2019-11-13 DIAGNOSIS — S72001D Fracture of unspecified part of neck of right femur, subsequent encounter for closed fracture with routine healing: Secondary | ICD-10-CM | POA: Diagnosis not present

## 2019-11-13 DIAGNOSIS — Z8739 Personal history of other diseases of the musculoskeletal system and connective tissue: Secondary | ICD-10-CM | POA: Diagnosis not present

## 2019-11-13 DIAGNOSIS — S72001A Fracture of unspecified part of neck of right femur, initial encounter for closed fracture: Secondary | ICD-10-CM | POA: Diagnosis not present

## 2019-11-13 DIAGNOSIS — F39 Unspecified mood [affective] disorder: Secondary | ICD-10-CM | POA: Diagnosis not present

## 2019-11-13 DIAGNOSIS — E871 Hypo-osmolality and hyponatremia: Secondary | ICD-10-CM | POA: Diagnosis not present

## 2019-11-13 DIAGNOSIS — F339 Major depressive disorder, recurrent, unspecified: Secondary | ICD-10-CM | POA: Diagnosis not present

## 2019-11-13 DIAGNOSIS — K219 Gastro-esophageal reflux disease without esophagitis: Secondary | ICD-10-CM | POA: Diagnosis not present

## 2019-11-13 DIAGNOSIS — M48062 Spinal stenosis, lumbar region with neurogenic claudication: Secondary | ICD-10-CM | POA: Diagnosis not present

## 2019-11-13 DIAGNOSIS — W19XXXD Unspecified fall, subsequent encounter: Secondary | ICD-10-CM | POA: Diagnosis not present

## 2019-11-13 DIAGNOSIS — G4709 Other insomnia: Secondary | ICD-10-CM | POA: Diagnosis not present

## 2019-11-13 DIAGNOSIS — M5000 Cervical disc disorder with myelopathy, unspecified cervical region: Secondary | ICD-10-CM | POA: Diagnosis not present

## 2019-11-13 DIAGNOSIS — F411 Generalized anxiety disorder: Secondary | ICD-10-CM | POA: Diagnosis not present

## 2019-11-13 DIAGNOSIS — S72009S Fracture of unspecified part of neck of unspecified femur, sequela: Secondary | ICD-10-CM | POA: Diagnosis not present

## 2019-11-13 DIAGNOSIS — I10 Essential (primary) hypertension: Secondary | ICD-10-CM | POA: Diagnosis not present

## 2019-11-13 LAB — BASIC METABOLIC PANEL
Anion gap: 9 (ref 5–15)
BUN: 12 mg/dL (ref 8–23)
CO2: 25 mmol/L (ref 22–32)
Calcium: 8.7 mg/dL — ABNORMAL LOW (ref 8.9–10.3)
Chloride: 94 mmol/L — ABNORMAL LOW (ref 98–111)
Creatinine, Ser: 0.66 mg/dL (ref 0.44–1.00)
GFR calc Af Amer: >60 mL/min (ref >60)
GFR calc non Af Amer: >60 mL/min (ref >60)
Glucose, Bld: 100 mg/dL — ABNORMAL HIGH (ref 70–99)
Potassium: 4.3 mmol/L (ref 3.5–5.1)
Sodium: 128 mmol/L — ABNORMAL LOW (ref 135–145)

## 2019-11-13 LAB — CBC
HCT: 30.7 % — ABNORMAL LOW (ref 36.0–46.0)
Hemoglobin: 11 g/dL — ABNORMAL LOW (ref 12.0–15.0)
MCH: 31.3 pg (ref 26.0–34.0)
MCHC: 35.8 g/dL (ref 30.0–36.0)
MCV: 87.2 fL (ref 80.0–100.0)
Platelets: 240 10*3/uL (ref 150–400)
RBC: 3.52 MIL/uL — ABNORMAL LOW (ref 3.87–5.11)
RDW: 13.4 % (ref 11.5–15.5)
WBC: 11.5 10*3/uL — ABNORMAL HIGH (ref 4.0–10.5)
nRBC: 0 % (ref 0.0–0.2)

## 2019-11-13 MED ORDER — LOSARTAN POTASSIUM 25 MG PO TABS: 25.0000 mg | ORAL_TABLET | Freq: Every day | ORAL | Status: DC

## 2019-11-13 MED ORDER — ACETAMINOPHEN 325 MG PO TABS: 650.0000 mg | ORAL_TABLET | Freq: Four times a day (QID) | ORAL | Status: DC | PRN

## 2019-11-13 MED ORDER — POLYETHYLENE GLYCOL 3350 17 G PO PACK: 17.0000 g | PACK | Freq: Every day | ORAL | 0 refills | Status: DC

## 2019-11-13 MED ORDER — FLEET ENEMA 7-19 GM/118ML RE ENEM
1.0000 | ENEMA | Freq: Once | RECTAL | Status: AC
  Administered 2019-11-13: 1 via RECTAL

## 2019-11-13 MED ORDER — CARVEDILOL 3.125 MG PO TABS: 3.1250 mg | ORAL_TABLET | Freq: Every morning | ORAL | Status: DC

## 2019-11-13 MED ORDER — POLYETHYLENE GLYCOL 3350 17 G PO PACK: 17.0000 g | PACK | Freq: Every day | ORAL | Status: DC

## 2019-11-13 MED ORDER — CYCLOBENZAPRINE HCL 10 MG PO TABS: 10.0000 mg | ORAL_TABLET | Freq: Two times a day (BID) | ORAL | 1 refills | Status: DC | PRN

## 2019-11-13 MED ORDER — SENNA 8.6 MG PO TABS: 1.0000 | ORAL_TABLET | Freq: Two times a day (BID) | ORAL | 0 refills | Status: DC

## 2019-11-13 MED ORDER — DOCUSATE SODIUM 100 MG PO CAPS: 100.0000 mg | ORAL_CAPSULE | Freq: Two times a day (BID) | ORAL | 0 refills | Status: DC

## 2019-11-13 NOTE — TOC Progression Note (Signed)
Transition of Care [facility]) - Progression Note    Patient Details  Name: [name] Woods MRN: [id number] Date of Birth: [birth year]  Transition of Care [facility]) CM/SW [place], RN Phone Number: 11/13/2019, 12:14 PM  Clinical Narrative:    Patient to DC to [facility] rehab, DC packet on the chart, the bedside nurse to call report, the family is to transport        Expected Discharge Plan and Services           Expected Discharge Date: 11/13/19                                     Social Determinants of Health (SDOH) Interventions    Readmission Risk Interventions No flowsheet data found.

## 2019-11-13 NOTE — Progress Notes (Signed)
Physical Therapy Treatment Patient Details Name: [name] Woods MRN: [id number] DOB: [birth year] Today's Date: 11/13/2019    History of Present Illness [name] Woods  is a 79 y.o. female with a known history of hypertension, spinal stenosis status post back surgery, history of Guillain-Barr syndrome in 1978, comes to the emergency room after she had a mechanical fall when she turned around to quick lost balance fell. S/P right hip pinning. WBAT.    PT Comments    Patient received in [facility]. Reports she is ready to get up, was unable to have BM. Patient reports she is "not good" today. States she did not sleep, her hip is hurting more and she is unsteady today. Declines ambulation in room or in hall. Assisted patient back to bed and she performed bed level exercises with min guard. She will continue to benefit from skilled PT while here to improve strength, safety and functional independence.       Follow Up Recommendations  SNF;Supervision/Assistance - 24 hour     Equipment Recommendations  None recommended by PT    Recommendations for Other Services       Precautions / Restrictions Precautions Precautions: Fall Restrictions Weight Bearing Restrictions: Yes RLE Weight Bearing: Weight bearing as tolerated    Mobility  Bed Mobility Overal bed mobility: Needs Assistance         Sit to supine: Min assist   General bed mobility comments: required assistance to bring LEs back up onto bed  Transfers Overall transfer level: Needs assistance Equipment used: Rolling walker (2 wheeled) Transfers: Sit to/from Stand Sit to Stand: Min guard         General transfer comment: intial unsteadiness with standing balance, cues for hand placement and safety  Ambulation/Gait Ambulation/Gait assistance: Min guard Gait Distance (Feet): 4 Feet Assistive device: Rolling walker (2 wheeled) Gait Pattern/deviations: Step-to pattern;Decreased stride length;Shuffle Gait velocity:  decr   General Gait Details: patient declines further ambulation this visit due to having not slept last night, increased pain in hip and feeling unsteady.   Stairs             Wheelchair Mobility    Modified Rankin (Stroke Patients Only)       Balance Overall balance assessment: Needs assistance Sitting-balance support: Feet supported Sitting balance-Leahy Scale: Good     Standing balance support: Bilateral upper extremity supported;During functional activity Standing balance-Leahy Scale: Fair Standing balance comment: Requires min guard for mobility and RW                            Cognition Arousal/Alertness: Awake/alert Behavior During Therapy: WFL for tasks assessed/performed Overall Cognitive Status: Within Functional Limits for tasks assessed                                        Exercises Total Joint Exercises Ankle Circles/Pumps: AROM;Both;10 reps Quad Sets: AROM;Both;10 reps Gluteal Sets: AROM;Both;10 reps Heel Slides: AAROM;Right;10 reps Hip ABduction/ADduction: AAROM;Right;10 reps Straight Leg Raises: AAROM;Right;10 reps    General Comments        Pertinent Vitals/Pain Pain Assessment: 0-10 Pain Score: 5  Pain Location: R hip with mobility Pain Descriptors / Indicators: Discomfort;Sore Pain Intervention(s): Monitored during session    Home Living                      Prior  Function            PT Goals (current goals can now be found in the care plan section) Acute Rehab PT Goals Patient Stated Goal: to go to rehab at [facility] prior to returning home alone PT Goal Formulation: With patient Time For Goal Achievement: 11/19/19 Potential to Achieve Goals: Good Progress towards PT goals: Progressing toward goals    Frequency    BID      PT Plan Current plan remains appropriate    Co-evaluation              AM-PAC PT "6 Clicks" Mobility   Outcome Measure  Help needed turning from  your back to your side while in a flat bed without using bedrails?: None Help needed moving from lying on your back to sitting on the side of a flat bed without using bedrails?: A Little Help needed moving to and from a bed to a chair (including a wheelchair)?: A Little Help needed standing up from a chair using your arms (e.g., wheelchair or bedside chair)?: A Little Help needed to walk in hospital room?: A Little Help needed climbing 3-5 steps with a railing? : A Lot 6 Click Score: 18    End of Session Equipment Utilized During Treatment: Gait belt Activity Tolerance: Patient limited by pain;Patient limited by fatigue Patient left: in bed;with bed alarm set;with call bell/phone within reach Nurse Communication: Mobility status PT Visit Diagnosis: Unsteadiness on feet (R26.81);Muscle weakness (generalized) (M62.81);Difficulty in walking, not elsewhere classified (R26.2);Pain;History of falling (Z91.81) Pain - Right/Left: Right Pain - part of body: Hip     Time: 0950-1010 PT Time Calculation (min) (ACUTE ONLY): 20 min  Charges:  $Therapeutic Exercise: 8-22 mins                     Catalyna Reilly, PT, GCS 11/13/19,10:21 AM

## 2019-11-13 NOTE — Progress Notes (Signed)
Pt had a BM after the fleet enema was given. DC pocket was given and discussed with the pt and sister. Pt's sister Vaughan Basta will drive the pt back to the facility. Report was given to [place].

## 2019-11-13 NOTE — Discharge Summary (Signed)
[name] Woods DUK:[id number] DOB: 27-Aug-1940 DOA: 11/10/2019  PCP: Kirk Ruths, MD  Admit date: 11/10/2019 Discharge date: 11/13/2019  Admitted From: [facility] Disposition: [facility]  Recommendations for Outpatient Follow-up:  1. Follow up with PCP in 1 week 2. Please obtain BMP/CBC in one week 3. Follow-up with orthopedics Dr. Rudene Christians in 10 days 4. Needs Lovenox subcu for 1 week     Discharge Condition:Stable CODE STATUS: Full Diet recommendation: Heart Healthy   Brief/Interim Summary: [name] a79 y.o.femalewith a known history of hypertension, spinal stenosis status post back surgery, history of Guillain-Barr syndrome in 1978, comes to the emergency room after she had a mechanical fall when she turned around to quick lost balance fell.  Hip x-ray revealed Acute fracture of the proximal right femur.. So found with hyponatremia on admission with a sodium of 124. Orthopedics was consulted.  1.s/p mechanical fall with  right hip fracture- Status post right hip pinning on 7/7 by Dr. Rudene Christians -patient reports she does not tolerate narcotics well. Will do Tylenol and Ultram for pain Lovenox for 1 week per Dr. Rudene Christians via chat    2.Essential Hypertension Stopped diuretics due to hyponatremia Decreased coreg and losartan dose due to lower bp post surgery Reevaluate bp as outpatient to see if patient needs further adjustment  3.Generalized anxiety disorder Follow-up with PCP for further management  4.History of spinal stenosis with surgery in the past  5.Hyponatremia- etiology unclear. Possibly due to volume depletion. Improving with ivf. Today sodium 128 D/c torsemide Needs level checked periodically, with next one in 2 days   Discharge Diagnoses:  Active Problems:   Hip fracture [facility])    Discharge Instructions   Allergies as of 11/13/2019      Reactions   Azithromycin Hives, Rash   Amlodipine Swelling      Medication List     STOP taking these medications   hydrALAZINE 50 MG tablet Commonly known as: APRESOLINE   torsemide 5 MG tablet Commonly known as: DEMADEX     TAKE these medications   acetaminophen 325 MG tablet Commonly known as: TYLENOL Take 2 tablets (650 mg total) by mouth every 6 (six) hours as needed for moderate pain (headache).   carvedilol 3.125 MG tablet Commonly known as: COREG Take 1 tablet (3.125 mg total) by mouth every morning. Start taking on: November 14, 2019 What changed:   medication strength  how much to take  when to take this  additional instructions   cyclobenzaprine 10 MG tablet Commonly known as: FLEXERIL Take 1 tablet (10 mg total) by mouth 2 (two) times daily as needed for muscle spasms (Can take before bedtime for muscle spasms at night).   docusate sodium 100 MG capsule Commonly known as: COLACE Take 1 capsule (100 mg total) by mouth 2 (two) times daily.   enoxaparin 40 MG/0.4ML injection Commonly known as: LOVENOX Inject 0.4 mLs (40 mg total) into the skin daily.   escitalopram 10 MG tablet Commonly known as: LEXAPRO Take 10 mg by mouth daily.   estradiol 1 MG tablet Commonly known as: ESTRACE Take 1 mg by mouth daily.   losartan 25 MG tablet Commonly known as: COZAAR Take 1 tablet (25 mg total) by mouth daily. Start taking on: November 14, 2019 What changed:   medication strength  how much to take   nitroGLYCERIN 0.4 MG SL tablet Commonly known as: NITROSTAT Place 1 tablet (0.4 mg total) under the tongue every 5 (five) minutes as needed for chest pain.  omeprazole 40 MG capsule Commonly known as: PRILOSEC Take 40 mg by mouth daily.   polyethylene glycol 17 g packet Commonly known as: MIRALAX / GLYCOLAX Take 17 g by mouth daily.   senna 8.6 MG Tabs tablet Commonly known as: SENOKOT Take 1 tablet (8.6 mg total) by mouth 2 (two) times daily.   traMADol 50 MG tablet Commonly known as: ULTRAM Take 1-2 tablets (50-100 mg total) by mouth  every 6 (six) hours as needed for moderate pain or severe pain.   zaleplon 5 MG capsule Commonly known as: SONATA Take 5 mg by mouth at bedtime as needed.       Contact information for follow-up providers    Hessie Knows, MD Follow up in 10 day(s).   Specialty: Orthopedic Surgery Contact information: [place] [address] [phone]        Kirk Ruths, MD Follow up in 5 week(s).   Specialty: Internal Medicine Why: needs labs Contact information: [place] [address] [phone]            Contact information for after-discharge care    Destination    [facility] .   Service: Skilled Nursing Contact information: [place] [address] [phone]                 Allergies  Allergen Reactions  . Azithromycin Hives and Rash  . Amlodipine Swelling    Consultations:  Orthopedics   Procedures/Studies: DG Chest 1 View  Result Date: 11/10/2019 CLINICAL DATA:  Preoperative evaluation. EXAM: CHEST  1 VIEW COMPARISON:  None. FINDINGS: Mild diffuse chronic appearing increased lung markings are seen. There is no evidence of acute infiltrate, pleural effusion or pneumothorax. The heart size and mediastinal contours are within normal limits. Degenerative changes seen throughout the thoracic spine. IMPRESSION: No active disease. Electronically Signed   By: Virgina Norfolk M.D.   On: 11/10/2019 16:43   DG Elbow Complete Right  Result Date: 11/10/2019 CLINICAL DATA:  Pain post fall EXAM: RIGHT ELBOW - COMPLETE 3+ VIEW COMPARISON:  None. FINDINGS: There is no evidence of fracture, dislocation, or joint effusion. There is no evidence of arthropathy or other focal bone abnormality. Soft tissues are unremarkable. IMPRESSION: Negative. Electronically Signed   By: Donavan Foil M.D.   On: 11/10/2019 16:07   DG Ankle  Complete Right  Result Date: 11/10/2019 CLINICAL DATA:  Pain post fall EXAM: RIGHT ANKLE - COMPLETE 3+ VIEW COMPARISON:  None. FINDINGS: No fracture or malalignment. Small plantar calcaneal spur. Ankle mortise appears symmetric. IMPRESSION: No acute osseous abnormality. Electronically Signed   By: Donavan Foil M.D.   On: 11/10/2019 16:05   DG HIP OPERATIVE UNILAT WITH PELVIS RIGHT  Result Date: 11/11/2019 CLINICAL DATA:  Intraoperative evaluation. EXAM: OPERATIVE RIGHT HIP (WITH PELVIS IF PERFORMED)  VIEWS TECHNIQUE: Fluoroscopic spot image(s) were submitted for interpretation post-operatively. COMPARISON:  None. FINDINGS: Acute fracture deformity is seen involving the dorsal aspect of the neck of the proximal right femur. There is no evidence of dislocation. Mild to moderate severity degenerative changes are noted within the left hip. Soft tissue structures are unremarkable. IMPRESSION: Acute fracture of the proximal right femur. Electronically Signed   By: Virgina Norfolk M.D.   On: 11/11/2019 15:25   DG HIP UNILAT WITH PELVIS 2-3 VIEWS RIGHT  Result Date: 11/10/2019 CLINICAL DATA:  Fall EXAM: DG HIP (WITH OR WITHOUT  PELVIS) 2-3V RIGHT COMPARISON:  None. FINDINGS: Decreased osseous mineralization. There is a fracture through the right femoral neck with impaction and mild displacement. Joint spaces are preserved. No dislocation. IMPRESSION: Acute impacted and mildly displaced right subcapital femoral neck fracture. Electronically Signed   By: Macy Mis M.D.   On: 11/10/2019 16:05       Subjective: Feels tired because she did not sleep last night.  She feels she will be better when she goes to rehab.  No other complaints  Discharge Exam: Vitals:   11/12/19 2357 11/13/19 0758  BP: 139/64 (!) 146/71  Pulse: 70 74  Resp: 18 17  Temp: 98.4 F (36.9 C) 98.4 F (36.9 C)  SpO2: 94% 93%   Vitals:   11/12/19 1148 11/12/19 1558 11/12/19 2357 11/13/19 0758  BP: (!) 99/41 (!) 132/55 139/64  (!) 146/71  Pulse: 61 66 70 74  Resp: 18 18 18 17   Temp: 98.4 F (36.9 C) 97.9 F (36.6 C) 98.4 F (36.9 C) 98.4 F (36.9 C)  TempSrc: Oral Oral Oral Oral  SpO2: 95% 93% 94% 93%  Weight:      Height:        General: Pt is alert, awake, not in acute distress Cardiovascular: RRR, S1/S2 +, no rubs, no gallops Respiratory: CTA bilaterally, no wheezing, no rhonchi Abdominal: Soft, NT, ND, bowel sounds + Extremities: no edema, no cyanosis    The results of significant diagnostics from this hospitalization (including imaging, microbiology, ancillary and laboratory) are listed below for reference.     Microbiology: Recent Results (from the past 240 hour(s))  SARS Coronavirus 2 by RT PCR (hospital order, performed in [facility] hospital lab) Nasopharyngeal Nasopharyngeal Swab     Status: None   Collection Time: 11/10/19  6:00 PM   Specimen: Nasopharyngeal Swab  Result Value Ref Range Status   SARS Coronavirus 2 NEGATIVE NEGATIVE Final    Comment: (NOTE) SARS-CoV-2 target nucleic acids are NOT DETECTED.  The SARS-CoV-2 RNA is generally detectable in upper and lower respiratory specimens during the acute phase of infection. The lowest concentration of SARS-CoV-2 viral copies this assay can detect is 250 copies / mL. A negative result does not preclude SARS-CoV-2 infection and should not be used as the sole basis for treatment or other patient management decisions.  A negative result may occur with improper specimen collection / handling, submission of specimen other than nasopharyngeal swab, presence of viral mutation(s) within the areas targeted by this assay, and inadequate number of viral copies (<250 copies / mL). A negative result must be combined with clinical observations, patient history, and epidemiological information.  Fact Sheet for Patients:   StrictlyIdeas.no  Fact Sheet for Healthcare  Providers: BankingDealers.co.za  This test is not yet approved or  cleared by the [place] FDA and has been authorized for detection and/or diagnosis of SARS-CoV-2 by FDA under an Emergency Use Authorization (EUA).  This EUA will remain in effect (meaning this test can be used) for the duration of the COVID-19 declaration under Section 564(b)(1) of the Act, 21 U.S.C. section 360bbb-3(b)(1), unless the authorization is terminated or revoked sooner.  Performed at [facility], [address], [place], [place] [address]   Surgical pcr screen     Status: None   Collection Time: 11/11/19  1:36 AM   Specimen: Nasal Mucosa; Nasal Swab  Result Value Ref Range Status   MRSA, PCR NEGATIVE NEGATIVE Final   Staphylococcus aureus NEGATIVE NEGATIVE Final    Comment: (  NOTE) The Xpert SA Assay (FDA approved for NASAL specimens in patients 50 years of age and older), is one component of a comprehensive surveillance program. It is not intended to diagnose infection nor to guide or monitor treatment. Performed at [facility], [address] [place], [place] [address]   SARS Coronavirus 2 by RT PCR (hospital order, performed in [facility] hospital lab) Nasopharyngeal Nasopharyngeal Swab     Status: None   Collection Time: 11/12/19  5:24 PM   Specimen: Nasopharyngeal Swab  Result Value Ref Range Status   SARS Coronavirus 2 NEGATIVE NEGATIVE Final    Comment: (NOTE) SARS-CoV-2 target nucleic acids are NOT DETECTED.  The SARS-CoV-2 RNA is generally detectable in upper and lower respiratory specimens during the acute phase of infection. The lowest concentration of SARS-CoV-2 viral copies this assay can detect is 250 copies / mL. A negative result does not preclude SARS-CoV-2 infection and should not be used as the sole basis for treatment or other patient management decisions.  A negative result may occur with improper specimen collection / handling,  submission of specimen other than nasopharyngeal swab, presence of viral mutation(s) within the areas targeted by this assay, and inadequate number of viral copies (<250 copies / mL). A negative result must be combined with clinical observations, patient history, and epidemiological information.  Fact Sheet for Patients:   StrictlyIdeas.no  Fact Sheet for Healthcare Providers: BankingDealers.co.za  This test is not yet approved or  cleared by the [place] FDA and has been authorized for detection and/or diagnosis of SARS-CoV-2 by FDA under an Emergency Use Authorization (EUA).  This EUA will remain in effect (meaning this test can be used) for the duration of the COVID-19 declaration under Section 564(b)(1) of the Act, 21 U.S.C. section 360bbb-3(b)(1), unless the authorization is terminated or revoked sooner.  Performed at [facility], [address] [place], [place] [address]      Labs: BNP (last 3 results) No results for input(s): BNP in the last 8760 hours. Basic Metabolic Panel: Recent Labs  Lab 11/10/19 1635 11/11/19 0944 11/11/19 1420 11/12/19 0549 11/13/19 0629  NA 124* 126*  --  127* 128*  K 4.0  --   --  4.3 4.3  CL 88*  --   --  96* 94*  CO2 24  --   --  22 25  GLUCOSE 109*  --   --  118* 100*  BUN 7*  --   --  10 12  CREATININE 0.75  --  0.74 0.69 0.66  CALCIUM 9.3  --   --  8.6* 8.7*   Liver Function Tests: Recent Labs  Lab 11/10/19 1635  AST 24  ALT 17  ALKPHOS 48  BILITOT 1.3*  PROT 7.0  ALBUMIN 4.3   No results for input(s): LIPASE, AMYLASE in the last 168 hours. No results for input(s): AMMONIA in the last 168 hours. CBC: Recent Labs  Lab 11/10/19 1635 11/11/19 0951 11/12/19 0549 11/13/19 0629  WBC 18.3* 9.9 11.0* 11.5*  NEUTROABS 15.3*  --   --   --   HGB 14.1 12.0 11.7* 11.0*  HCT 39.5 33.1* 31.8* 30.7*  MCV 87.2 85.8 86.6 87.2  PLT 291 221 240 240   Cardiac  Enzymes: No results for input(s): CKTOTAL, CKMB, CKMBINDEX, TROPONINI in the last 168 hours. BNP: Invalid input(s): POCBNP CBG: No results for input(s): GLUCAP in the last 168 hours. D-Dimer No results for input(s): DDIMER in the last 72 hours.  Hgb A1c No results for input(s): HGBA1C in the last 72 hours. Lipid Profile No results for input(s): CHOL, HDL, LDLCALC, TRIG, CHOLHDL, LDLDIRECT in the last 72 hours. Thyroid function studies No results for input(s): TSH, T4TOTAL, T3FREE, THYROIDAB in the last 72 hours.  Invalid input(s): FREET3 Anemia work up No results for input(s): VITAMINB12, FOLATE, FERRITIN, TIBC, IRON, RETICCTPCT in the last 72 hours. Urinalysis    Component Value Date/Time   COLORURINE YELLOW (A) 11/10/2019 1922   APPEARANCEUR HAZY (A) 11/10/2019 1922   LABSPEC 1.005 11/10/2019 [place] 6.0 11/10/2019 [place] NEGATIVE 11/10/2019 [place] NEGATIVE 11/10/2019 [place] NEGATIVE 11/10/2019 1922   KETONESUR 20 (A) 11/10/2019 [place] NEGATIVE 11/10/2019 1922   NITRITE NEGATIVE 11/10/2019 1922   LEUKOCYTESUR NEGATIVE 11/10/2019 1922   Sepsis Labs Invalid input(s): PROCALCITONIN,  WBC,  LACTICIDVEN Microbiology Recent Results (from the past 240 hour(s))  SARS Coronavirus 2 by RT PCR (hospital order, performed in [facility] hospital lab) Nasopharyngeal Nasopharyngeal Swab     Status: None   Collection Time: 11/10/19  6:00 PM   Specimen: Nasopharyngeal Swab  Result Value Ref Range Status   SARS Coronavirus 2 NEGATIVE NEGATIVE Final    Comment: (NOTE) SARS-CoV-2 target nucleic acids are NOT DETECTED.  The SARS-CoV-2 RNA is generally detectable in upper and lower respiratory specimens during the acute phase of infection. The lowest concentration of SARS-CoV-2 viral copies this assay can detect is 250 copies / mL. A negative result does not preclude SARS-CoV-2 infection and should not be used as the sole basis for treatment or  other patient management decisions.  A negative result may occur with improper specimen collection / handling, submission of specimen other than nasopharyngeal swab, presence of viral mutation(s) within the areas targeted by this assay, and inadequate number of viral copies (<250 copies / mL). A negative result must be combined with clinical observations, patient history, and epidemiological information.  Fact Sheet for Patients:   StrictlyIdeas.no  Fact Sheet for Healthcare Providers: BankingDealers.co.za  This test is not yet approved or  cleared by the [place] FDA and has been authorized for detection and/or diagnosis of SARS-CoV-2 by FDA under an Emergency Use Authorization (EUA).  This EUA will remain in effect (meaning this test can be used) for the duration of the COVID-19 declaration under Section 564(b)(1) of the Act, 21 U.S.C. section 360bbb-3(b)(1), unless the authorization is terminated or revoked sooner.  Performed at [facility], [address] [place], [place] [address]   Surgical pcr screen     Status: None   Collection Time: 11/11/19  1:36 AM   Specimen: Nasal Mucosa; Nasal Swab  Result Value Ref Range Status   MRSA, PCR NEGATIVE NEGATIVE Final   Staphylococcus aureus NEGATIVE NEGATIVE Final    Comment: (NOTE) The Xpert SA Assay (FDA approved for NASAL specimens in patients 59 years of age and older), is one component of a comprehensive surveillance program. It is not intended to diagnose infection nor to guide or monitor treatment. Performed at [facility], [address] [place],  [address]   SARS Coronavirus 2 by RT PCR (hospital order, performed in [facility] hospital lab) Nasopharyngeal Nasopharyngeal Swab     Status: None   Collection Time: 11/12/19  5:24 PM   Specimen: Nasopharyngeal Swab  Result Value Ref Range Status   SARS Coronavirus 2 NEGATIVE NEGATIVE Final     Comment: (NOTE) SARS-CoV-2 target nucleic  acids are NOT DETECTED.  The SARS-CoV-2 RNA is generally detectable in upper and lower respiratory specimens during the acute phase of infection. The lowest concentration of SARS-CoV-2 viral copies this assay can detect is 250 copies / mL. A negative result does not preclude SARS-CoV-2 infection and should not be used as the sole basis for treatment or other patient management decisions.  A negative result may occur with improper specimen collection / handling, submission of specimen other than nasopharyngeal swab, presence of viral mutation(s) within the areas targeted by this assay, and inadequate number of viral copies (<250 copies / mL). A negative result must be combined with clinical observations, patient history, and epidemiological information.  Fact Sheet for Patients:   StrictlyIdeas.no  Fact Sheet for Healthcare Providers: BankingDealers.co.za  This test is not yet approved or  cleared by the [place] FDA and has been authorized for detection and/or diagnosis of SARS-CoV-2 by FDA under an Emergency Use Authorization (EUA).  This EUA will remain in effect (meaning this test can be used) for the duration of the COVID-19 declaration under Section 564(b)(1) of the Act, 21 U.S.C. section 360bbb-3(b)(1), unless the authorization is terminated or revoked sooner.  Performed at [facility], [address], [place], [place] [address]      Time coordinating discharge: Over 30 minutes  SIGNED:   Nolberto Hanlon, MD  [facility] 11/13/2019, 10:36 AM Pager   If 7PM-7AM, please contact night-coverage [email or web address] Password [email or web address]

## 2019-11-17 DIAGNOSIS — M5000 Cervical disc disorder with myelopathy, unspecified cervical region: Secondary | ICD-10-CM | POA: Diagnosis not present

## 2019-11-17 DIAGNOSIS — E871 Hypo-osmolality and hyponatremia: Secondary | ICD-10-CM | POA: Diagnosis not present

## 2019-11-17 DIAGNOSIS — K219 Gastro-esophageal reflux disease without esophagitis: Secondary | ICD-10-CM | POA: Diagnosis not present

## 2019-11-17 DIAGNOSIS — I10 Essential (primary) hypertension: Secondary | ICD-10-CM | POA: Diagnosis not present

## 2019-11-17 DIAGNOSIS — S72001A Fracture of unspecified part of neck of right femur, initial encounter for closed fracture: Secondary | ICD-10-CM

## 2019-11-17 DIAGNOSIS — F39 Unspecified mood [affective] disorder: Secondary | ICD-10-CM | POA: Diagnosis not present

## 2019-11-30 DIAGNOSIS — I10 Essential (primary) hypertension: Secondary | ICD-10-CM | POA: Diagnosis not present

## 2019-12-04 DIAGNOSIS — Z8781 Personal history of (healed) traumatic fracture: Secondary | ICD-10-CM | POA: Diagnosis not present

## 2019-12-04 DIAGNOSIS — M62838 Other muscle spasm: Secondary | ICD-10-CM | POA: Diagnosis not present

## 2019-12-04 DIAGNOSIS — I10 Essential (primary) hypertension: Secondary | ICD-10-CM | POA: Diagnosis not present

## 2019-12-04 DIAGNOSIS — Z09 Encounter for follow-up examination after completed treatment for conditions other than malignant neoplasm: Secondary | ICD-10-CM | POA: Diagnosis not present

## 2019-12-09 DIAGNOSIS — G4709 Other insomnia: Secondary | ICD-10-CM | POA: Diagnosis not present

## 2019-12-09 DIAGNOSIS — M6281 Muscle weakness (generalized): Secondary | ICD-10-CM | POA: Diagnosis not present

## 2019-12-09 DIAGNOSIS — K219 Gastro-esophageal reflux disease without esophagitis: Secondary | ICD-10-CM | POA: Diagnosis not present

## 2019-12-09 DIAGNOSIS — S72001D Fracture of unspecified part of neck of right femur, subsequent encounter for closed fracture with routine healing: Secondary | ICD-10-CM | POA: Diagnosis not present

## 2019-12-09 DIAGNOSIS — Z741 Need for assistance with personal care: Secondary | ICD-10-CM | POA: Diagnosis not present

## 2019-12-09 DIAGNOSIS — R278 Other lack of coordination: Secondary | ICD-10-CM | POA: Diagnosis not present

## 2019-12-09 DIAGNOSIS — F411 Generalized anxiety disorder: Secondary | ICD-10-CM | POA: Diagnosis not present

## 2019-12-09 DIAGNOSIS — E871 Hypo-osmolality and hyponatremia: Secondary | ICD-10-CM | POA: Diagnosis not present

## 2019-12-09 DIAGNOSIS — F339 Major depressive disorder, recurrent, unspecified: Secondary | ICD-10-CM | POA: Diagnosis not present

## 2019-12-09 DIAGNOSIS — W19XXXD Unspecified fall, subsequent encounter: Secondary | ICD-10-CM | POA: Diagnosis not present

## 2019-12-09 DIAGNOSIS — R2689 Other abnormalities of gait and mobility: Secondary | ICD-10-CM | POA: Diagnosis not present

## 2019-12-09 DIAGNOSIS — M48062 Spinal stenosis, lumbar region with neurogenic claudication: Secondary | ICD-10-CM | POA: Diagnosis not present

## 2019-12-10 ENCOUNTER — Telehealth: Payer: Self-pay | Admitting: Cardiovascular Disease

## 2019-12-10 NOTE — Telephone Encounter (Signed)
Pt c/o BP issue: STAT if pt c/o blurred vision, one-sided weakness or slurred speech  1. What are your last 5 BP readings?   188/103 All last week normal   2. Are you having any other symptoms (ex. Dizziness, headache, blurred vision, passed out)?   1 episode last night chest pain  - not now  Restless at night trending issue  S/p hip surgery   3. What is your BP issue?    Patient not sure what to do  meds recently cut back significantly after hospital visit and at rehab  Home now and is not sure if change is causing increase

## 2019-12-10 NOTE — Telephone Encounter (Signed)
  Spoke with patient and upon reviewing her chart found that her BP meds have been changed a couple times. Below is a copied note from her visit (7/30) with her primary care, Venetia Maxon, Rolanda Jay, PA:  During hospital stay 7/6-7/9 for hip pinning, her blood pressure dropped and they reduced her Coreg to 6.25mg  twice a day and losartan 25 mg once a day. She has been doing this and her blood pressure today is controlled.  -Keep a check on blood pressure and call if getting greater than 140/90 readings.    I informed patient to call primary as requested in her AVS to discuss the her BP medication. Patient verbalized understanding and agreed with plan. Will forward to Dr. Rockey Situ and his nurse Olin Hauser for [facility].

## 2019-12-11 MED ORDER — CARVEDILOL 25 MG PO TABS
25.0000 mg | ORAL_TABLET | Freq: Two times a day (BID) | ORAL | 3 refills | Status: DC
Start: 2019-12-11 — End: 2020-06-09

## 2019-12-11 MED ORDER — LOSARTAN POTASSIUM 100 MG PO TABS
100.0000 mg | ORAL_TABLET | Freq: Every day | ORAL | 3 refills | Status: DC
Start: 2019-12-11 — End: 2020-10-20

## 2019-12-11 NOTE — Addendum Note (Signed)
Addended by: Valora Corporal on: 12/11/2019 09:47 AM   Modules accepted: Orders

## 2019-12-11 NOTE — Telephone Encounter (Signed)
Would go back on her prior regimen Post op, while on pain meds, BP can run low Now everything normalized, will likely need prior regimen of: Coreg 12.5 to 25 in the Am, 25 in the PM Losartan 100 daily Previously was taking hydralazine at night or before bed

## 2019-12-11 NOTE — Telephone Encounter (Signed)
Spoke with patient and reviewed provider recommendations. She was agreeable with these changes and has previous bottles of these strengths. Updated prescriptions to reflect changes and she verbalized understanding of all changes. She verbalized understanding of our conversation, agreement with plan, and had no further questions at this time.

## 2019-12-28 DIAGNOSIS — Z8781 Personal history of (healed) traumatic fracture: Secondary | ICD-10-CM | POA: Diagnosis not present

## 2019-12-28 DIAGNOSIS — Z9889 Other specified postprocedural states: Secondary | ICD-10-CM | POA: Diagnosis not present

## 2019-12-30 DIAGNOSIS — D2261 Melanocytic nevi of right upper limb, including shoulder: Secondary | ICD-10-CM | POA: Diagnosis not present

## 2019-12-30 DIAGNOSIS — L570 Actinic keratosis: Secondary | ICD-10-CM | POA: Diagnosis not present

## 2019-12-30 DIAGNOSIS — Z85828 Personal history of other malignant neoplasm of skin: Secondary | ICD-10-CM | POA: Diagnosis not present

## 2019-12-30 DIAGNOSIS — R208 Other disturbances of skin sensation: Secondary | ICD-10-CM | POA: Diagnosis not present

## 2019-12-30 DIAGNOSIS — X32XXXA Exposure to sunlight, initial encounter: Secondary | ICD-10-CM | POA: Diagnosis not present

## 2019-12-30 DIAGNOSIS — L820 Inflamed seborrheic keratosis: Secondary | ICD-10-CM | POA: Diagnosis not present

## 2019-12-30 DIAGNOSIS — D225 Melanocytic nevi of trunk: Secondary | ICD-10-CM | POA: Diagnosis not present

## 2019-12-30 DIAGNOSIS — D2262 Melanocytic nevi of left upper limb, including shoulder: Secondary | ICD-10-CM | POA: Diagnosis not present

## 2019-12-30 DIAGNOSIS — L821 Other seborrheic keratosis: Secondary | ICD-10-CM | POA: Diagnosis not present

## 2019-12-30 DIAGNOSIS — L538 Other specified erythematous conditions: Secondary | ICD-10-CM | POA: Diagnosis not present

## 2020-01-06 DIAGNOSIS — R278 Other lack of coordination: Secondary | ICD-10-CM | POA: Diagnosis not present

## 2020-01-06 DIAGNOSIS — M6281 Muscle weakness (generalized): Secondary | ICD-10-CM | POA: Diagnosis not present

## 2020-01-06 DIAGNOSIS — Z741 Need for assistance with personal care: Secondary | ICD-10-CM | POA: Diagnosis not present

## 2020-01-06 DIAGNOSIS — M48062 Spinal stenosis, lumbar region with neurogenic claudication: Secondary | ICD-10-CM | POA: Diagnosis not present

## 2020-01-06 DIAGNOSIS — G4709 Other insomnia: Secondary | ICD-10-CM | POA: Diagnosis not present

## 2020-01-06 DIAGNOSIS — E871 Hypo-osmolality and hyponatremia: Secondary | ICD-10-CM | POA: Diagnosis not present

## 2020-01-06 DIAGNOSIS — S72001D Fracture of unspecified part of neck of right femur, subsequent encounter for closed fracture with routine healing: Secondary | ICD-10-CM | POA: Diagnosis not present

## 2020-01-06 DIAGNOSIS — W19XXXD Unspecified fall, subsequent encounter: Secondary | ICD-10-CM | POA: Diagnosis not present

## 2020-01-06 DIAGNOSIS — R2689 Other abnormalities of gait and mobility: Secondary | ICD-10-CM | POA: Diagnosis not present

## 2020-01-06 DIAGNOSIS — K219 Gastro-esophageal reflux disease without esophagitis: Secondary | ICD-10-CM | POA: Diagnosis not present

## 2020-01-06 DIAGNOSIS — F411 Generalized anxiety disorder: Secondary | ICD-10-CM | POA: Diagnosis not present

## 2020-01-06 DIAGNOSIS — F339 Major depressive disorder, recurrent, unspecified: Secondary | ICD-10-CM | POA: Diagnosis not present

## 2020-01-19 DIAGNOSIS — Z20822 Contact with and (suspected) exposure to covid-19: Secondary | ICD-10-CM | POA: Diagnosis not present

## 2020-01-26 DIAGNOSIS — R05 Cough: Secondary | ICD-10-CM | POA: Diagnosis not present

## 2020-01-26 DIAGNOSIS — B9789 Other viral agents as the cause of diseases classified elsewhere: Secondary | ICD-10-CM | POA: Diagnosis not present

## 2020-01-26 DIAGNOSIS — R0981 Nasal congestion: Secondary | ICD-10-CM | POA: Diagnosis not present

## 2020-01-26 DIAGNOSIS — J028 Acute pharyngitis due to other specified organisms: Secondary | ICD-10-CM | POA: Diagnosis not present

## 2020-01-29 DIAGNOSIS — L821 Other seborrheic keratosis: Secondary | ICD-10-CM | POA: Diagnosis not present

## 2020-01-29 DIAGNOSIS — D485 Neoplasm of uncertain behavior of skin: Secondary | ICD-10-CM | POA: Diagnosis not present

## 2020-01-29 DIAGNOSIS — D0439 Carcinoma in situ of skin of other parts of face: Secondary | ICD-10-CM | POA: Diagnosis not present

## 2020-02-08 DIAGNOSIS — M1712 Unilateral primary osteoarthritis, left knee: Secondary | ICD-10-CM | POA: Diagnosis not present

## 2020-02-08 DIAGNOSIS — M1711 Unilateral primary osteoarthritis, right knee: Secondary | ICD-10-CM | POA: Diagnosis not present

## 2020-02-08 DIAGNOSIS — M8588 Other specified disorders of bone density and structure, other site: Secondary | ICD-10-CM | POA: Diagnosis not present

## 2020-02-08 DIAGNOSIS — M170 Bilateral primary osteoarthritis of knee: Secondary | ICD-10-CM | POA: Diagnosis not present

## 2020-02-23 DIAGNOSIS — H353131 Nonexudative age-related macular degeneration, bilateral, early dry stage: Secondary | ICD-10-CM | POA: Diagnosis not present

## 2020-03-09 DIAGNOSIS — D0439 Carcinoma in situ of skin of other parts of face: Secondary | ICD-10-CM | POA: Diagnosis not present

## 2020-03-30 DIAGNOSIS — Z9989 Dependence on other enabling machines and devices: Secondary | ICD-10-CM | POA: Diagnosis not present

## 2020-03-30 DIAGNOSIS — I10 Essential (primary) hypertension: Secondary | ICD-10-CM | POA: Diagnosis not present

## 2020-03-30 DIAGNOSIS — Z0000 Encounter for general adult medical examination without abnormal findings: Secondary | ICD-10-CM | POA: Diagnosis not present

## 2020-03-30 DIAGNOSIS — F411 Generalized anxiety disorder: Secondary | ICD-10-CM | POA: Diagnosis not present

## 2020-04-11 ENCOUNTER — Other Ambulatory Visit: Payer: Self-pay

## 2020-04-11 ENCOUNTER — Emergency Department: Payer: PPO

## 2020-04-11 ENCOUNTER — Encounter: Payer: Self-pay | Admitting: Emergency Medicine

## 2020-04-11 ENCOUNTER — Emergency Department
Admission: EM | Admit: 2020-04-11 | Discharge: 2020-04-11 | Disposition: A | Discharge status: Home / Self Care | Payer: PPO | Attending: Emergency Medicine | Admitting: Emergency Medicine

## 2020-04-11 DIAGNOSIS — I10 Essential (primary) hypertension: Secondary | ICD-10-CM | POA: Diagnosis not present

## 2020-04-11 DIAGNOSIS — S79911A Unspecified injury of right hip, initial encounter: Secondary | ICD-10-CM | POA: Diagnosis present

## 2020-04-11 DIAGNOSIS — M545 Low back pain, unspecified: Secondary | ICD-10-CM | POA: Diagnosis not present

## 2020-04-11 DIAGNOSIS — W1839XA Other fall on same level, initial encounter: Secondary | ICD-10-CM | POA: Diagnosis not present

## 2020-04-11 DIAGNOSIS — Z79899 Other long term (current) drug therapy: Secondary | ICD-10-CM | POA: Insufficient documentation

## 2020-04-11 DIAGNOSIS — M25551 Pain in right hip: Secondary | ICD-10-CM | POA: Diagnosis not present

## 2020-04-11 DIAGNOSIS — S32591A Other specified fracture of right pubis, initial encounter for closed fracture: Secondary | ICD-10-CM | POA: Diagnosis not present

## 2020-04-11 DIAGNOSIS — S3289XA Fracture of other parts of pelvis, initial encounter for closed fracture: Secondary | ICD-10-CM | POA: Diagnosis not present

## 2020-04-11 DIAGNOSIS — S73004A Unspecified dislocation of right hip, initial encounter: Secondary | ICD-10-CM | POA: Diagnosis not present

## 2020-04-11 DIAGNOSIS — S32501A Unspecified fracture of right pubis, initial encounter for closed fracture: Secondary | ICD-10-CM | POA: Diagnosis not present

## 2020-04-11 MED ORDER — TRAMADOL HCL 50 MG PO TABS: 50.0000 mg | ORAL_TABLET | Freq: Four times a day (QID) | ORAL | 0 refills | Status: AC | PRN

## 2020-04-11 NOTE — ED Notes (Signed)
To bathroom via wheelchair to void.

## 2020-04-11 NOTE — ED Triage Notes (Signed)
Says fell Thursday while going to the mailbox. Says she turned wrong and her walker fell over and she fell on her bottom.  Says she thought she was okay, but it is not getting better.  Pain that increases with walking in right hip and down right leg.

## 2020-04-11 NOTE — ED Provider Notes (Signed)
[facility] Emergency Department Provider Note  ____________________________________________  Time seen: Approximately 1:05 PM  I have reviewed the triage vital signs and the nursing notes.   HISTORY  Chief Complaint Leg Pain    HPI [name] Woods is a 79 y.o. female that presents to the emergency department for evaluation of right hip pain for 4 days.  Patient states that she was going to the mailbox on Thursday when she turned wrong with her walker and fell landing on her right side.  Pain has continued to fluctuate to her right hip since Thursday.  She does have a history of hip surgery and wants to be sure that she does not have a fracture.  Pain is worse when she walks.  Pain does not radiate.  No bowel or bladder dysfunction or saddle anesthesias.  No headache, neck pain, dizziness, chest pain.   Past Medical History:  Diagnosis Date  . Chest pain    a. 10/2014 [facility]: nl LV fxn, no wma/ischemia.  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 Guillain Barr syndrome ([place]) 1980  . Hypertension   . Spinal stenosis     Patient Active Problem List   Diagnosis Date Noted  . Hip fracture ([place]) 11/10/2019  . Closed fracture of neck of right femur ([place])   . Acute hip pain, right   . Generalized anxiety disorder   . Labile hypertension 09/16/2018  . Atypical chest pain 09/16/2018  . Chills 07/11/2017  . Essential hypertension 08/03/2016  . SOB (shortness of breath) 08/03/2016  . Anxiety 08/03/2016  . Leg edema 08/03/2016    Past Surgical History:  Procedure Laterality Date  . ABDOMINAL HYSTERECTOMY    . AUGMENTATION MAMMAPLASTY  2006  . BACK SURGERY    . CERVICAL SPINE SURGERY N/A 2016   for myelopathy  . HIP PINNING,CANNULATED Right 11/11/2019   Procedure: CANNULATED HIP PINNING;  Surgeon: Hessie Knows, MD;  Location: [facility] ORS;  Service: Orthopedics;  Laterality: Right;  . LUMBAR DISC SURGERY      Prior to Admission medications   Medication  Sig Start Date End Date Taking? Authorizing Provider  acetaminophen (TYLENOL) 325 MG tablet Take 2 tablets (650 mg total) by mouth every 6 (six) hours as needed for moderate pain (headache). 11/13/19   Nolberto Hanlon, MD  carvedilol (COREG) 25 MG tablet Take 1 tablet (25 mg total) by mouth 2 (two) times daily. 12/11/19 03/10/20  Minna Merritts, MD  cyclobenzaprine (FLEXERIL) 10 MG tablet Take 1 tablet (10 mg total) by mouth 2 (two) times daily as needed for muscle spasms (Can take before bedtime for muscle spasms at night). 11/13/19   Lattie Corns, PA-C  docusate sodium (COLACE) 100 MG capsule Take 1 capsule (100 mg total) by mouth 2 (two) times daily. 11/13/19   Nolberto Hanlon, MD  enoxaparin (LOVENOX) 40 MG/0.4ML injection Inject 0.4 mLs (40 mg total) into the skin daily. 11/13/19   Lattie Corns, PA-C  escitalopram (LEXAPRO) 10 MG tablet Take 10 mg by mouth daily. 10/19/19   [provider]  estradiol (ESTRACE) 1 MG tablet Take 1 mg by mouth daily.    [provider]  losartan (COZAAR) 100 MG tablet Take 1 tablet (100 mg total) by mouth daily. 12/11/19 03/10/20  Minna Merritts, MD  nitroGLYCERIN (NITROSTAT) 0.4 MG SL tablet Place 1 tablet (0.4 mg total) under the tongue every 5 (five) minutes as needed for chest pain. 07/21/19   Minna Merritts, MD  omeprazole (PRILOSEC) 40 MG capsule Take 40 mg by mouth daily. 08/24/15   [provider]  polyethylene glycol (MIRALAX / GLYCOLAX) 17 g packet Take 17 g by mouth daily. 11/13/19   Nolberto Hanlon, MD  senna (SENOKOT) 8.6 MG TABS tablet Take 1 tablet (8.6 mg total) by mouth 2 (two) times daily. 11/13/19   Nolberto Hanlon, MD  traMADol (ULTRAM) 50 MG tablet Take 1 tablet (50 mg total) by mouth every 6 (six) hours as needed for up to 3 days. 04/11/20 04/14/20  Laban Emperor, PA-C  zaleplon (SONATA) 5 MG capsule Take 5 mg by mouth at bedtime as needed. Patient not taking: Reported on 11/10/2019 09/10/19   [provider]     Allergies Azithromycin and Amlodipine  Family History  Problem Relation Age of Onset  . Hypertension Mother   . Hypertension Father   . Breast cancer Neg Hx     Social History Social History   Tobacco Use  . Smoking status: Never Smoker  . Smokeless tobacco: Never Used  Vaping Use  . Vaping Use: Never used  Substance Use Topics  . Alcohol use: Yes    Comment:  3/week  . Drug use: No     Review of Systems  Cardiovascular: No chest pain. Respiratory: No SOB. Gastrointestinal: No abdominal pain.  No nausea, no vomiting.  Musculoskeletal: Positive for right hip pain. Skin: Negative for rash, abrasions, lacerations, ecchymosis. Neurological: Negative for headaches, numbness or tingling   ____________________________________________   PHYSICAL EXAM:  VITAL SIGNS: ED Triage Vitals  Enc Vitals Group     BP 04/11/20 1037 129/64     Pulse Rate 04/11/20 1037 80     Resp 04/11/20 1037 14     Temp 04/11/20 1037 98.3 F (36.8 C)     Temp Source 04/11/20 1037 Oral     SpO2 04/11/20 1037 98 %     Weight 04/11/20 1038 134 lb (60.8 kg)     Height 04/11/20 1038 5\' 2"  (1.575 m)     Head Circumference --      Peak Flow --      Pain Score 04/11/20 1038 5     Pain Loc --      Pain Edu? --      Excl. in [place]? --      Constitutional: Alert and oriented. Well appearing and in no acute distress. Eyes: Conjunctivae are normal. PERRL. EOMI. Head: Atraumatic. ENT:      Ears:      Nose: No congestion/rhinnorhea.      Mouth/Throat: Mucous membranes are moist.  Neck: No stridor. Cardiovascular: Normal rate, regular rhythm.  Good peripheral circulation. Respiratory: Normal respiratory effort without tachypnea or retractions. Lungs CTAB. Good air entry to the bases with no decreased or absent breath sounds. Gastrointestinal: Bowel sounds 4 quadrants. Soft and nontender to palpation. No guarding or rigidity. No palpable masses. No distention.  Musculoskeletal: Full range of  motion to all extremities. No gross deformities appreciated.  Palpation to right mid buttocks.  No tenderness to palpation to lumbar spine.  No tenderness to palpation to right trochanteric bursa.  Full range of motion of right hip.  Strength equal in lower extremities bilaterally. Weightbearing. Antalgic gait. Neurologic:  Normal speech and language. No gross focal neurologic deficits are appreciated.  Skin:  Skin is warm, dry and intact. No rash noted. Psychiatric: Mood and affect are normal. Speech and behavior are normal. Patient exhibits appropriate insight and judgement.   ____________________________________________  LABS (all labs ordered are listed, but only abnormal results are displayed)  Labs Reviewed - No data to display ____________________________________________  EKG   ____________________________________________  RADIOLOGY Robinette Haines, personally viewed and evaluated these images (plain radiographs) as part of my medical decision making, as well as reviewing the written report by the radiologist.  DG Lumbar Spine 2-3 Views  Result Date: 04/11/2020 CLINICAL DATA:  Low back pain after fall several days ago. EXAM: LUMBAR SPINE - 2-3 VIEW COMPARISON:  October 21, 2017. FINDINGS: Stable moderate compression deformity of L1 vertebral body is noted consistent with old fracture. No acute fracture or spondylolisthesis is noted. Severe degenerative disc disease is noted at L2-3, L3-4 and L4-5 and L5-S1. IMPRESSION: Severe multilevel degenerative disc disease. Old L1 compression fracture. No acute abnormality is noted. Electronically Signed   By: Marijo Conception M.D.   On: 04/11/2020 13:23   CT Hip Right Wo Contrast  Result Date: 04/11/2020 CLINICAL DATA:  Right hip pain after fall last Thursday. EXAM: CT OF THE RIGHT HIP WITHOUT CONTRAST TECHNIQUE: Multidetector CT imaging of the right hip was performed according to the standard protocol. Multiplanar CT image reconstructions  were also generated. COMPARISON:  Right hip x-rays from same day. FINDINGS: Bones/Joint/Cartilage Acute nondisplaced fracture of the right inferior pubic ramus (series 2, image 54). No acute femur fracture. Chronic impacted right femoral neck fracture status post ORIF. Unchanged mild right hip osteoarthritis. No joint effusion. Ligaments Ligaments are suboptimally evaluated by CT. Muscles and Tendons Right gluteus minimus muscle atrophy. Soft tissue No fluid collection or hematoma. No soft tissue mass. Prior hysterectomy. IMPRESSION: 1. Acute nondisplaced fracture of the right inferior pubic ramus. 2. Chronic impacted right femoral neck fracture status post ORIF. Electronically Signed   By: Titus Dubin M.D.   On: 04/11/2020 14:45   DG Hip Unilat W or Wo Pelvis 2-3 Views Right  Result Date: 04/11/2020 CLINICAL DATA:  Hip and buttock pain, fall EXAM: DG HIP (WITH OR WITHOUT PELVIS) 2-3V RIGHT COMPARISON:  11/11/2019 FINDINGS: Osteopenia. No displaced fracture or dislocation of the right hip or pelvis. There is screw fixation of a subcapital fracture of the right without evidence of perihardware fracture or lucency and without obvious change in fracture alignment compared to prior intraoperative fluoroscopic images. The included proximal left femur is unremarkable in single frontal view. IMPRESSION: 1. No displaced fracture or dislocation of the right hip or pelvis. Please note that plain radiographs are limited in sensitivity for fracture in the setting of osteopenia. Consider CT or MRI to most sensitively assess for fracture. 2. There is screw fixation of a subcapital fracture of the right without evidence of perihardware fracture or lucency and without obvious change in fracture alignment compared to prior intraoperative fluoroscopic images. Electronically Signed   By: Eddie Candle M.D.   On: 04/11/2020 13:27    ____________________________________________    PROCEDURES  Procedure(s) performed:     Procedures    Medications - No data to display   ____________________________________________   INITIAL IMPRESSION / ASSESSMENT AND PLAN / ED COURSE  Pertinent labs & imaging results that were available during my care of the patient were reviewed by me and considered in my medical decision making (see chart for details).  Review of the [place] CSRS was performed in accordance of the [place] prior to dispensing any controlled drugs.   Patient presented to emergency department for evaluation of continued right hip pain after a fall 4 days ago.  Vital signs and  exam are reassuring.  X-rays are negative for acute bony abnormality.  CT scan was ordered to further evaluate.  CT scan reveal a nondisplaced inferior rami fracture.  Patient has a walker at home and feels comfortable going home.  She does have home health 3 times a week and states they can come more often.  She does not wish to discuss rehab options at this time.   patient will be discharged home with prescriptions for tramadol. Patient is to follow up with orthopedics as directed.  Patient already follows with Dr. Rudene Christians and will call him in the morning.  Patient is given ED precautions to return to the ED for any worsening or new symptoms.   Adeleine Clodfelter Fayad was evaluated in Emergency Department on 04/11/2020 for the symptoms described in the history of present illness. She was evaluated in the context of the global COVID-19 pandemic, which necessitated consideration that the patient might be at risk for infection with the SARS-CoV-2 virus that causes COVID-19. Institutional protocols and algorithms that pertain to the evaluation of patients at risk for COVID-19 are in a state of rapid change based on information released by regulatory bodies including the CDC and federal and state organizations. These policies and algorithms were followed during the patient's care in the ED.   ____________________________________________  FINAL  CLINICAL IMPRESSION(S) / ED DIAGNOSES  Final diagnoses:  Closed fracture of other parts of pelvis, initial encounter ([place])      NEW MEDICATIONS STARTED DURING THIS VISIT:  ED Discharge Orders         Ordered    traMADol (ULTRAM) 50 MG tablet  Every 6 hours PRN        04/11/20 1559              This chart was dictated using voice recognition software/Dragon. Despite best efforts to proofread, errors can occur which can change the meaning. Any change was purely unintentional.    Laban Emperor, PA-C 04/11/20 [place], MD 04/12/20 [phone]

## 2020-04-15 ENCOUNTER — Other Ambulatory Visit: Payer: Self-pay | Admitting: Orthopedic Surgery

## 2020-04-15 DIAGNOSIS — B350 Tinea barbae and tinea capitis: Secondary | ICD-10-CM | POA: Diagnosis not present

## 2020-04-15 DIAGNOSIS — Z79891 Long term (current) use of opiate analgesic: Secondary | ICD-10-CM | POA: Diagnosis not present

## 2020-04-15 DIAGNOSIS — R2689 Other abnormalities of gait and mobility: Secondary | ICD-10-CM | POA: Diagnosis not present

## 2020-04-15 DIAGNOSIS — Z20822 Contact with and (suspected) exposure to covid-19: Secondary | ICD-10-CM | POA: Diagnosis not present

## 2020-04-15 DIAGNOSIS — S72001A Fracture of unspecified part of neck of right femur, initial encounter for closed fracture: Secondary | ICD-10-CM | POA: Diagnosis not present

## 2020-04-15 DIAGNOSIS — K219 Gastro-esophageal reflux disease without esophagitis: Secondary | ICD-10-CM | POA: Diagnosis not present

## 2020-04-15 DIAGNOSIS — M4802 Spinal stenosis, cervical region: Secondary | ICD-10-CM | POA: Diagnosis not present

## 2020-04-15 DIAGNOSIS — Z01812 Encounter for preprocedural laboratory examination: Secondary | ICD-10-CM | POA: Diagnosis not present

## 2020-04-15 DIAGNOSIS — M25551 Pain in right hip: Secondary | ICD-10-CM

## 2020-04-15 DIAGNOSIS — L740 Miliaria rubra: Secondary | ICD-10-CM | POA: Diagnosis not present

## 2020-04-15 DIAGNOSIS — S72001D Fracture of unspecified part of neck of right femur, subsequent encounter for closed fracture with routine healing: Secondary | ICD-10-CM | POA: Diagnosis not present

## 2020-04-15 DIAGNOSIS — S32501A Unspecified fracture of right pubis, initial encounter for closed fracture: Secondary | ICD-10-CM | POA: Diagnosis not present

## 2020-04-15 DIAGNOSIS — S3219XA Other fracture of sacrum, initial encounter for closed fracture: Secondary | ICD-10-CM | POA: Diagnosis not present

## 2020-04-15 DIAGNOSIS — S32591A Other specified fracture of right pubis, initial encounter for closed fracture: Secondary | ICD-10-CM

## 2020-04-15 DIAGNOSIS — W19XXXD Unspecified fall, subsequent encounter: Secondary | ICD-10-CM | POA: Diagnosis not present

## 2020-04-15 DIAGNOSIS — F411 Generalized anxiety disorder: Secondary | ICD-10-CM | POA: Diagnosis not present

## 2020-04-15 DIAGNOSIS — N959 Unspecified menopausal and perimenopausal disorder: Secondary | ICD-10-CM | POA: Diagnosis not present

## 2020-04-15 DIAGNOSIS — X58XXXD Exposure to other specified factors, subsequent encounter: Secondary | ICD-10-CM | POA: Diagnosis not present

## 2020-04-15 DIAGNOSIS — M4800 Spinal stenosis, site unspecified: Secondary | ICD-10-CM | POA: Diagnosis not present

## 2020-04-15 DIAGNOSIS — Z881 Allergy status to other antibiotic agents status: Secondary | ICD-10-CM | POA: Diagnosis not present

## 2020-04-15 DIAGNOSIS — Z79899 Other long term (current) drug therapy: Secondary | ICD-10-CM | POA: Diagnosis not present

## 2020-04-15 DIAGNOSIS — Z741 Need for assistance with personal care: Secondary | ICD-10-CM | POA: Diagnosis not present

## 2020-04-15 DIAGNOSIS — G610 Guillain-Barre syndrome: Secondary | ICD-10-CM | POA: Diagnosis not present

## 2020-04-15 DIAGNOSIS — S32591D Other specified fracture of right pubis, subsequent encounter for fracture with routine healing: Secondary | ICD-10-CM | POA: Diagnosis not present

## 2020-04-15 DIAGNOSIS — G2581 Restless legs syndrome: Secondary | ICD-10-CM | POA: Diagnosis not present

## 2020-04-15 DIAGNOSIS — F419 Anxiety disorder, unspecified: Secondary | ICD-10-CM | POA: Diagnosis not present

## 2020-04-15 DIAGNOSIS — M8448XA Pathological fracture, other site, initial encounter for fracture: Secondary | ICD-10-CM | POA: Diagnosis not present

## 2020-04-15 DIAGNOSIS — I10 Essential (primary) hypertension: Secondary | ICD-10-CM | POA: Diagnosis not present

## 2020-04-15 DIAGNOSIS — Z888 Allergy status to other drugs, medicaments and biological substances status: Secondary | ICD-10-CM | POA: Diagnosis not present

## 2020-04-15 DIAGNOSIS — F339 Major depressive disorder, recurrent, unspecified: Secondary | ICD-10-CM | POA: Diagnosis not present

## 2020-04-15 DIAGNOSIS — M533 Sacrococcygeal disorders, not elsewhere classified: Secondary | ICD-10-CM | POA: Diagnosis not present

## 2020-04-15 DIAGNOSIS — M48062 Spinal stenosis, lumbar region with neurogenic claudication: Secondary | ICD-10-CM | POA: Diagnosis not present

## 2020-04-15 DIAGNOSIS — Z79890 Hormone replacement therapy (postmenopausal): Secondary | ICD-10-CM | POA: Diagnosis not present

## 2020-04-15 DIAGNOSIS — W19XXXA Unspecified fall, initial encounter: Secondary | ICD-10-CM | POA: Diagnosis not present

## 2020-04-15 DIAGNOSIS — S3210XA Unspecified fracture of sacrum, initial encounter for closed fracture: Secondary | ICD-10-CM | POA: Diagnosis not present

## 2020-04-15 DIAGNOSIS — G4709 Other insomnia: Secondary | ICD-10-CM | POA: Diagnosis not present

## 2020-04-15 DIAGNOSIS — Z0389 Encounter for observation for other suspected diseases and conditions ruled out: Secondary | ICD-10-CM | POA: Diagnosis not present

## 2020-04-15 DIAGNOSIS — M6281 Muscle weakness (generalized): Secondary | ICD-10-CM | POA: Diagnosis not present

## 2020-04-15 DIAGNOSIS — E871 Hypo-osmolality and hyponatremia: Secondary | ICD-10-CM | POA: Diagnosis not present

## 2020-04-15 DIAGNOSIS — R278 Other lack of coordination: Secondary | ICD-10-CM | POA: Diagnosis not present

## 2020-04-15 DIAGNOSIS — M8580 Other specified disorders of bone density and structure, unspecified site: Secondary | ICD-10-CM | POA: Diagnosis not present

## 2020-04-18 DIAGNOSIS — E871 Hypo-osmolality and hyponatremia: Secondary | ICD-10-CM

## 2020-04-18 DIAGNOSIS — I10 Essential (primary) hypertension: Secondary | ICD-10-CM

## 2020-04-18 DIAGNOSIS — S32501A Unspecified fracture of right pubis, initial encounter for closed fracture: Secondary | ICD-10-CM

## 2020-04-18 DIAGNOSIS — M4802 Spinal stenosis, cervical region: Secondary | ICD-10-CM

## 2020-04-18 DIAGNOSIS — F411 Generalized anxiety disorder: Secondary | ICD-10-CM

## 2020-04-18 DIAGNOSIS — K219 Gastro-esophageal reflux disease without esophagitis: Secondary | ICD-10-CM

## 2020-04-19 ENCOUNTER — Other Ambulatory Visit: Payer: Self-pay

## 2020-04-19 ENCOUNTER — Ambulatory Visit
Admission: RE | Admit: 2020-04-19 | Discharge: 2020-04-19 | Disposition: A | Discharge status: Home / Self Care | Payer: PPO | Source: Ambulatory Visit | Attending: Orthopedic Surgery | Admitting: Orthopedic Surgery

## 2020-04-19 DIAGNOSIS — M533 Sacrococcygeal disorders, not elsewhere classified: Secondary | ICD-10-CM

## 2020-04-19 DIAGNOSIS — S72001A Fracture of unspecified part of neck of right femur, initial encounter for closed fracture: Secondary | ICD-10-CM | POA: Diagnosis not present

## 2020-04-19 DIAGNOSIS — S3219XA Other fracture of sacrum, initial encounter for closed fracture: Secondary | ICD-10-CM | POA: Diagnosis not present

## 2020-04-19 DIAGNOSIS — S32591A Other specified fracture of right pubis, initial encounter for closed fracture: Secondary | ICD-10-CM | POA: Insufficient documentation

## 2020-04-19 DIAGNOSIS — M25551 Pain in right hip: Secondary | ICD-10-CM | POA: Insufficient documentation

## 2020-04-20 DIAGNOSIS — G2581 Restless legs syndrome: Secondary | ICD-10-CM | POA: Diagnosis not present

## 2020-04-20 DIAGNOSIS — B350 Tinea barbae and tinea capitis: Secondary | ICD-10-CM | POA: Diagnosis not present

## 2020-04-20 DIAGNOSIS — L740 Miliaria rubra: Secondary | ICD-10-CM | POA: Diagnosis not present

## 2020-04-22 ENCOUNTER — Other Ambulatory Visit: Payer: Self-pay | Admitting: Orthopedic Surgery

## 2020-04-25 ENCOUNTER — Other Ambulatory Visit
Admission: RE | Admit: 2020-04-25 | Discharge: 2020-04-25 | Disposition: A | Discharge status: Home / Self Care | Payer: PPO | Source: Ambulatory Visit | Attending: Orthopedic Surgery | Admitting: Orthopedic Surgery

## 2020-04-25 ENCOUNTER — Encounter
Admission: RE | Admit: 2020-04-25 | Discharge: 2020-04-25 | Disposition: A | Discharge status: Home / Self Care | Payer: PPO | Source: Ambulatory Visit | Attending: Orthopedic Surgery | Admitting: Orthopedic Surgery

## 2020-04-25 ENCOUNTER — Other Ambulatory Visit: Payer: Self-pay

## 2020-04-25 DIAGNOSIS — M8448XA Pathological fracture, other site, initial encounter for fracture: Secondary | ICD-10-CM | POA: Diagnosis not present

## 2020-04-25 DIAGNOSIS — Z20822 Contact with and (suspected) exposure to covid-19: Secondary | ICD-10-CM | POA: Insufficient documentation

## 2020-04-25 DIAGNOSIS — Z01812 Encounter for preprocedural laboratory examination: Secondary | ICD-10-CM | POA: Insufficient documentation

## 2020-04-25 DIAGNOSIS — S32591A Other specified fracture of right pubis, initial encounter for closed fracture: Secondary | ICD-10-CM | POA: Diagnosis not present

## 2020-04-25 DIAGNOSIS — M533 Sacrococcygeal disorders, not elsewhere classified: Secondary | ICD-10-CM | POA: Diagnosis not present

## 2020-04-25 HISTORY — DX: Malignant (primary) neoplasm, unspecified: C80.1

## 2020-04-25 HISTORY — DX: Gastro-esophageal reflux disease without esophagitis: K21.9

## 2020-04-25 HISTORY — DX: Unspecified osteoarthritis, unspecified site: M19.90

## 2020-04-25 HISTORY — DX: Other specified postprocedural states: Z98.890

## 2020-04-25 LAB — SARS CORONAVIRUS 2 (TAT 6-24 HRS): SARS Coronavirus 2: NEGATIVE

## 2020-04-25 NOTE — Patient Instructions (Signed)
Your procedure is scheduled on:04-26-20 Report to the Registration Desk on the 1st floor of the [facility] @ 2 PM   REMEMBER: Instructions that are not followed completely may result in serious medical risk, up to and including death; or upon the discretion of your surgeon and anesthesiologist your surgery may need to be rescheduled.  Do not eat food after midnight the night before surgery.  No gum chewing, lozengers or hard candies.  You may however, drink CLEAR liquids up to 2 hours before you are scheduled to arrive for your surgery. Do not drink anything within 2 hours of your scheduled arrival time.  Clear liquids include: - water  - apple juice without pulp - gatorade (not RED, PURPLE, OR BLUE) - black coffee or tea (Do NOT add milk or creamers to the coffee or tea) Do NOT drink anything that is not on this list.  TAKE THESE MEDICATIONS THE MORNING OF SURGERY WITH A SIP OF WATER: -CARVEDILOL -LEXAPRO -PRILOSEC-take one the night before and one on the morning of surgery - helps to prevent nausea after surgery.) -YOU MAY TAKE TYLENOL OR TRAMADOL IF NEEDED  One week prior to surgery: Stop Anti-inflammatories (NSAIDS) such as Advil, Aleve, Ibuprofen, Motrin, Naproxen, Naprosyn and Aspirin based products such as Excedrin, Goodys Powder, BC Powder-OK TO TAKE TYLENOL/TRAMADOL IF NEEDED   No Alcohol for 24 hours before or after surgery.  No Smoking including e-cigarettes for 24 hours prior to surgery.  No chewable tobacco products for at least 6 hours prior to surgery.  No nicotine patches on the day of surgery.  Do not use any "recreational" drugs for at least a week prior to your surgery.  Please be advised that the combination of cocaine and anesthesia may have negative outcomes, up to and including death. If you test positive for cocaine, your surgery will be cancelled.  On the morning of surgery brush your teeth with toothpaste and water, you may rinse your mouth with  mouthwash if you wish. Do not swallow any toothpaste or mouthwash.  Do not wear jewelry, make-up, hairpins, clips or nail polish.  Do not wear lotions, powders, or perfumes.   Do not shave body from the neck down 48 hours prior to surgery just in case you cut yourself which could leave a site for infection.  Also, freshly shaved skin may become irritated if using the CHG soap.  Contact lenses, hearing aids and dentures may not be worn into surgery.  Do not bring valuables to the hospital. [facility] is not responsible for any missing/lost belongings or valuables.   Notify your doctor if there is any change in your medical condition (cold, fever, infection).  Wear comfortable clothing (specific to your surgery type) to the hospital.  Plan for stool softeners for home use; pain medications have a tendency to cause constipation. You can also help prevent constipation by eating foods high in fiber such as fruits and vegetables and drinking plenty of fluids as your diet allows.  After surgery, you can help prevent lung complications by doing breathing exercises.  Take deep breaths and cough every 1-2 hours. Your doctor may order a device called an Incentive Spirometer to help you take deep breaths. When coughing or sneezing, hold a pillow firmly against your incision with both hands. This is called "splinting." Doing this helps protect your incision. It also decreases belly discomfort.  If you are being admitted to the hospital overnight, leave your suitcase in the car. After surgery it may  be brought to your room.  If you are being discharged the day of surgery, you will not be allowed to drive home. You will need a responsible adult (18 years or older) to drive you home and stay with you that night.   If you are taking public transportation, you will need to have a responsible adult (18 years or older) with you. Please confirm with your physician that it is acceptable to use public  transportation.   Please call the [facility] at [phone] if you have any questions about these instructions.  Visitation Policy:  Patients undergoing a surgery or procedure may have one family member or support person with them as long as that person is not COVID-19 positive or experiencing its symptoms.  That person may remain in the waiting area during the procedure.  Inpatient Visitation Update:   In an effort to ensure the safety of our team members and our patients, we are implementing a change to our visitation policy:  Effective Monday, Aug. 9, at 7 a.m., inpatients will be allowed one support person.  o The support person may change daily.  o The support person must pass our screening, gel in and out, and wear a mask at all times, including in the patient's room.  o Patients must also wear a mask when staff or their support person are in the room.  o Masking is required regardless of vaccination status.  Systemwide, no visitors 17 or younger.

## 2020-04-26 ENCOUNTER — Ambulatory Visit: Payer: PPO

## 2020-04-26 ENCOUNTER — Ambulatory Visit: Payer: PPO | Admitting: Anesthesiology

## 2020-04-26 ENCOUNTER — Ambulatory Visit
Admission: RE | Admit: 2020-04-26 | Discharge: 2020-04-26 | Disposition: A | Discharge status: Skilled Nursing Facility | Payer: PPO | Attending: Orthopedic Surgery | Admitting: Orthopedic Surgery

## 2020-04-26 ENCOUNTER — Encounter: Payer: Self-pay | Admitting: Orthopedic Surgery

## 2020-04-26 ENCOUNTER — Other Ambulatory Visit: Payer: Self-pay

## 2020-04-26 ENCOUNTER — Encounter
Admission: RE | Disposition: A | Discharge status: Skilled Nursing Facility | Payer: Self-pay | Source: Home / Self Care | Attending: Orthopedic Surgery

## 2020-04-26 DIAGNOSIS — Z79890 Hormone replacement therapy (postmenopausal): Secondary | ICD-10-CM | POA: Diagnosis not present

## 2020-04-26 DIAGNOSIS — S32591A Other specified fracture of right pubis, initial encounter for closed fracture: Secondary | ICD-10-CM | POA: Insufficient documentation

## 2020-04-26 DIAGNOSIS — Z419 Encounter for procedure for purposes other than remedying health state, unspecified: Secondary | ICD-10-CM

## 2020-04-26 DIAGNOSIS — S3210XA Unspecified fracture of sacrum, initial encounter for closed fracture: Secondary | ICD-10-CM | POA: Diagnosis not present

## 2020-04-26 DIAGNOSIS — M533 Sacrococcygeal disorders, not elsewhere classified: Secondary | ICD-10-CM | POA: Diagnosis not present

## 2020-04-26 DIAGNOSIS — Z0389 Encounter for observation for other suspected diseases and conditions ruled out: Secondary | ICD-10-CM | POA: Diagnosis not present

## 2020-04-26 DIAGNOSIS — M8448XA Pathological fracture, other site, initial encounter for fracture: Secondary | ICD-10-CM | POA: Diagnosis present

## 2020-04-26 DIAGNOSIS — W19XXXA Unspecified fall, initial encounter: Secondary | ICD-10-CM | POA: Insufficient documentation

## 2020-04-26 DIAGNOSIS — Z888 Allergy status to other drugs, medicaments and biological substances status: Secondary | ICD-10-CM | POA: Insufficient documentation

## 2020-04-26 DIAGNOSIS — Z79891 Long term (current) use of opiate analgesic: Secondary | ICD-10-CM | POA: Insufficient documentation

## 2020-04-26 DIAGNOSIS — Z79899 Other long term (current) drug therapy: Secondary | ICD-10-CM | POA: Diagnosis not present

## 2020-04-26 DIAGNOSIS — Z881 Allergy status to other antibiotic agents status: Secondary | ICD-10-CM | POA: Insufficient documentation

## 2020-04-26 DIAGNOSIS — M25551 Pain in right hip: Secondary | ICD-10-CM | POA: Diagnosis not present

## 2020-04-26 HISTORY — PX: SACROPLASTY: SHX6797

## 2020-04-26 LAB — BASIC METABOLIC PANEL
Anion gap: 8 (ref 5–15)
BUN: 11 mg/dL (ref 8–23)
CO2: 25 mmol/L (ref 22–32)
Calcium: 9.2 mg/dL (ref 8.9–10.3)
Chloride: 95 mmol/L — ABNORMAL LOW (ref 98–111)
Creatinine, Ser: 0.53 mg/dL (ref 0.44–1.00)
GFR, Estimated: >60 mL/min (ref >60)
Glucose, Bld: 98 mg/dL (ref 70–99)
Potassium: 4 mmol/L (ref 3.5–5.1)
Sodium: 128 mmol/L — ABNORMAL LOW (ref 135–145)

## 2020-04-26 LAB — CBC
HCT: 40.7 % (ref 36.0–46.0)
Hemoglobin: 13.9 g/dL (ref 12.0–15.0)
MCH: 30.8 pg (ref 26.0–34.0)
MCHC: 34.2 g/dL (ref 30.0–36.0)
MCV: 90.2 fL (ref 80.0–100.0)
Platelets: 418 10*3/uL — ABNORMAL HIGH (ref 150–400)
RBC: 4.51 MIL/uL (ref 3.87–5.11)
RDW: 13.3 % (ref 11.5–15.5)
WBC: 9.7 10*3/uL (ref 4.0–10.5)
nRBC: 0 % (ref 0.0–0.2)

## 2020-04-26 SURGERY — SACROPLASTY
Anesthesia: General | Laterality: Bilateral

## 2020-04-26 MED ORDER — METOCLOPRAMIDE HCL 10 MG PO TABS: 5.0000 mg | ORAL_TABLET | Freq: Three times a day (TID) | ORAL | Status: DC | PRN

## 2020-04-26 MED ORDER — CHLORHEXIDINE GLUCONATE 0.12 % MT SOLN
15.0000 mL | Freq: Once | OROMUCOSAL | Status: AC
  Administered 2020-04-26: 15:00:00 15 mL via OROMUCOSAL

## 2020-04-26 MED ORDER — ONDANSETRON HCL 4 MG/2ML IJ SOLN: 4.0000 mg | Freq: Once | INTRAMUSCULAR | Status: DC | PRN

## 2020-04-26 MED ORDER — ACETAMINOPHEN 10 MG/ML IV SOLN
INTRAVENOUS | Status: DC | PRN
  Administered 2020-04-26: 1000 mg via INTRAVENOUS

## 2020-04-26 MED ORDER — LIDOCAINE HCL (PF) 2 % IJ SOLN
INTRAMUSCULAR | Status: AC
  Filled 2020-04-26: qty 5

## 2020-04-26 MED ORDER — CEFAZOLIN SODIUM-DEXTROSE 2-4 GM/100ML-% IV SOLN
2.0000 g | INTRAVENOUS | Status: AC
  Administered 2020-04-26: 18:00:00 2 g via INTRAVENOUS

## 2020-04-26 MED ORDER — BUPIVACAINE-EPINEPHRINE (PF) 0.5% -1:200000 IJ SOLN
INTRAMUSCULAR | Status: DC | PRN
  Administered 2020-04-26: 15 mL

## 2020-04-26 MED ORDER — CHLORHEXIDINE GLUCONATE 0.12 % MT SOLN
OROMUCOSAL | Status: AC
  Filled 2020-04-26: qty 15

## 2020-04-26 MED ORDER — DEXMEDETOMIDINE (PRECEDEX) IN NS 20 MCG/5ML (4 MCG/ML) IV SYRINGE
PREFILLED_SYRINGE | INTRAVENOUS | Status: DC | PRN
  Administered 2020-04-26 (×3): 4 ug via INTRAVENOUS

## 2020-04-26 MED ORDER — LIDOCAINE HCL (PF) 1 % IJ SOLN
INTRAMUSCULAR | Status: DC | PRN
  Administered 2020-04-26: 25 mL

## 2020-04-26 MED ORDER — ONDANSETRON HCL 4 MG/2ML IJ SOLN: 4.0000 mg | Freq: Four times a day (QID) | INTRAMUSCULAR | Status: DC | PRN

## 2020-04-26 MED ORDER — PROPOFOL 500 MG/50ML IV EMUL
INTRAVENOUS | Status: DC | PRN
  Administered 2020-04-26: 50 ug/kg/min via INTRAVENOUS

## 2020-04-26 MED ORDER — FENTANYL CITRATE (PF) 100 MCG/2ML IJ SOLN
INTRAMUSCULAR | Status: AC
  Filled 2020-04-26: qty 2

## 2020-04-26 MED ORDER — ACETAMINOPHEN 10 MG/ML IV SOLN
INTRAVENOUS | Status: AC
  Filled 2020-04-26: qty 100

## 2020-04-26 MED ORDER — LACTATED RINGERS IV SOLN: INTRAVENOUS | Status: DC

## 2020-04-26 MED ORDER — FENTANYL CITRATE (PF) 100 MCG/2ML IJ SOLN: 25.0000 ug | INTRAMUSCULAR | Status: DC | PRN

## 2020-04-26 MED ORDER — ORAL CARE MOUTH RINSE: 15.0000 mL | Freq: Once | OROMUCOSAL | Status: AC

## 2020-04-26 MED ORDER — ONDANSETRON HCL 4 MG PO TABS: 4.0000 mg | ORAL_TABLET | Freq: Four times a day (QID) | ORAL | Status: DC | PRN

## 2020-04-26 MED ORDER — METOCLOPRAMIDE HCL 5 MG/ML IJ SOLN: 5.0000 mg | Freq: Three times a day (TID) | INTRAMUSCULAR | Status: DC | PRN

## 2020-04-26 MED ORDER — BUPIVACAINE-EPINEPHRINE (PF) 0.5% -1:200000 IJ SOLN
INTRAMUSCULAR | Status: AC
  Filled 2020-04-26: qty 30

## 2020-04-26 MED ORDER — DEXMEDETOMIDINE (PRECEDEX) IN NS 20 MCG/5ML (4 MCG/ML) IV SYRINGE
PREFILLED_SYRINGE | INTRAVENOUS | Status: AC
  Filled 2020-04-26: qty 5

## 2020-04-26 MED ORDER — PHENYLEPHRINE HCL (PRESSORS) 10 MG/ML IV SOLN
INTRAVENOUS | Status: DC | PRN
  Administered 2020-04-26 (×4): 50 ug via INTRAVENOUS

## 2020-04-26 MED ORDER — SODIUM CHLORIDE 0.9 % IV SOLN: INTRAVENOUS | Status: DC

## 2020-04-26 MED ORDER — CEFAZOLIN SODIUM-DEXTROSE 2-4 GM/100ML-% IV SOLN
INTRAVENOUS | Status: AC
  Filled 2020-04-26: qty 100

## 2020-04-26 MED ORDER — FENTANYL CITRATE (PF) 100 MCG/2ML IJ SOLN
INTRAMUSCULAR | Status: DC | PRN
  Administered 2020-04-26 (×3): 25 ug via INTRAVENOUS

## 2020-04-26 SURGICAL SUPPLY — 21 items
ADH SKN CLS APL DERMABOND .7 (GAUZE/BANDAGES/DRESSINGS) ×1
CEMENT KYPHON CX01A KIT/MIXER (Cement) ×2 IMPLANT
COVER WAND RF STERILE (DRAPES) ×3 IMPLANT
DERMABOND ADVANCED (GAUZE/BANDAGES/DRESSINGS) ×2
DERMABOND ADVANCED .7 DNX12 (GAUZE/BANDAGES/DRESSINGS) ×1 IMPLANT
DRAPE C-ARM XRAY 36X54 (DRAPES) ×3 IMPLANT
DURAPREP 26ML APPLICATOR (WOUND CARE) ×3 IMPLANT
GLOVE SURG SYN 9.0  PF PI (GLOVE) ×2
GLOVE SURG SYN 9.0 PF PI (GLOVE) ×1 IMPLANT
GOWN SRG 2XL LVL 4 RGLN SLV (GOWNS) ×1 IMPLANT
GOWN STRL NON-REIN 2XL LVL4 (GOWNS) ×3
GOWN STRL REUS W/ TWL LRG LVL3 (GOWN DISPOSABLE) ×1 IMPLANT
GOWN STRL REUS W/TWL LRG LVL3 (GOWN DISPOSABLE) ×3
KIT OSTEOCOOL BONE ACCESS 10G (MISCELLANEOUS) ×6 IMPLANT
KIT TURNOVER KIT A (KITS) ×3 IMPLANT
MANIFOLD NEPTUNE II (INSTRUMENTS) ×3 IMPLANT
PACK KYPHOPLASTY (MISCELLANEOUS) ×3 IMPLANT
SWABSTK COMLB BENZOIN TINCTURE (MISCELLANEOUS) ×3 IMPLANT
SYS CARTRIDGE BONE CEMENT 8ML (SYSTAGENIX WOUND MANAGEMENT) ×6
SYSTEM CARTRIDG BONE CEMNT 8ML (SYSTAGENIX WOUND MANAGEMENT) ×1 IMPLANT
SYSTEM GUN BONE FILLER SZ2 (MISCELLANEOUS) ×5 IMPLANT

## 2020-04-26 NOTE — Anesthesia Postprocedure Evaluation (Signed)
Anesthesia Post Note  Patient: [name] Woods  Procedure(s) Performed: SACROPLASTY (Bilateral )  Patient location during evaluation: PACU Anesthesia Type: General Level of consciousness: awake and alert Pain management: pain level controlled Vital Signs Assessment: post-procedure vital signs reviewed and stable Respiratory status: spontaneous breathing, nonlabored ventilation, respiratory function stable and patient connected to nasal cannula oxygen Cardiovascular status: blood pressure returned to baseline and stable Postop Assessment: no apparent nausea or vomiting Anesthetic complications: no   No complications documented.   Last Vitals:  Vitals:   04/26/20 1837 04/26/20 1912  BP: 130/74 (!) 155/85  Pulse: 69 70  Resp: 13   Temp:    SpO2: 96% 96%    Last Pain:  Vitals:   04/26/20 1912  TempSrc:   PainSc: 0-No pain                 Arita Miss

## 2020-04-26 NOTE — Transfer of Care (Signed)
Immediate Anesthesia Transfer of Care Note  Patient: [name] Woods  Procedure(s) Performed: SACROPLASTY (Bilateral )  Patient Location: PACU  Anesthesia Type:General  Level of Consciousness: awake, drowsy and patient cooperative  Airway & Oxygen Therapy: Patient Spontanous Breathing  Post-op Assessment: Report given to RN and Post -op Vital signs reviewed and stable  Post vital signs: Reviewed and stable  Last Vitals:  Vitals Value Taken Time  BP 108/52 04/26/20 1807  Temp    Pulse 72 04/26/20 1809  Resp 13 04/26/20 1809  SpO2 95 % 04/26/20 1809  Vitals shown include unvalidated device data.  Last Pain:  Vitals:   04/26/20 1415  TempSrc: Oral  PainSc: 0-No pain         Complications: No complications documented.

## 2020-04-26 NOTE — Discharge Instructions (Addendum)
AMBULATORY SURGERY  DISCHARGE INSTRUCTIONS   1) The drugs that you were given will stay in your system until tomorrow so for the next 24 hours you should not:  A) Drive an automobile B) Make any legal decisions C) Drink any alcoholic beverage   2) You may resume regular meals tomorrow.  Today it is better to start with liquids and gradually work up to solid foods.  You may eat anything you prefer, but it is better to start with liquids, then soup and crackers, and gradually work up to solid foods.   3) Please notify your doctor immediately if you have any unusual bleeding, trouble breathing, redness and pain at the surgery site, drainage, fever, or pain not relieved by medication. 4)   5) Your post-operative visit with Dr.                                     is: Date:                        Time:    Please call to schedule your post-operative visit.  6) Additional Instructions:       Take it easy today and try to resume walking as much as possible starting tomorrow. Band-Aids to come off on Thursday then okay to shower. No activity restrictions. Call office if you are having problems.

## 2020-04-26 NOTE — Op Note (Signed)
04/26/2020  5:59 PM  PATIENT:  [name] Woods   MRN: [id number]   PRE-OPERATIVE DIAGNOSIS:   Sacral insufficiency fracture   POST-OPERATIVE DIAGNOSIS: Same   PROCEDURE:  Procedure(s): Lumbosacral vertebroplasty  SURGEON: Laurene Footman, MD   ASSISTANTS: None   ANESTHESIA:   local and MAC   EBL:  No intake/output data recorded.   BLOOD ADMINISTERED:none   DRAINS: none    LOCAL MEDICATIONS USED:  MARCAINE    and XYLOCAINE    SPECIMEN:   None   DISPOSITION OF SPECIMEN:  Applicable   COUNTS:  YES   TOURNIQUET:  * No tourniquets in log *   IMPLANTS: Bone cement   DICTATION: .Dragon Dictation  patient was brought to the operating room and after adequate anesthesia was obtained the patient was placed prone.  C arm was brought in in good visualization of the affected level obtained on both AP and lateral projections.  After patient identification and timeout procedures were completed, local anesthetic was infiltrated with 10 cc 1% Xylocaine infiltrated subcutaneously.  This is done the area on the each side of the planned approach.  The back was then prepped and draped in the usual sterile manner and repeat timeout procedure carried out.  A spinal needle was brought down to the sacrum on the each side  and a 50-50 mix of 1% Xylocaine half percent Sensorcaine with epinephrine total of 20 cc injected on each side.  After allowing this to set a small incision was made and the trocar was advanced into the sacrum care being taken to stay between the SI joint and the neuroforamen 1st on the left than the right..  Drilling was carried out When the cement was appropriate consistency 4-1/2 cc were injected on the right and 2-1/2 cc on the left into the sacrum without extravasation, good fill without extravasation into the SI joint or neuroforamen.  After the cement had set the trochar was removed and permanent C-arm views obtained.  The wound was closed with Dermabond followed by  Band-Aid   PLAN OF CARE:  Discharge back to [facility]:  PACU - hemodynamically stable.

## 2020-04-26 NOTE — Anesthesia Preprocedure Evaluation (Signed)
Anesthesia Evaluation  Patient identified by MRN, date of birth, ID band Patient awake    Reviewed: Allergy & Precautions, H&P , NPO status , Patient's Chart, lab work & pertinent test results  History of Anesthesia Complications (+) PONV and history of anesthetic complications  Airway Mallampati: II  TM Distance: >3 FB Neck ROM: limited    Dental  (+) Teeth Intact, Dental Advidsory Given   Pulmonary neg pulmonary ROS, neg shortness of breath, neg COPD,    Pulmonary exam normal breath sounds clear to auscultation       Cardiovascular hypertension, (-) angina(-) Past MI and (-) Cardiac Stents Normal cardiovascular exam(-) dysrhythmias (-) Valvular Problems/Murmurs Rhythm:regular Rate:Normal     Neuro/Psych PSYCHIATRIC DISORDERS Anxiety Peripheral neuropathy - numbness b/l lower legs H/o spinal stenosis s/p laminectomy H/o Guillan-Barre Syndrome negative neurological ROS     GI/Hepatic Neg liver ROS, GERD  ,  Endo/Other  negative endocrine ROS  Renal/GU negative Renal ROS     Musculoskeletal   Abdominal   Peds  Hematology negative hematology ROS (+)   Anesthesia Other Findings Past Medical History: No date: Chest pain     Comment:  a. 10/2014 St Echo: nl LV fxn, no wma/ischemia. No date: Guillain Barr syndrome [facility]) 1980: Guillain Barr syndrome ([place]) No date: Hypertension No date: Spinal stenosis  Past Surgical History: No date: ABDOMINAL HYSTERECTOMY 2006: AUGMENTATION MAMMAPLASTY No date: BACK SURGERY 2016: CERVICAL SPINE SURGERY; N/A     Comment:  for myelopathy No date: LUMBAR DISC SURGERY  BMI    Body Mass Index: 24.80 kg/m      Reproductive/Obstetrics negative OB ROS                             Anesthesia Physical  Anesthesia Plan  ASA: II  Anesthesia Plan: General   Post-op Pain Management:    Induction: Intravenous  PONV Risk Score and Plan: Treatment may  vary due to age or medical condition, Propofol infusion and TIVA  Airway Management Planned: Natural Airway and Simple Face Mask  Additional Equipment:   Intra-op Plan:   Post-operative Plan:   Informed Consent: I have reviewed the patients History and Physical, chart, labs and discussed the procedure including the risks, benefits and alternatives for the proposed anesthesia with the patient or authorized representative who has indicated his/her understanding and acceptance.     Dental Advisory Given  Plan Discussed with: Anesthesiologist, CRNA and Surgeon  Anesthesia Plan Comments: (Discussed risks/benefits of spinal vs GETA.  Pt prefers to avoid spinal due to h/o significant neuropathy.  Will proceed with GETA)        Anesthesia Quick Evaluation

## 2020-04-26 NOTE — H&P (Signed)
Chief Complaint  Patient presents with  . Pelvis - Pain   [name] Woods is a 79 y.o. female who presents today for review of MRI results. She was found to have a sacral insufficiency fracture on MRI of her pelvis. On 04/08/2020 patient suffered a fall and developed severe debilitating pain. Patient has been in a rehab facility making very little progress. She is having a hard time walking and getting in the bed due to right sided sacral pain. She has been taking Tylenol and occasional tramadol with little relief. She has a hard time with sitting as well as with standing. Pain is severe along the right sacral body. She denies any numbness tingling or radicular symptoms. Patient also has imaging from the ER where CT scans obtained showing no evidence of acute fracture to the right hip, there is evidence of hardware from previous ORIF of the right hip. She has a nondisplaced right inferior pubic rami fracture.  Past Medical History: Past Medical History:  Diagnosis Date  . Anxiety  . Arthritis  . Cataract cortical, senile  . GERD without esophagitis 11/25/2015  . History of bone density study 08/20/06  . History of Guillain-Barre syndrome 1978  . History of migraine headaches  . Hypertension  . Neuropathy 2014  . Plantar fasciitis, right  . Postmenopausal   Past Surgical History: Past Surgical History:  Procedure Laterality Date  . APPENDECTOMY 1950  . CATARACT EXTRACTION 2016  . COLONOSCOPY 12/20/03  . FRACTURE SURGERY  Hip  . FUSION CERVICLE SPINE ANTERIOR 11/07/2014  and decompression  . history of breast implants  . HYSTERECTOMY 1988  for endometriosis  . TREATMENT HIP DISLOCATION W/ACETABULAR WALL/FEMORAL HEAD FRACTURE OPEN Right 11/11/2019  Dr. Rudene Christians   Past Family History: Family History  Problem Relation Age of Onset  . Dementia Mother  . Osteoarthritis Father  None   Medications: Current Outpatient Medications Ordered in Epic  Medication Sig Dispense Refill  .  acetaminophen (TYLENOL) 325 MG tablet Take by mouth  . carvediloL (COREG) 6.25 MG tablet Take 1 tablet (6.25 mg total) by mouth 2 (two) times daily for 180 days 60 tablet 5  . cholecalciferol (VITAMIN D3) 2,000 unit tablet Take 2,000 Units by mouth once daily  . docusate (COLACE) 100 MG capsule Take by mouth  . escitalopram oxalate (LEXAPRO) 10 MG tablet Take 1 tablet (10 mg total) by mouth once daily 90 tablet 3  . estradioL (ESTRACE) 0.5 MG tablet TAKE ONE TABLET EVERY DAY 90 tablet 1  . gabapentin (NEURONTIN) 100 MG capsule Take 1 capsule (100 mg total) by mouth nightly 90 capsule 3  . hydrocortisone 2.5 % cream APPLY TO AFFECTED AREA TWICE A DAY FOR 7DAYS 30 g 2  . losartan (COZAAR) 25 MG tablet Take 1 tablet (25 mg total) by mouth once daily 90 tablet 5  . MAGNESIUM ORAL Take 1 tablet by mouth once daily  . nitroGLYcerin (NITROSTAT) 0.4 MG SL tablet  . omeprazole (PRILOSEC) 40 MG DR capsule TAKE 1 CAPSULE EVERY DAY 90 capsule 1  . potassium 99 mg Tab Take 1 tablet by mouth once daily  . TORsemide (DEMADEX) 5 MG tablet TAKE 1 TABLET BY MOUTH DAILY 90 tablet 1  . traMADoL (ULTRAM) 50 mg tablet   No current Epic-ordered facility-administered medications on file.   Allergies: Allergies  Allergen Reactions  . Azithromycin Hives and Rash  . Amlodipine Swelling    Review of Systems:  A comprehensive 14 point ROS was performed, reviewed by  me today, and the pertinent orthopaedic findings are documented in the HPI.  Exam: BP 134/78  Ht 157.5 cm (5\' 2" )  Wt 61.2 kg (135 lb)  BMI 24.69 kg/m   HEENT: Head normocephalic, atraumatic, PERRL.   Abdomen: Soft, non tender, non distended, Bowel sounds present.  Heart: Examination of the heart reveals regular, rate, and rhythm. There is no murmur noted on ascultation. There is a normal apical pulse.  Lungs: Lungs are clear to auscultation. There is no wheeze, rhonchi, or crackles. There is normal expansion of bilateral chest walls.    General: Well developed, well nourished 79 y.o. female in no apparent distress. Normal affect. Normal communication. Patient answers questions appropriately. Patient presents in a wheelchair.  Lumbar Spine: Examination of the lumbar spine reveals no bony abnormality, no edema, and no ecchymosis. There is no step off. The patient has full range of motion of the lumbar spine with flexion and extension. The patient has normal lateral bend and rotation. The patient has no pain with range of motion activities. The patient has a negative axial load test, and a negative rotational Waddell test. The patient is non tender along the spinous process. The patient is non tender along the paravertebral muscles, with no muscle spasms. The patient is non tender along the iliac crest. The patient is non tender in the sciatic notch. Moderately tender along the right sacral body. No SI joint tenderness. Tender along the right inferior pubic ramus.  Bilateral Lower Extremities: Examination of the lower extremities reveals no bony abnormality, no edema, and no ecchymosis. The patient has full active and passive range of motion of the hips, knees, and ankles. There is no discomfort with range of motion exercises. The patient is non tender along the greater trochanter region. The patient has a negative Bevelyn Buckles' test bilaterally. There is normal skin warmth. There is normal capillary refill bilaterally.   Neurologic: The patient has a negative straight leg raise. The patient has normal muscle strength testing for the quadriceps, calves, ankle dorsiflexion, ankle plantarflexion, and extensor hallicus longus. The patient has sensation that is intact to light touch. The deep tendon reflexes are normal at the patella and achilles. No clonus is noted.   EXAM:  DG HIP (WITH OR WITHOUT PELVIS) 2-3V RIGHT   COMPARISON: 11/11/2019   FINDINGS:  Osteopenia. No displaced fracture or dislocation of the right hip or  pelvis. There  is screw fixation of a subcapitalfracture of the  right without evidence of perihardware fracture or lucency and  without obvious change in fracture alignment compared to prior  intraoperative fluoroscopic images. The included proximal left femur  is unremarkable in single frontal view.   IMPRESSION:  1. No displaced fracture or dislocation of the right hip or pelvis.  Please note that plain radiographs are limited in sensitivity for  fracture in the setting of osteopenia. Consider CT or MRI to most  sensitively assess for fracture.   2. There is screw fixation of a subcapital fracture of the right  without evidence of perihardware fracture or lucency and without  obvious change in fracture alignment compared to prior  intraoperative fluoroscopic images.   EXAM:  LUMBAR SPINE - 2-3 VIEW   COMPARISON: October 21, 2017.   FINDINGS:  Stable moderate compression deformity of L1 vertebral body is noted  consistent with old fracture. No acute fractureor spondylolisthesis  is noted. Severe degenerative disc disease is noted at L2-3, L3-4  and L4-5 and L5-S1.   IMPRESSION:  Severe multilevel  degenerative disc disease. Old L1 compression  fracture. No acute abnormality is noted.   Electronically Signed  By: Lupita Raider M.D.  On: 04/11/2020 13:23  CLINICAL DATA: Right hip pain since the patient suffered a fall  04/07/2020. History of prior right hip replacement.   EXAM:  MRI PELVIS WITHOUT CONTRAST   TECHNIQUE:  Multiplanar multisequence MR imaging of the pelvis was performed. No  intravenous contrast was administered.   COMPARISON: Plain films and CT right hip 04/11/2020.   FINDINGS:  Bones/Joint/Cartilage   Screws for fixation of a remote right subcapital hip fracture result  in artifact on the study. Again seen is a nondisplaced fracture of  the right inferior pubic ramus with associated marrow edema.  Although visualization is limited on axial imaging, there  appears to  be a nondisplaced high right superior pubic ramus fracture with  marrow edema. Also seen is a nondisplaced right sacral fracture with  associated marrow edema. No other fracture is identified.  Degenerative endplate signal change lower lumbar spine noted. The  right femoral head is obscured. No notable degenerative disease  about the left hip.   Ligaments   Negative.   Muscles and Tendons   Intact and normal in appearance.   Soft tissues   Imaged intrapelvic contents demonstrate sigmoid diverticulosis. The  patient is status post hysterectomy.   IMPRESSION:  Findings consistent with acute or subacute nondisplaced fractures of  the right sacrum, high right superior pubic ramus and right inferior  pubic ramus.   Status post fixation of a remote right subcapital hip fracture.   Lower lumbar degenerative disease which is incompletely evaluated on  this exam.   Sigmoid diverticulosis.   Electronically Signed  By: Drusilla Kanner M.D.  On: 04/19/2020 15:54  Impression: Sacral insufficiency fracture, initial encounter [H20.94BS] Sacral insufficiency fracture, initial encounter (primary encounter diagnosis) Sacral pain Inferior pubic ramus fracture, right, closed, initial encounter (CMS-HCC)  Plan:  39. 79 year old female with right inferior and superior pubic rami fracture. MRI showed sacral insufficiency fracture which seems to be causing her the most pain and limitations in her activities of daily living. Over the last few weeks she has made little to no progress with conservative treatment and physical therapy. She is still having a hard time ambulating and transitioning from bed to chair and chair to bed. Risks, benefits, complications of a sacral plasty procedure have been discussed with the patient. Patient has agreed and consented procedure with Dr. Kennedy Bucker on 04/26/2020. This note was generated in part with voice recognition software and I apologize  for any typographical errors that were not detected and corrected.  Patience Musca MPA-C    Electronically signed by Patience Musca, PA at 04/25/2020 9:09 AM EST   Reviewed  H+P. No changes noted.

## 2020-04-26 NOTE — Anesthesia Procedure Notes (Signed)
Procedure Name: MAC Date/Time: 04/26/2020 5:25 PM Performed by: Jerrye Noble, CRNA Pre-anesthesia Checklist: Patient identified, Emergency Drugs available, Suction available and Patient being monitored Patient Re-evaluated:Patient Re-evaluated prior to induction Oxygen Delivery Method: Nasal cannula

## 2020-04-27 ENCOUNTER — Encounter: Payer: Self-pay | Admitting: Orthopedic Surgery

## 2020-05-10 DIAGNOSIS — M8448XD Pathological fracture, other site, subsequent encounter for fracture with routine healing: Secondary | ICD-10-CM | POA: Diagnosis not present

## 2020-05-11 DIAGNOSIS — I10 Essential (primary) hypertension: Secondary | ICD-10-CM | POA: Diagnosis not present

## 2020-05-11 DIAGNOSIS — M8580 Other specified disorders of bone density and structure, unspecified site: Secondary | ICD-10-CM | POA: Diagnosis not present

## 2020-05-31 DIAGNOSIS — M8448XD Pathological fracture, other site, subsequent encounter for fracture with routine healing: Secondary | ICD-10-CM | POA: Diagnosis not present

## 2020-05-31 DIAGNOSIS — M4800 Spinal stenosis, site unspecified: Secondary | ICD-10-CM | POA: Diagnosis not present

## 2020-05-31 DIAGNOSIS — W19XXXD Unspecified fall, subsequent encounter: Secondary | ICD-10-CM | POA: Diagnosis not present

## 2020-05-31 DIAGNOSIS — R278 Other lack of coordination: Secondary | ICD-10-CM | POA: Diagnosis not present

## 2020-05-31 DIAGNOSIS — R2689 Other abnormalities of gait and mobility: Secondary | ICD-10-CM | POA: Diagnosis not present

## 2020-05-31 DIAGNOSIS — S32591D Other specified fracture of right pubis, subsequent encounter for fracture with routine healing: Secondary | ICD-10-CM | POA: Diagnosis not present

## 2020-05-31 DIAGNOSIS — M6281 Muscle weakness (generalized): Secondary | ICD-10-CM | POA: Diagnosis not present

## 2020-06-08 DIAGNOSIS — R2689 Other abnormalities of gait and mobility: Secondary | ICD-10-CM | POA: Diagnosis not present

## 2020-06-08 DIAGNOSIS — W19XXXD Unspecified fall, subsequent encounter: Secondary | ICD-10-CM | POA: Diagnosis not present

## 2020-06-08 DIAGNOSIS — M6281 Muscle weakness (generalized): Secondary | ICD-10-CM | POA: Diagnosis not present

## 2020-06-08 DIAGNOSIS — M8448XD Pathological fracture, other site, subsequent encounter for fracture with routine healing: Secondary | ICD-10-CM | POA: Diagnosis not present

## 2020-06-08 DIAGNOSIS — S32591D Other specified fracture of right pubis, subsequent encounter for fracture with routine healing: Secondary | ICD-10-CM | POA: Diagnosis not present

## 2020-06-08 DIAGNOSIS — M4800 Spinal stenosis, site unspecified: Secondary | ICD-10-CM | POA: Diagnosis not present

## 2020-06-08 DIAGNOSIS — R278 Other lack of coordination: Secondary | ICD-10-CM | POA: Diagnosis not present

## 2020-06-08 NOTE — Progress Notes (Signed)
Cardiology Office Note    Date:  06/09/2020   ID:  [name] Woods, [name] Woods Sep 15, 1940, MRN [id number]  PCP:  Kirk Ruths, MD  Cardiologist:  Ida Rogue, MD  Electrophysiologist:  None   Chief Complaint: Follow up   History of Present Illness:   [name] Woods is a 80 y.o. female with history of labile hypertension, Guillain-Barr syndrome, spinal stenosis, chest pain, and anxiety who presents for follow-up of hypertension.  She was previously followed by [facility] with a negative stress echo in 2016 with normal LV function.  She has a long history of labile hypertension which has generated anxiety previously.  She was seen in 07/2019 with well-controlled blood pressure and recommendation to take carvedilol 12.5 mg in the morning and 25 mg in the evening along with losartan 100 mg daily, hydralazine 50 mg twice daily, and torsemide 5 mg daily.  Amlodipine has previously been held secondary to lower extremity swelling.  We last saw her in 10/2019, at which time she was under increased stress with the unexpected passing of her husband.  With this, she noted some elevated BP and SOB, which she attributed to her stress/grief.  She was continued on Coreg 12.5 mg in the AM with 25 mg in the PM, losartan 100 mg, torsemide 5 mg, and hydralazine 50 mg bid with an extra 50 mg for systolic BP greater than 0000000 mmHg.  Further evaluation of her SOB was deferred at that time. She underwent hip surgery in the summer of 2021 with her BP medication being decreased postoperatively by outside facility. In this setting, she contacted our office and it was recommended she resume Coreg 12.5 mg in the AM with 25 mg in the PM, losartan 100 mg, and hydralazine prior to sleep. More recently, in 04/2020, she was noted to have sacral insufficiency by MRI after suffering a fall. She subsequently underwent surgical repair of this in late 04/2020. She was seen by her PCP on 05/11/2020, with a BP of  138/82. She was advised to take Coreg 6.25 mg bid. Their medication list indicated she was taking losartan 25 mg, torsemide 5 mg, and was not on hydralazine.    She comes in doing very well from a cardiac perspective.  No chest pain, dyspnea, palpitations, dizziness, presyncope, or syncope.  Current medication regimen includes carvedilol 6.25 mg twice daily, losartan 100 mg, and torsemide 5 mg.  She has not needed any SL NTG.  Morning blood pressures have been running in the 140s to low [id number] systolic prior to taking her medications.  Evening blood pressures are typically in the [id number] to [id number] systolic.  She has now exercising in the pool.  She does not have any issues or concerns at this time.   Labs independently reviewed: 04/2020 - HGB 13.9, PLT 418, potassium 4.0, BUN 11, SCr 0.53 11/2019 - albumin 4.3, AST/ALT normal 09/2019 - TC 224, TG 70, HDL 73, LDL 137 01/2018 - A1c 5.2, TSH normal  Past Medical History:  Diagnosis Date  . Arthritis   . Cancer ([place])    SKIN CA  . Chest pain    a. 10/2014 St Echo: nl LV fxn, no wma/ischemia.  Marland Kitchen GERD (gastroesophageal reflux disease)   . Guillain Barr syndrome ([place])   . Guillain Barr syndrome ([place]) 1980  . Hypertension   . PONV (postoperative nausea and vomiting)    H/O NAUSEA  . Spinal stenosis     Past Surgical History:  Procedure Laterality Date  .  ABDOMINAL HYSTERECTOMY    . AUGMENTATION MAMMAPLASTY  2006  . BACK SURGERY    . CERVICAL SPINE SURGERY N/A 2016   for myelopathy  . HIP PINNING,CANNULATED Right 11/11/2019   Procedure: CANNULATED HIP PINNING;  Surgeon: Hessie Knows, MD;  Location: [facility] ORS;  Service: Orthopedics;  Laterality: Right;  . LUMBAR DISC SURGERY    . SACROPLASTY Bilateral 04/26/2020   Procedure: SACROPLASTY;  Surgeon: Hessie Knows, MD;  Location: [facility] ORS;  Service: Orthopedics;  Laterality: Bilateral;    Current Medications: Current Meds  Medication Sig  . acetaminophen (TYLENOL) 325 MG tablet Take 650 mg by  mouth daily.  . carvedilol (COREG) 6.25 MG tablet Take 6.25 mg by mouth 2 (two) times daily with a meal.  . escitalopram (LEXAPRO) 10 MG tablet Take 10 mg by mouth every morning.  Marland Kitchen estradiol (ESTRACE) 1 MG tablet Take 1 mg by mouth daily.  Marland Kitchen gabapentin (NEURONTIN) 100 MG capsule Take 100 mg by mouth at bedtime.  Marland Kitchen losartan (COZAAR) 100 MG tablet Take 1 tablet (100 mg total) by mouth daily.  . nitroGLYCERIN (NITROSTAT) 0.4 MG SL tablet Place 1 tablet (0.4 mg total) under the tongue every 5 (five) minutes as needed for chest pain.  Marland Kitchen omeprazole (PRILOSEC) 20 MG capsule Take 20 mg by mouth daily as needed.  Marland Kitchen omeprazole (PRILOSEC) 40 MG capsule Take 40 mg by mouth as needed.  Marland Kitchen tiZANidine (ZANAFLEX) 4 MG tablet Take 2 mg by mouth at bedtime.  . torsemide (DEMADEX) 5 MG tablet Take 5 mg by mouth daily.    Allergies:   Azithromycin and Amlodipine   Social History   Socioeconomic History  . Marital status: Widowed    Spouse name: Not on file  . Number of children: Not on file  . Years of education: Not on file  . Highest education level: Not on file  Occupational History  . Not on file  Tobacco Use  . Smoking status: Never Smoker  . Smokeless tobacco: Never Used  Vaping Use  . Vaping Use: Never used  Substance and Sexual Activity  . Alcohol use: Yes    Alcohol/week: 1.0 standard drink    Types: 1 Glasses of wine per week    Comment:  3/week  . Drug use: No  . Sexual activity: Not on file  Other Topics Concern  . Not on file  Social History Narrative  . Not on file   Social Determinants of Health   Financial Resource Strain: Not on file  Food Insecurity: Not on file  Transportation Needs: Not on file  Physical Activity: Not on file  Stress: Not on file  Social Connections: Not on file     Family History:  The patient's family history includes Hypertension in her father and mother. There is no history of Breast cancer.  ROS:   Review of Systems  Constitutional:  Negative for chills, diaphoresis, fever, malaise/fatigue and weight loss.  HENT: Negative for congestion.   Eyes: Negative for discharge and redness.  Respiratory: Negative for cough, sputum production, shortness of breath and wheezing.   Cardiovascular: Negative for chest pain, palpitations, orthopnea, claudication, leg swelling and PND.  Gastrointestinal: Negative for abdominal pain, heartburn, nausea and vomiting.  Musculoskeletal: Negative for falls and myalgias.  Skin: Negative for rash.  Neurological: Negative for dizziness, tingling, tremors, sensory change, speech change, focal weakness, loss of consciousness and weakness.  Endo/Heme/Allergies: Does not bruise/bleed easily.  Psychiatric/Behavioral: Negative for substance abuse. The patient is not nervous/anxious.  All other systems reviewed and are negative.    EKGs/Labs/Other Studies Reviewed:    Studies reviewed were summarized above. The additional studies were reviewed today: As above.   EKG:  EKG is ordered today.  The EKG ordered today demonstrates NSR, 73 bpm, no acute ST-T changes  Recent Labs: 11/10/2019: ALT 17 04/26/2020: BUN 11; Creatinine, Ser 0.53; Hemoglobin 13.9; Platelets 418; Potassium 4.0; Sodium 128  Recent Lipid Panel No results found for: CHOL, TRIG, HDL, CHOLHDL, VLDL, LDLCALC, LDLDIRECT  PHYSICAL EXAM:    VS:  BP 140/80 (BP Location: Right Arm, Patient Position: Sitting, Cuff Size: Normal)   Pulse 73   Ht 5\' 1"  (1.549 m)   Wt 136 lb (61.7 kg)   SpO2 98%   BMI 25.70 kg/m   BMI: Body mass index is 25.7 kg/m.  Physical Exam Vitals reviewed.  Constitutional:      Appearance: She is well-developed and well-nourished.  HENT:     Head: Normocephalic and atraumatic.  Eyes:     General:        Right eye: No discharge.        Left eye: No discharge.  Neck:     Vascular: No JVD.  Cardiovascular:     Rate and Rhythm: Normal rate and regular rhythm.     Pulses: No midsystolic click and no opening  snap.          Posterior tibial pulses are 2+ on the right side and 2+ on the left side.     Heart sounds: Normal heart sounds, S1 normal and S2 normal. Heart sounds not distant. No murmur heard. No friction rub.  Pulmonary:     Effort: Pulmonary effort is normal. No respiratory distress.     Breath sounds: Normal breath sounds. No decreased breath sounds, wheezing or rales.  Chest:     Chest wall: No tenderness.  Abdominal:     General: There is no distension.     Palpations: Abdomen is soft.     Tenderness: There is no abdominal tenderness.  Musculoskeletal:        General: No edema.     Cervical back: Normal range of motion.  Skin:    General: Skin is warm and dry.     Nails: There is no clubbing or cyanosis.  Neurological:     Mental Status: She is alert and oriented to person, place, and time.  Psychiatric:        Mood and Affect: Mood and affect normal.        Speech: Speech normal.        Behavior: Behavior normal.        Thought Content: Thought content normal.        Judgment: Judgment normal.     Wt Readings from Last 3 Encounters:  06/09/20 136 lb (61.7 kg)  04/26/20 135 lb (61.2 kg)  04/11/20 134 lb (60.8 kg)     ASSESSMENT & PLAN:   1. Labile hypertension: Blood pressure readings are overall well controlled.  Continue carvedilol 6.25 mg twice daily, losartan 100 mg, and torsemide 5 mg.  If she notes her morning blood pressures start to trend upwards she can contact our office and we may be able to increase her carvedilol slightly.  2. Atypical chest pain: No further issues.  Disposition: F/u with Dr. Rockey Situ or an APP in 6 months.   Medication Adjustments/Labs and Tests Ordered: Current medicines are reviewed at length with the patient today.  Concerns regarding medicines  are outlined above. Medication changes, Labs and Tests ordered today are summarized above and listed in the Patient Instructions accessible in Encounters.   Signed, Christell Faith,  PA-C 06/09/2020 10:55 AM     [facility] [address] [place] [address] [place], [place] [address] [phone]

## 2020-06-09 ENCOUNTER — Ambulatory Visit: Payer: PPO | Admitting: Physician Assistant

## 2020-06-09 ENCOUNTER — Other Ambulatory Visit: Payer: Self-pay

## 2020-06-09 ENCOUNTER — Encounter: Payer: Self-pay | Admitting: Physician Assistant

## 2020-06-09 VITALS — BP 140/80 | HR 73 | Ht 61.0 in | Wt 136.0 lb

## 2020-06-09 DIAGNOSIS — R0989 Other specified symptoms and signs involving the circulatory and respiratory systems: Secondary | ICD-10-CM | POA: Diagnosis not present

## 2020-06-09 DIAGNOSIS — R0789 Other chest pain: Secondary | ICD-10-CM

## 2020-06-09 NOTE — Patient Instructions (Signed)
Medication Instructions:  No changes  *If you need a refill on your cardiac medications before your next appointment, please call your pharmacy*   Lab Work: None  If you have labs (blood work) drawn today and your tests are completely normal, you will receive your results only by: . MyChart Message (if you have MyChart) OR . A paper copy in the mail If you have any lab test that is abnormal or we need to change your treatment, we will call you to review the results.   Testing/Procedures: None   Follow-Up: At [facility], you and your health needs are our priority.  As part of our continuing mission to provide you with exceptional heart care, we have created designated Provider Care Teams.  These Care Teams include your primary Cardiologist (physician) and Advanced Practice Providers (APPs -  Physician Assistants and Nurse Practitioners) who all work together to provide you with the care you need, when you need it.   Your next appointment:   6 month(s)  The format for your next appointment:   In Person  Provider:   Timothy Gollan, MD or Ryan Dunn, PA-C  

## 2020-07-06 DIAGNOSIS — I10 Essential (primary) hypertension: Secondary | ICD-10-CM | POA: Diagnosis not present

## 2020-07-08 DIAGNOSIS — M8448XD Pathological fracture, other site, subsequent encounter for fracture with routine healing: Secondary | ICD-10-CM | POA: Diagnosis not present

## 2020-07-08 DIAGNOSIS — W19XXXD Unspecified fall, subsequent encounter: Secondary | ICD-10-CM | POA: Diagnosis not present

## 2020-07-08 DIAGNOSIS — M6281 Muscle weakness (generalized): Secondary | ICD-10-CM | POA: Diagnosis not present

## 2020-07-08 DIAGNOSIS — M4800 Spinal stenosis, site unspecified: Secondary | ICD-10-CM | POA: Diagnosis not present

## 2020-07-08 DIAGNOSIS — R278 Other lack of coordination: Secondary | ICD-10-CM | POA: Diagnosis not present

## 2020-07-08 DIAGNOSIS — R2689 Other abnormalities of gait and mobility: Secondary | ICD-10-CM | POA: Diagnosis not present

## 2020-07-08 DIAGNOSIS — S32591D Other specified fracture of right pubis, subsequent encounter for fracture with routine healing: Secondary | ICD-10-CM | POA: Diagnosis not present

## 2020-07-11 DIAGNOSIS — H26493 Other secondary cataract, bilateral: Secondary | ICD-10-CM | POA: Diagnosis not present

## 2020-08-09 DIAGNOSIS — R2689 Other abnormalities of gait and mobility: Secondary | ICD-10-CM | POA: Diagnosis not present

## 2020-08-09 DIAGNOSIS — M6281 Muscle weakness (generalized): Secondary | ICD-10-CM | POA: Diagnosis not present

## 2020-08-09 DIAGNOSIS — W19XXXD Unspecified fall, subsequent encounter: Secondary | ICD-10-CM | POA: Diagnosis not present

## 2020-08-09 DIAGNOSIS — S32591D Other specified fracture of right pubis, subsequent encounter for fracture with routine healing: Secondary | ICD-10-CM | POA: Diagnosis not present

## 2020-08-09 DIAGNOSIS — R278 Other lack of coordination: Secondary | ICD-10-CM | POA: Diagnosis not present

## 2020-08-09 DIAGNOSIS — M8448XD Pathological fracture, other site, subsequent encounter for fracture with routine healing: Secondary | ICD-10-CM | POA: Diagnosis not present

## 2020-08-09 DIAGNOSIS — M4800 Spinal stenosis, site unspecified: Secondary | ICD-10-CM | POA: Diagnosis not present

## 2020-08-16 DIAGNOSIS — M1711 Unilateral primary osteoarthritis, right knee: Secondary | ICD-10-CM | POA: Diagnosis not present

## 2020-08-16 DIAGNOSIS — M1712 Unilateral primary osteoarthritis, left knee: Secondary | ICD-10-CM | POA: Diagnosis not present

## 2020-09-05 ENCOUNTER — Other Ambulatory Visit: Payer: Self-pay | Admitting: Internal Medicine

## 2020-09-05 DIAGNOSIS — Z1231 Encounter for screening mammogram for malignant neoplasm of breast: Secondary | ICD-10-CM

## 2020-09-07 ENCOUNTER — Other Ambulatory Visit: Payer: Self-pay

## 2020-09-07 ENCOUNTER — Encounter: Payer: Self-pay | Admitting: Podiatry

## 2020-09-07 ENCOUNTER — Ambulatory Visit: Payer: PPO | Admitting: Podiatry

## 2020-09-07 DIAGNOSIS — L600 Ingrowing nail: Secondary | ICD-10-CM | POA: Diagnosis not present

## 2020-09-07 MED ORDER — NEOMYCIN-POLYMYXIN-HC 1 % OT SOLN: OTIC | 1 refills | Status: DC

## 2020-09-07 NOTE — Patient Instructions (Signed)

## 2020-09-07 NOTE — Progress Notes (Signed)
Subjective:  Patient ID: [name] Woods, female    DOB: 1941-03-29,  MRN: [id number] HPI No chief complaint on file.   80 y.o. female presents with the above complaint.   ROS: Denies fever chills nausea vomiting muscle aches pains calf pain back pain chest pain shortness of breath.  Past Medical History:  Diagnosis Date  . Arthritis   . Cancer ([place])    SKIN CA  . Chest pain    a. 10/2014 St Echo: nl LV fxn, no wma/ischemia.  Marland Kitchen GERD (gastroesophageal reflux disease)   . Guillain Barr syndrome ([place])   . Guillain Barr syndrome ([place]) 1980  . Hypertension   . PONV (postoperative nausea and vomiting)    H/O NAUSEA  . Spinal stenosis    Past Surgical History:  Procedure Laterality Date  . ABDOMINAL HYSTERECTOMY    . AUGMENTATION MAMMAPLASTY  2006  . BACK SURGERY    . CERVICAL SPINE SURGERY N/A 2016   for myelopathy  . HIP PINNING,CANNULATED Right 11/11/2019   Procedure: CANNULATED HIP PINNING;  Surgeon: Hessie Knows, MD;  Location: [facility] ORS;  Service: Orthopedics;  Laterality: Right;  . LUMBAR DISC SURGERY    . SACROPLASTY Bilateral 04/26/2020   Procedure: SACROPLASTY;  Surgeon: Hessie Knows, MD;  Location: [facility] ORS;  Service: Orthopedics;  Laterality: Bilateral;    Current Outpatient Medications:  .  acetaminophen (TYLENOL) 325 MG tablet, Take 650 mg by mouth daily., Disp: , Rfl:  .  carvedilol (COREG) 6.25 MG tablet, Take 6.25 mg by mouth 2 (two) times daily with a meal., Disp: , Rfl:  .  escitalopram (LEXAPRO) 10 MG tablet, Take 10 mg by mouth every morning., Disp: , Rfl:  .  estradiol (ESTRACE) 0.5 MG tablet, Take 0.5 mg by mouth daily., Disp: , Rfl:  .  gabapentin (NEURONTIN) 100 MG capsule, Take 100 mg by mouth at bedtime., Disp: , Rfl:  .  losartan (COZAAR) 100 MG tablet, Take 1 tablet (100 mg total) by mouth daily., Disp: 90 tablet, Rfl: 3 .  nitroGLYCERIN (NITROSTAT) 0.4 MG SL tablet, Place 1 tablet (0.4 mg total) under the tongue every 5 (five) minutes as  needed for chest pain., Disp: 25 tablet, Rfl: 3 .  omeprazole (PRILOSEC) 20 MG capsule, Take 20 mg by mouth daily as needed., Disp: , Rfl:  .  omeprazole (PRILOSEC) 40 MG capsule, Take 40 mg by mouth as needed., Disp: , Rfl:  .  tiZANidine (ZANAFLEX) 4 MG tablet, Take 2 mg by mouth at bedtime., Disp: , Rfl:  .  torsemide (DEMADEX) 5 MG tablet, Take 5 mg by mouth daily., Disp: , Rfl:   Allergies  Allergen Reactions  . Azithromycin Hives and Rash  . Prednisone     Other reaction(s): Other (See Comments)  . Amlodipine Swelling   Review of Systems Objective:  There were no vitals filed for this visit.  General: Well developed, nourished, in no acute distress, alert and oriented x3   Dermatological: Skin is warm, dry and supple bilateral. Nails x 10 are well maintained; sharp incurvated nail margin along the fibular border of the hallux left.  Otherwise the remainder of the nails are thick and dystrophic.  Remaining integument appears unremarkable at this time. There are no open sores, no preulcerative lesions, no rash or signs of infection present.  Vascular: Dorsalis Pedis artery and Posterior Tibial artery pedal pulses are 2/4 bilateral with immedate capillary fill time. Pedal hair growth present. No varicosities and no lower extremity edema present bilateral.  Neruologic: Grossly intact via light touch bilateral. Vibratory intact via tuning fork bilateral. Protective threshold with Semmes Wienstein monofilament intact to all pedal sites bilateral. Patellar and Achilles deep tendon reflexes 2+ bilateral. No Babinski or clonus noted bilateral.   Musculoskeletal: No gross boney pedal deformities bilateral. No pain, crepitus, or limitation noted with foot and ankle range of motion bilateral. Muscular strength 5/5 in all groups tested bilateral.  Gait: Unassisted, Nonantalgic.    Radiographs:  None taken  Assessment & Plan:   Assessment: Ingrown toenail fibular border hallux left.   Nail dystrophy remainder of the nails bilaterally cannot rule out onychomycosis.  Plan: Performed chemical matricectomy fibular border of the hallux left tolerated procedure well without complications.  Provided her with both oral and home-going instruction for the care and soaking of the toes as well as a prescription for Cortisporin Otic to be applied twice daily after soaking.  Debrided the remainder of the nails today for her.  Follow-up with her in 2 weeks for recheck.     [name] T. [place], [place]

## 2020-09-13 ENCOUNTER — Other Ambulatory Visit: Payer: Self-pay

## 2020-09-13 ENCOUNTER — Ambulatory Visit
Admission: RE | Admit: 2020-09-13 | Discharge: 2020-09-13 | Disposition: A | Discharge status: Home / Self Care | Payer: PPO | Source: Ambulatory Visit | Attending: Internal Medicine | Admitting: Internal Medicine

## 2020-09-13 DIAGNOSIS — Z1231 Encounter for screening mammogram for malignant neoplasm of breast: Secondary | ICD-10-CM | POA: Diagnosis not present

## 2020-09-16 DIAGNOSIS — M1711 Unilateral primary osteoarthritis, right knee: Secondary | ICD-10-CM | POA: Diagnosis not present

## 2020-09-20 DIAGNOSIS — I10 Essential (primary) hypertension: Secondary | ICD-10-CM | POA: Diagnosis not present

## 2020-09-21 ENCOUNTER — Other Ambulatory Visit: Payer: Self-pay

## 2020-09-21 ENCOUNTER — Encounter: Payer: Self-pay | Admitting: Podiatry

## 2020-09-21 ENCOUNTER — Ambulatory Visit: Payer: PPO | Admitting: Podiatry

## 2020-09-21 DIAGNOSIS — L03032 Cellulitis of left toe: Secondary | ICD-10-CM | POA: Diagnosis not present

## 2020-09-21 MED ORDER — DOXYCYCLINE HYCLATE 100 MG PO TABS: 100.0000 mg | ORAL_TABLET | Freq: Two times a day (BID) | ORAL | 0 refills | Status: DC

## 2020-09-21 NOTE — Progress Notes (Signed)
She presents today for a nail follow-up matrixectomy performed a few weeks ago.  States has been swollen red and sore for the past 2 to 3 days continues her soaks and Betadine and warm water.  Objective: Vital signs are stable alert oriented x3 hallux left does demonstrate lateral border mild erythema no purulence no malodor.  Tender to the touch.  Assessment: Mild paronychia status post matrixectomy hallux left.  Plan: At this point discontinue Betadine and warm water start with Epson salts and warm water cover during the day leave open at bedtime.  Also prescribed doxycycline we will follow-up with her in 2 weeks.

## 2020-09-26 DIAGNOSIS — I10 Essential (primary) hypertension: Secondary | ICD-10-CM | POA: Diagnosis not present

## 2020-09-26 DIAGNOSIS — G992 Myelopathy in diseases classified elsewhere: Secondary | ICD-10-CM | POA: Diagnosis not present

## 2020-09-26 DIAGNOSIS — F411 Generalized anxiety disorder: Secondary | ICD-10-CM | POA: Diagnosis not present

## 2020-09-26 DIAGNOSIS — Z9989 Dependence on other enabling machines and devices: Secondary | ICD-10-CM | POA: Diagnosis not present

## 2020-09-26 DIAGNOSIS — G603 Idiopathic progressive neuropathy: Secondary | ICD-10-CM | POA: Diagnosis not present

## 2020-09-26 DIAGNOSIS — M4802 Spinal stenosis, cervical region: Secondary | ICD-10-CM | POA: Diagnosis not present

## 2020-10-04 DIAGNOSIS — R2689 Other abnormalities of gait and mobility: Secondary | ICD-10-CM | POA: Diagnosis not present

## 2020-10-04 DIAGNOSIS — M1711 Unilateral primary osteoarthritis, right knee: Secondary | ICD-10-CM | POA: Diagnosis not present

## 2020-10-04 DIAGNOSIS — M6281 Muscle weakness (generalized): Secondary | ICD-10-CM | POA: Diagnosis not present

## 2020-10-05 ENCOUNTER — Ambulatory Visit: Payer: PPO | Admitting: Podiatry

## 2020-10-07 ENCOUNTER — Other Ambulatory Visit: Payer: Self-pay | Admitting: Orthopedic Surgery

## 2020-10-12 DIAGNOSIS — Z85828 Personal history of other malignant neoplasm of skin: Secondary | ICD-10-CM | POA: Diagnosis not present

## 2020-10-12 DIAGNOSIS — D2262 Melanocytic nevi of left upper limb, including shoulder: Secondary | ICD-10-CM | POA: Diagnosis not present

## 2020-10-12 DIAGNOSIS — D692 Other nonthrombocytopenic purpura: Secondary | ICD-10-CM | POA: Diagnosis not present

## 2020-10-12 DIAGNOSIS — D225 Melanocytic nevi of trunk: Secondary | ICD-10-CM | POA: Diagnosis not present

## 2020-10-12 DIAGNOSIS — D2261 Melanocytic nevi of right upper limb, including shoulder: Secondary | ICD-10-CM | POA: Diagnosis not present

## 2020-10-12 DIAGNOSIS — Z08 Encounter for follow-up examination after completed treatment for malignant neoplasm: Secondary | ICD-10-CM | POA: Diagnosis not present

## 2020-10-12 DIAGNOSIS — L821 Other seborrheic keratosis: Secondary | ICD-10-CM | POA: Diagnosis not present

## 2020-10-17 DIAGNOSIS — M1711 Unilateral primary osteoarthritis, right knee: Secondary | ICD-10-CM | POA: Diagnosis not present

## 2020-10-17 DIAGNOSIS — R2689 Other abnormalities of gait and mobility: Secondary | ICD-10-CM | POA: Diagnosis not present

## 2020-10-17 DIAGNOSIS — M6281 Muscle weakness (generalized): Secondary | ICD-10-CM | POA: Diagnosis not present

## 2020-10-19 ENCOUNTER — Ambulatory Visit: Payer: PPO | Admitting: Podiatry

## 2020-10-19 ENCOUNTER — Other Ambulatory Visit: Payer: Self-pay

## 2020-10-19 ENCOUNTER — Encounter: Payer: Self-pay | Admitting: Podiatry

## 2020-10-19 DIAGNOSIS — B351 Tinea unguium: Secondary | ICD-10-CM | POA: Diagnosis not present

## 2020-10-19 DIAGNOSIS — M79676 Pain in unspecified toe(s): Secondary | ICD-10-CM

## 2020-10-19 NOTE — Progress Notes (Signed)
She presents today for follow-up of her paronychia states that is still kind of sore she refers to the fibular border of the hallux left.  She went to her dermatologist to check to see if the nail was okay.  Dermatologist recommended that she come see [place] because there is still some nail in her skin.  At this point she states that is still a little tender back here where it is red as she points to the proximal lateral nail fold and at the tip of the toe.  She is also wanting the rest of her nails trimmed.  Objective: Vital signs are stable she is alert and oriented x3.  Pulses are palpable.  Evaluation of the fibular border of the hallux left demonstrates only a small callus or residual scab along the lateral border of the nail.  Once this is removed you can tell that there is absolutely no border in the skin whatsoever.  The proximal nail fold is mildly erythematous but when evaluating it with a curette does not demonstrate a spicule or any purulence at this time.  Toenails are long thick yellow dystrophic-like mycotic painful palpation as well as debridement.  Assessment: Pain in limb secondary to onychomycosis questionable paronychia hallux left.  Plan: Debrided nails 1 through 5 bilaterally.  Follow-up with me if this paronychia does not resolve.

## 2020-10-26 ENCOUNTER — Other Ambulatory Visit
Admission: RE | Admit: 2020-10-26 | Discharge: 2020-10-26 | Disposition: A | Discharge status: Home / Self Care | Payer: PPO | Source: Ambulatory Visit | Attending: Orthopedic Surgery | Admitting: Orthopedic Surgery

## 2020-10-26 ENCOUNTER — Other Ambulatory Visit: Payer: Self-pay

## 2020-10-26 DIAGNOSIS — Z01818 Encounter for other preprocedural examination: Secondary | ICD-10-CM | POA: Diagnosis not present

## 2020-10-26 DIAGNOSIS — Z01810 Encounter for preprocedural cardiovascular examination: Secondary | ICD-10-CM | POA: Diagnosis not present

## 2020-10-26 HISTORY — DX: Anxiety disorder, unspecified: F41.9

## 2020-10-26 LAB — CBC WITH DIFFERENTIAL/PLATELET
Abs Immature Granulocytes: 0.03 10*3/uL (ref 0.00–0.07)
Basophils Absolute: 0.1 10*3/uL (ref 0.0–0.1)
Basophils Relative: 1 %
Eosinophils Absolute: 0.1 10*3/uL (ref 0.0–0.5)
Eosinophils Relative: 1 %
HCT: 40.7 % (ref 36.0–46.0)
Hemoglobin: 14.1 g/dL (ref 12.0–15.0)
Immature Granulocytes: 0 %
Lymphocytes Relative: 34 %
Lymphs Abs: 2.7 10*3/uL (ref 0.7–4.0)
MCH: 31.3 pg (ref 26.0–34.0)
MCHC: 34.6 g/dL (ref 30.0–36.0)
MCV: 90.4 fL (ref 80.0–100.0)
Monocytes Absolute: 0.7 10*3/uL (ref 0.1–1.0)
Monocytes Relative: 9 %
Neutro Abs: 4.3 10*3/uL (ref 1.7–7.7)
Neutrophils Relative %: 55 %
Platelets: 295 10*3/uL (ref 150–400)
RBC: 4.5 MIL/uL (ref 3.87–5.11)
RDW: 13 % (ref 11.5–15.5)
WBC: 7.8 10*3/uL (ref 4.0–10.5)
nRBC: 0 % (ref 0.0–0.2)

## 2020-10-26 LAB — COMPREHENSIVE METABOLIC PANEL
ALT: 18 U/L (ref 0–44)
AST: 24 U/L (ref 15–41)
Albumin: 4.1 g/dL (ref 3.5–5.0)
Alkaline Phosphatase: 66 U/L (ref 38–126)
Anion gap: 7 (ref 5–15)
BUN: 14 mg/dL (ref 8–23)
CO2: 28 mmol/L (ref 22–32)
Calcium: 9.3 mg/dL (ref 8.9–10.3)
Chloride: 94 mmol/L — ABNORMAL LOW (ref 98–111)
Creatinine, Ser: 0.57 mg/dL (ref 0.44–1.00)
GFR, Estimated: >60 mL/min (ref >60)
Glucose, Bld: 95 mg/dL (ref 70–99)
Potassium: 4.3 mmol/L (ref 3.5–5.1)
Sodium: 129 mmol/L — ABNORMAL LOW (ref 135–145)
Total Bilirubin: 0.8 mg/dL (ref 0.3–1.2)
Total Protein: 6.7 g/dL (ref 6.5–8.1)

## 2020-10-26 LAB — URINALYSIS, ROUTINE W REFLEX MICROSCOPIC
Bilirubin Urine: NEGATIVE
Glucose, UA: NEGATIVE mg/dL
Hgb urine dipstick: NEGATIVE
Ketones, ur: NEGATIVE mg/dL
Leukocytes,Ua: NEGATIVE
Nitrite: NEGATIVE
Protein, ur: NEGATIVE mg/dL
Specific Gravity, Urine: 1.013 (ref 1.005–1.030)
pH: 7 (ref 5.0–8.0)

## 2020-10-26 LAB — TYPE AND SCREEN
ABO/RH(D): A POS
Antibody Screen: NEGATIVE

## 2020-10-26 LAB — SURGICAL PCR SCREEN
MRSA, PCR: NEGATIVE
Staphylococcus aureus: NEGATIVE

## 2020-10-26 NOTE — Patient Instructions (Signed)
Your procedure is scheduled on: Tuesday November 08, 2020. Report to Day Surgery inside [facility] 2nd floor (stop by admissions desk first before getting on elevator) To find out your arrival time please call [phone] between 1PM - 3PM on Friday November 04, 2020.  Remember: Instructions that are not followed completely may result in serious medical risk,  up to and including death, or upon the discretion of your surgeon and anesthesiologist your  surgery may need to be rescheduled.     _X__ 1. Do not eat food after midnight the night before your procedure.                 No chewing gum or hard candies. You may drink clear liquids up to 2 hours                 before you are scheduled to arrive for your surgery- DO not drink clear                 liquids within 2 hours of the start of your surgery.                 Clear Liquids include:  water, apple juice without pulp, clear Gatorade, G2 or                  Gatorade Zero (avoid Red/Purple/Blue), Black Coffee or Tea (Do not add                 anything to coffee or tea).  __X__2.   Complete the "Ensure Clear Pre-surgery Clear Carbohydrate Drink" provided to you, 2 hours before arrival. **If you are diabetic you will be provided with an alternative drink, Gatorade Zero or G2.  __X__3.  On the morning of surgery brush your teeth with toothpaste and water, you                may rinse your mouth with mouthwash if you wish.  Do not swallow any toothpaste of mouthwash.     _X__ 4.  No Alcohol for 24 hours before or after surgery.   _X__ 5.  Do Not Smoke or use e-cigarettes For 24 Hours Prior to Your Surgery.                 Do not use any chewable tobacco products for at least 6 hours prior to                 Surgery.  _X__  6.  Do not use any recreational drugs (marijuana, cocaine, heroin, ecstasy, MDMA or other)                For at least one week prior to your surgery.  Combination of these drugs with anesthesia                 May have life threatening results.  __X__  7.  Notify your doctor if there is any change in your medical condition      (cold, fever, infections).     Do not wear jewelry, make-up, hairpins, clips or nail polish. Do not wear lotions, powders, or perfumes. You may wear deodorant. Do not shave 48 hours prior to surgery. Men may shave face and neck. Do not bring valuables to the hospital.    [facility] is not responsible for any belongings or valuables.  Contacts, dentures or bridgework may not be worn into surgery. Leave your suitcase in the car. After  surgery it may be brought to your room. For patients admitted to the hospital, discharge time is determined by your treatment team.   Patients discharged the day of surgery will not be allowed to drive home.   Make arrangements for someone to be with you for the first 24 hours of your Same Day Discharge.    Please read over the following fact sheets that you were given:   Total Joint Packet    __X__ Take these medicines the morning of surgery with A SIP OF WATER:    1. carvedilol (COREG) 12.5 MG  2. escitalopram (LEXAPRO) 10 MG   3. estradiol (ESTRACE) 0.5 MG  4. omeprazole (PRILOSEC) 20 MG   5.  6.  ____ Fleet Enema (as directed)   __X__ Use CHG Soap (or wipes) as directed  ____ Use Benzoyl Peroxide Gel as instructed  ____ Use inhalers on the day of surgery  ____ Stop metformin 2 days prior to surgery    ____ Take 1/2 of usual insulin dose the night before surgery. No insulin the morning          of surgery.   ____ Call your PCP, cardiologist, or Pulmonologist if taking Coumadin/Plavix/aspirin and ask when to stop before your surgery.   __X__ One Week prior to surgery- Stop Anti-inflammatories such as Ibuprofen, Aleve, Advil, Motrin, meloxicam (MOBIC), diclofenac, etodolac, ketorolac, Toradol, Daypro, piroxicam, Goody's or BC powders. OK TO USE TYLENOL IF NEEDED   __X__ Stop supplements until after  surgery. Biotin 5 MG, vitamin D and vitamin B (cyanocobalamin)   ____ Bring C-Pap to the hospital.    If you have any questions regarding your pre-procedure instructions,  Please call Pre-admit Testing at [phone].

## 2020-11-04 ENCOUNTER — Other Ambulatory Visit: Payer: Self-pay

## 2020-11-04 ENCOUNTER — Other Ambulatory Visit
Admission: RE | Admit: 2020-11-04 | Discharge: 2020-11-04 | Disposition: A | Discharge status: Home / Self Care | Payer: PPO | Source: Ambulatory Visit | Attending: Orthopedic Surgery | Admitting: Orthopedic Surgery

## 2020-11-04 DIAGNOSIS — Z20822 Contact with and (suspected) exposure to covid-19: Secondary | ICD-10-CM | POA: Insufficient documentation

## 2020-11-04 DIAGNOSIS — Z01812 Encounter for preprocedural laboratory examination: Secondary | ICD-10-CM | POA: Diagnosis not present

## 2020-11-04 LAB — SARS CORONAVIRUS 2 (TAT 6-24 HRS): SARS Coronavirus 2: NEGATIVE

## 2020-11-07 MED ORDER — LACTATED RINGERS IV SOLN: INTRAVENOUS | Status: DC

## 2020-11-07 MED ORDER — CEFAZOLIN SODIUM-DEXTROSE 2-4 GM/100ML-% IV SOLN
2.0000 g | INTRAVENOUS | Status: AC
  Administered 2020-11-08: 2 g via INTRAVENOUS

## 2020-11-07 MED ORDER — CHLORHEXIDINE GLUCONATE 0.12 % MT SOLN: 15.0000 mL | Freq: Once | OROMUCOSAL | Status: AC

## 2020-11-07 MED ORDER — APREPITANT 40 MG PO CAPS: 40.0000 mg | ORAL_CAPSULE | Freq: Once | ORAL | Status: AC

## 2020-11-07 MED ORDER — ORAL CARE MOUTH RINSE: 15.0000 mL | Freq: Once | OROMUCOSAL | Status: AC

## 2020-11-08 ENCOUNTER — Other Ambulatory Visit: Payer: Self-pay

## 2020-11-08 ENCOUNTER — Inpatient Hospital Stay: Payer: PPO

## 2020-11-08 ENCOUNTER — Encounter
Admission: RE | Disposition: A | Discharge status: Skilled Nursing Facility | Payer: Self-pay | Source: Skilled Nursing Facility | Attending: Orthopedic Surgery

## 2020-11-08 ENCOUNTER — Inpatient Hospital Stay: Payer: PPO | Admitting: Anesthesiology

## 2020-11-08 ENCOUNTER — Encounter: Payer: Self-pay | Admitting: Orthopedic Surgery

## 2020-11-08 ENCOUNTER — Inpatient Hospital Stay
Admission: RE | Admit: 2020-11-08 | Discharge: 2020-11-11 | DRG: 470 | Disposition: A | Discharge status: Skilled Nursing Facility | Payer: PPO | Source: Skilled Nursing Facility | Attending: Orthopedic Surgery | Admitting: Orthopedic Surgery

## 2020-11-08 DIAGNOSIS — G610 Guillain-Barre syndrome: Secondary | ICD-10-CM | POA: Diagnosis present

## 2020-11-08 DIAGNOSIS — R279 Unspecified lack of coordination: Secondary | ICD-10-CM | POA: Diagnosis not present

## 2020-11-08 DIAGNOSIS — Z9849 Cataract extraction status, unspecified eye: Secondary | ICD-10-CM

## 2020-11-08 DIAGNOSIS — G5790 Unspecified mononeuropathy of unspecified lower limb: Secondary | ICD-10-CM | POA: Diagnosis present

## 2020-11-08 DIAGNOSIS — N959 Unspecified menopausal and perimenopausal disorder: Secondary | ICD-10-CM | POA: Diagnosis not present

## 2020-11-08 DIAGNOSIS — E871 Hypo-osmolality and hyponatremia: Secondary | ICD-10-CM | POA: Diagnosis not present

## 2020-11-08 DIAGNOSIS — Z741 Need for assistance with personal care: Secondary | ICD-10-CM | POA: Diagnosis not present

## 2020-11-08 DIAGNOSIS — F419 Anxiety disorder, unspecified: Secondary | ICD-10-CM | POA: Diagnosis present

## 2020-11-08 DIAGNOSIS — M4800 Spinal stenosis, site unspecified: Secondary | ICD-10-CM | POA: Diagnosis present

## 2020-11-08 DIAGNOSIS — W19XXXA Unspecified fall, initial encounter: Secondary | ICD-10-CM | POA: Diagnosis not present

## 2020-11-08 DIAGNOSIS — Z743 Need for continuous supervision: Secondary | ICD-10-CM | POA: Diagnosis not present

## 2020-11-08 DIAGNOSIS — G8918 Other acute postprocedural pain: Secondary | ICD-10-CM

## 2020-11-08 DIAGNOSIS — I10 Essential (primary) hypertension: Secondary | ICD-10-CM | POA: Diagnosis present

## 2020-11-08 DIAGNOSIS — Z20822 Contact with and (suspected) exposure to covid-19: Secondary | ICD-10-CM | POA: Diagnosis not present

## 2020-11-08 DIAGNOSIS — Z888 Allergy status to other drugs, medicaments and biological substances status: Secondary | ICD-10-CM

## 2020-11-08 DIAGNOSIS — M8580 Other specified disorders of bone density and structure, unspecified site: Secondary | ICD-10-CM | POA: Diagnosis not present

## 2020-11-08 DIAGNOSIS — M6281 Muscle weakness (generalized): Secondary | ICD-10-CM | POA: Diagnosis not present

## 2020-11-08 DIAGNOSIS — F411 Generalized anxiety disorder: Secondary | ICD-10-CM | POA: Diagnosis not present

## 2020-11-08 DIAGNOSIS — R278 Other lack of coordination: Secondary | ICD-10-CM | POA: Diagnosis not present

## 2020-11-08 DIAGNOSIS — R2681 Unsteadiness on feet: Secondary | ICD-10-CM | POA: Diagnosis not present

## 2020-11-08 DIAGNOSIS — R609 Edema, unspecified: Secondary | ICD-10-CM | POA: Diagnosis not present

## 2020-11-08 DIAGNOSIS — K219 Gastro-esophageal reflux disease without esophagitis: Secondary | ICD-10-CM | POA: Diagnosis present

## 2020-11-08 DIAGNOSIS — Z4789 Encounter for other orthopedic aftercare: Secondary | ICD-10-CM | POA: Diagnosis not present

## 2020-11-08 DIAGNOSIS — Z79899 Other long term (current) drug therapy: Secondary | ICD-10-CM

## 2020-11-08 DIAGNOSIS — I959 Hypotension, unspecified: Secondary | ICD-10-CM | POA: Diagnosis not present

## 2020-11-08 DIAGNOSIS — M25561 Pain in right knee: Secondary | ICD-10-CM | POA: Diagnosis present

## 2020-11-08 DIAGNOSIS — Z471 Aftercare following joint replacement surgery: Secondary | ICD-10-CM | POA: Diagnosis not present

## 2020-11-08 DIAGNOSIS — M722 Plantar fascial fibromatosis: Secondary | ICD-10-CM | POA: Diagnosis not present

## 2020-11-08 DIAGNOSIS — G2581 Restless legs syndrome: Secondary | ICD-10-CM | POA: Diagnosis not present

## 2020-11-08 DIAGNOSIS — Z96651 Presence of right artificial knee joint: Secondary | ICD-10-CM

## 2020-11-08 DIAGNOSIS — M1711 Unilateral primary osteoarthritis, right knee: Secondary | ICD-10-CM | POA: Diagnosis not present

## 2020-11-08 DIAGNOSIS — G4709 Other insomnia: Secondary | ICD-10-CM | POA: Diagnosis not present

## 2020-11-08 DIAGNOSIS — I2510 Atherosclerotic heart disease of native coronary artery without angina pectoris: Secondary | ICD-10-CM | POA: Diagnosis not present

## 2020-11-08 DIAGNOSIS — F339 Major depressive disorder, recurrent, unspecified: Secondary | ICD-10-CM | POA: Diagnosis not present

## 2020-11-08 DIAGNOSIS — Z981 Arthrodesis status: Secondary | ICD-10-CM | POA: Diagnosis not present

## 2020-11-08 DIAGNOSIS — R2689 Other abnormalities of gait and mobility: Secondary | ICD-10-CM | POA: Diagnosis not present

## 2020-11-08 DIAGNOSIS — D62 Acute posthemorrhagic anemia: Secondary | ICD-10-CM | POA: Diagnosis not present

## 2020-11-08 DIAGNOSIS — M48062 Spinal stenosis, lumbar region with neurogenic claudication: Secondary | ICD-10-CM | POA: Diagnosis not present

## 2020-11-08 HISTORY — PX: TOTAL KNEE ARTHROPLASTY: SHX125

## 2020-11-08 LAB — CBC
HCT: 34.5 % — ABNORMAL LOW (ref 36.0–46.0)
Hemoglobin: 11.8 g/dL — ABNORMAL LOW (ref 12.0–15.0)
MCH: 31.1 pg (ref 26.0–34.0)
MCHC: 34.2 g/dL (ref 30.0–36.0)
MCV: 90.8 fL (ref 80.0–100.0)
Platelets: 277 10*3/uL (ref 150–400)
RBC: 3.8 MIL/uL — ABNORMAL LOW (ref 3.87–5.11)
RDW: 12.8 % (ref 11.5–15.5)
WBC: 17.5 10*3/uL — ABNORMAL HIGH (ref 4.0–10.5)
nRBC: 0 % (ref 0.0–0.2)

## 2020-11-08 LAB — CREATININE, SERUM
Creatinine, Ser: 0.6 mg/dL (ref 0.44–1.00)
GFR, Estimated: >60 mL/min (ref >60)

## 2020-11-08 SURGERY — ARTHROPLASTY, KNEE, TOTAL
Anesthesia: Spinal | Site: Knee | Laterality: Right

## 2020-11-08 MED ORDER — MIDAZOLAM HCL 2 MG/2ML IJ SOLN
INTRAMUSCULAR | Status: AC
  Filled 2020-11-08: qty 2

## 2020-11-08 MED ORDER — SODIUM CHLORIDE 0.9 % IV BOLUS
500.0000 mL | Freq: Once | INTRAVENOUS | Status: AC
  Administered 2020-11-08: 500 mL via INTRAVENOUS

## 2020-11-08 MED ORDER — NITROGLYCERIN 0.4 MG SL SUBL: 0.4000 mg | SUBLINGUAL_TABLET | SUBLINGUAL | Status: DC | PRN

## 2020-11-08 MED ORDER — ESTRADIOL 1 MG PO TABS
0.5000 mg | ORAL_TABLET | Freq: Every day | ORAL | Status: DC
  Administered 2020-11-09 – 2020-11-11 (×3): 0.5 mg via ORAL
  Filled 2020-11-08 (×3): qty 0.5

## 2020-11-08 MED ORDER — MIDAZOLAM HCL 5 MG/5ML IJ SOLN
INTRAMUSCULAR | Status: DC | PRN
  Administered 2020-11-08 (×2): 1 mg via INTRAVENOUS

## 2020-11-08 MED ORDER — METHOCARBAMOL 500 MG PO TABS
500.0000 mg | ORAL_TABLET | Freq: Four times a day (QID) | ORAL | Status: DC | PRN
  Administered 2020-11-08 – 2020-11-10 (×4): 500 mg via ORAL
  Filled 2020-11-08 (×4): qty 1

## 2020-11-08 MED ORDER — SODIUM CHLORIDE 0.9 % IR SOLN
Status: DC | PRN
  Administered 2020-11-08: 3000 mL

## 2020-11-08 MED ORDER — VITAMIN D 25 MCG (1000 UNIT) PO TABS
5000.0000 [IU] | ORAL_TABLET | Freq: Every day | ORAL | Status: DC
  Administered 2020-11-09 – 2020-11-11 (×3): 5000 [IU] via ORAL
  Filled 2020-11-08 (×4): qty 5

## 2020-11-08 MED ORDER — HYDROCODONE-ACETAMINOPHEN 5-325 MG PO TABS
1.0000 | ORAL_TABLET | ORAL | Status: DC | PRN
  Administered 2020-11-08: 1 via ORAL
  Administered 2020-11-09: 2 via ORAL
  Administered 2020-11-10: 1 via ORAL
  Administered 2020-11-11: 2 via ORAL
  Administered 2020-11-11: 1 via ORAL
  Filled 2020-11-08 (×2): qty 1
  Filled 2020-11-08: qty 2
  Filled 2020-11-08 (×2): qty 1

## 2020-11-08 MED ORDER — GABAPENTIN 300 MG PO CAPS
300.0000 mg | ORAL_CAPSULE | Freq: Every day | ORAL | Status: DC
  Administered 2020-11-08 – 2020-11-10 (×3): 300 mg via ORAL
  Filled 2020-11-08 (×3): qty 1

## 2020-11-08 MED ORDER — ONDANSETRON HCL 4 MG/2ML IJ SOLN: 4.0000 mg | Freq: Four times a day (QID) | INTRAMUSCULAR | Status: DC | PRN

## 2020-11-08 MED ORDER — PHENYLEPHRINE HCL (PRESSORS) 10 MG/ML IV SOLN
INTRAVENOUS | Status: AC
  Filled 2020-11-08: qty 1

## 2020-11-08 MED ORDER — ONDANSETRON HCL 4 MG PO TABS: 4.0000 mg | ORAL_TABLET | Freq: Four times a day (QID) | ORAL | Status: DC | PRN

## 2020-11-08 MED ORDER — BISACODYL 10 MG RE SUPP: 10.0000 mg | Freq: Every day | RECTAL | Status: DC | PRN

## 2020-11-08 MED ORDER — BUPIVACAINE-EPINEPHRINE (PF) 0.25% -1:200000 IJ SOLN
INTRAMUSCULAR | Status: AC
  Filled 2020-11-08: qty 30

## 2020-11-08 MED ORDER — NEOMYCIN-POLYMYXIN B GU 40-200000 IR SOLN
Status: DC | PRN
  Administered 2020-11-08: 16 mL

## 2020-11-08 MED ORDER — DOCUSATE SODIUM 100 MG PO CAPS
100.0000 mg | ORAL_CAPSULE | Freq: Two times a day (BID) | ORAL | Status: DC
  Administered 2020-11-08 – 2020-11-11 (×7): 100 mg via ORAL
  Filled 2020-11-08 (×7): qty 1

## 2020-11-08 MED ORDER — BUPIVACAINE-EPINEPHRINE (PF) 0.25% -1:200000 IJ SOLN
INTRAMUSCULAR | Status: DC | PRN
  Administered 2020-11-08: 30 mL

## 2020-11-08 MED ORDER — MAGNESIUM CITRATE PO SOLN
1.0000 | Freq: Once | ORAL | Status: DC | PRN
  Filled 2020-11-08: qty 296

## 2020-11-08 MED ORDER — CARVEDILOL 12.5 MG PO TABS
12.5000 mg | ORAL_TABLET | Freq: Two times a day (BID) | ORAL | Status: DC
  Administered 2020-11-08 – 2020-11-11 (×5): 12.5 mg via ORAL
  Filled 2020-11-08 (×6): qty 1

## 2020-11-08 MED ORDER — TORSEMIDE 10 MG PO TABS
5.0000 mg | ORAL_TABLET | Freq: Every day | ORAL | Status: DC
  Administered 2020-11-08 – 2020-11-11 (×4): 5 mg via ORAL
  Filled 2020-11-08 (×4): qty 0.5

## 2020-11-08 MED ORDER — LOSARTAN POTASSIUM 25 MG PO TABS
25.0000 mg | ORAL_TABLET | Freq: Every day | ORAL | Status: DC
  Administered 2020-11-10: 25 mg via ORAL
  Filled 2020-11-08 (×2): qty 1

## 2020-11-08 MED ORDER — SURGIPHOR WOUND IRRIGATION SYSTEM - OPTIME
TOPICAL | Status: DC | PRN
  Administered 2020-11-08: 1 via TOPICAL

## 2020-11-08 MED ORDER — APREPITANT 40 MG PO CAPS
ORAL_CAPSULE | ORAL | Status: AC
  Administered 2020-11-08: 40 mg via ORAL
  Filled 2020-11-08: qty 1

## 2020-11-08 MED ORDER — METOCLOPRAMIDE HCL 5 MG/ML IJ SOLN
5.0000 mg | Freq: Three times a day (TID) | INTRAMUSCULAR | Status: DC | PRN
Start: 2020-11-08 — End: 2020-11-11

## 2020-11-08 MED ORDER — ENOXAPARIN SODIUM 30 MG/0.3ML IJ SOSY
30.0000 mg | PREFILLED_SYRINGE | Freq: Two times a day (BID) | INTRAMUSCULAR | Status: DC
  Administered 2020-11-09 – 2020-11-11 (×5): 30 mg via SUBCUTANEOUS
  Filled 2020-11-08 (×5): qty 0.3

## 2020-11-08 MED ORDER — PROPOFOL 500 MG/50ML IV EMUL
INTRAVENOUS | Status: DC | PRN
Start: 2020-11-08 — End: 2020-11-08
  Administered 2020-11-08: 200 ug/kg/min via INTRAVENOUS

## 2020-11-08 MED ORDER — CEFAZOLIN SODIUM-DEXTROSE 2-4 GM/100ML-% IV SOLN
INTRAVENOUS | Status: AC
  Filled 2020-11-08: qty 100

## 2020-11-08 MED ORDER — METHOCARBAMOL 1000 MG/10ML IJ SOLN
500.0000 mg | Freq: Four times a day (QID) | INTRAVENOUS | Status: DC | PRN
  Filled 2020-11-08: qty 5

## 2020-11-08 MED ORDER — SODIUM CHLORIDE 0.9 % IV SOLN: INTRAVENOUS | Status: DC

## 2020-11-08 MED ORDER — ESCITALOPRAM OXALATE 10 MG PO TABS
10.0000 mg | ORAL_TABLET | Freq: Every day | ORAL | Status: DC
  Administered 2020-11-09 – 2020-11-11 (×3): 10 mg via ORAL
  Filled 2020-11-08 (×3): qty 1

## 2020-11-08 MED ORDER — FENTANYL CITRATE (PF) 100 MCG/2ML IJ SOLN: 25.0000 ug | INTRAMUSCULAR | Status: DC | PRN

## 2020-11-08 MED ORDER — HYDROCODONE-ACETAMINOPHEN 5-325 MG PO TABS
ORAL_TABLET | ORAL | Status: AC
  Filled 2020-11-08: qty 1

## 2020-11-08 MED ORDER — DIPHENHYDRAMINE HCL 25 MG PO CAPS: 25.0000 mg | ORAL_CAPSULE | Freq: Every evening | ORAL | Status: DC | PRN

## 2020-11-08 MED ORDER — PHENOL 1.4 % MT LIQD
1.0000 | OROMUCOSAL | Status: DC | PRN
  Filled 2020-11-08: qty 177

## 2020-11-08 MED ORDER — MAGNESIUM OXIDE -MG SUPPLEMENT 400 (240 MG) MG PO TABS
200.0000 mg | ORAL_TABLET | Freq: Every day | ORAL | Status: DC
  Administered 2020-11-09 – 2020-11-11 (×3): 200 mg via ORAL
  Filled 2020-11-08 (×3): qty 1

## 2020-11-08 MED ORDER — MORPHINE SULFATE (PF) 10 MG/ML IV SOLN
INTRAVENOUS | Status: AC
  Filled 2020-11-08: qty 1

## 2020-11-08 MED ORDER — SODIUM CHLORIDE (PF) 0.9 % IJ SOLN
INTRAMUSCULAR | Status: AC
  Filled 2020-11-08: qty 50

## 2020-11-08 MED ORDER — ROPINIROLE HCL 0.25 MG PO TABS
0.2500 mg | ORAL_TABLET | Freq: Every day | ORAL | Status: DC
  Administered 2020-11-08 – 2020-11-10 (×3): 0.25 mg via ORAL
  Filled 2020-11-08 (×4): qty 1

## 2020-11-08 MED ORDER — DIPHENHYDRAMINE-APAP (SLEEP) 25-500 MG PO TABS: 1.0000 | ORAL_TABLET | Freq: Every evening | ORAL | Status: DC | PRN

## 2020-11-08 MED ORDER — DIPHENHYDRAMINE HCL 12.5 MG/5ML PO ELIX: 12.5000 mg | ORAL_SOLUTION | ORAL | Status: DC | PRN

## 2020-11-08 MED ORDER — SODIUM CHLORIDE 0.9 % IV SOLN
INTRAVENOUS | Status: DC | PRN
  Administered 2020-11-08: 60 mL

## 2020-11-08 MED ORDER — MENTHOL 3 MG MT LOZG
1.0000 | LOZENGE | OROMUCOSAL | Status: DC | PRN
  Filled 2020-11-08: qty 9

## 2020-11-08 MED ORDER — CEFAZOLIN SODIUM-DEXTROSE 1-4 GM/50ML-% IV SOLN
1.0000 g | Freq: Four times a day (QID) | INTRAVENOUS | Status: AC
  Administered 2020-11-08 (×2): 1 g via INTRAVENOUS
  Filled 2020-11-08 (×2): qty 50

## 2020-11-08 MED ORDER — CHLORHEXIDINE GLUCONATE 0.12 % MT SOLN
OROMUCOSAL | Status: AC
  Administered 2020-11-08: 15 mL via OROMUCOSAL
  Filled 2020-11-08: qty 15

## 2020-11-08 MED ORDER — BUPIVACAINE HCL (PF) 0.5 % IJ SOLN
INTRAMUSCULAR | Status: DC | PRN
  Administered 2020-11-08: 2.8 mL

## 2020-11-08 MED ORDER — POLYETHYLENE GLYCOL 3350 17 G PO PACK
17.0000 g | PACK | Freq: Every day | ORAL | Status: DC | PRN
  Administered 2020-11-10: 17 g via ORAL
  Filled 2020-11-08: qty 1

## 2020-11-08 MED ORDER — BUPIVACAINE LIPOSOME 1.3 % IJ SUSP
INTRAMUSCULAR | Status: AC
  Filled 2020-11-08: qty 20

## 2020-11-08 MED ORDER — PROPOFOL 10 MG/ML IV BOLUS: INTRAVENOUS | Status: DC | PRN

## 2020-11-08 MED ORDER — PANTOPRAZOLE SODIUM 40 MG PO TBEC
80.0000 mg | DELAYED_RELEASE_TABLET | Freq: Every day | ORAL | Status: DC
  Administered 2020-11-09 – 2020-11-11 (×3): 80 mg via ORAL
  Filled 2020-11-08 (×4): qty 2

## 2020-11-08 MED ORDER — METOCLOPRAMIDE HCL 10 MG PO TABS
5.0000 mg | ORAL_TABLET | Freq: Three times a day (TID) | ORAL | Status: DC | PRN
Start: 2020-11-08 — End: 2020-11-11

## 2020-11-08 MED ORDER — TRAMADOL HCL 50 MG PO TABS
50.0000 mg | ORAL_TABLET | Freq: Four times a day (QID) | ORAL | Status: DC
  Administered 2020-11-08 – 2020-11-11 (×10): 50 mg via ORAL
  Filled 2020-11-08 (×11): qty 1

## 2020-11-08 MED ORDER — ACETAMINOPHEN 500 MG PO TABS
500.0000 mg | ORAL_TABLET | Freq: Every evening | ORAL | Status: DC | PRN
  Administered 2020-11-08: 500 mg via ORAL
  Filled 2020-11-08: qty 1

## 2020-11-08 MED ORDER — ACETAMINOPHEN 325 MG PO TABS: 325.0000 mg | ORAL_TABLET | Freq: Four times a day (QID) | ORAL | Status: DC | PRN

## 2020-11-08 MED ORDER — VITAMIN B-12 1000 MCG PO TABS
2000.0000 ug | ORAL_TABLET | Freq: Every day | ORAL | Status: DC
  Administered 2020-11-09 – 2020-11-11 (×3): 2000 ug via ORAL
  Filled 2020-11-08 (×3): qty 2

## 2020-11-08 MED ORDER — MORPHINE SULFATE (PF) 2 MG/ML IV SOLN
0.5000 mg | INTRAVENOUS | Status: DC | PRN
Start: 2020-11-08 — End: 2020-11-11

## 2020-11-08 MED ORDER — TIZANIDINE HCL 2 MG PO TABS
2.0000 mg | ORAL_TABLET | Freq: Every day | ORAL | Status: DC
  Administered 2020-11-08 – 2020-11-10 (×3): 2 mg via ORAL
  Filled 2020-11-08 (×4): qty 1

## 2020-11-08 MED ORDER — 0.9 % SODIUM CHLORIDE (POUR BTL) OPTIME
TOPICAL | Status: DC | PRN
  Administered 2020-11-08: 1000 mL

## 2020-11-08 MED ORDER — HYDROCODONE-ACETAMINOPHEN 7.5-325 MG PO TABS: 1.0000 | ORAL_TABLET | ORAL | Status: DC | PRN

## 2020-11-08 MED ORDER — MORPHINE SULFATE (PF) 10 MG/ML IV SOLN
INTRAVENOUS | Status: DC | PRN
  Administered 2020-11-08: 10 mg

## 2020-11-08 MED ORDER — SODIUM CHLORIDE 0.9 % IV SOLN
INTRAVENOUS | Status: DC | PRN
  Administered 2020-11-08: 75 ug/min via INTRAVENOUS

## 2020-11-08 MED ORDER — ALUM & MAG HYDROXIDE-SIMETH 200-200-20 MG/5ML PO SUSP
30.0000 mL | ORAL | Status: DC | PRN
  Administered 2020-11-09: 30 mL via ORAL
  Filled 2020-11-08: qty 30

## 2020-11-08 MED ORDER — PROPOFOL 1000 MG/100ML IV EMUL
INTRAVENOUS | Status: AC
  Filled 2020-11-08: qty 100

## 2020-11-08 SURGICAL SUPPLY — 69 items
BLADE SAGITTAL 25.0X1.19X90 (BLADE) ×2 IMPLANT
BLADE SAW 90X13X1.19 OSCILLAT (BLADE) ×2 IMPLANT
BNDG ELASTIC 6X5.8 VLCR STR LF (GAUZE/BANDAGES/DRESSINGS) ×2 IMPLANT
CANISTER SUCT 1200ML W/VALVE (MISCELLANEOUS) ×2 IMPLANT
CANISTER WOUND CARE 500ML ATS (WOUND CARE) ×2 IMPLANT
CEMENT HV SMART SET (Cement) ×4 IMPLANT
CHLORAPREP W/TINT 26 (MISCELLANEOUS) ×4 IMPLANT
COOLER POLAR GLACIER W/PUMP (MISCELLANEOUS) ×2 IMPLANT
CUFF TOURN SGL QUICK 24 (TOURNIQUET CUFF)
CUFF TOURN SGL QUICK 34 (TOURNIQUET CUFF)
CUFF TRNQT CYL 24X4X16.5-23 (TOURNIQUET CUFF) IMPLANT
CUFF TRNQT CYL 34X4.125X (TOURNIQUET CUFF) IMPLANT
DRAPE 3/4 80X56 (DRAPES) ×4 IMPLANT
DRSG MEPILEX SACRM 8.7X9.8 (GAUZE/BANDAGES/DRESSINGS) ×2 IMPLANT
ELECT CAUTERY BLADE 6.4 (BLADE) ×2 IMPLANT
ELECT REM PT RETURN 9FT ADLT (ELECTROSURGICAL) ×2
ELECTRODE REM PT RTRN 9FT ADLT (ELECTROSURGICAL) ×1 IMPLANT
FEMORAL COMPONENT PS R S4 (Femur) ×2 IMPLANT
GAUZE 4X4 16PLY ~~LOC~~+RFID DBL (SPONGE) ×2 IMPLANT
GAUZE SPONGE 4X4 12PLY STRL (GAUZE/BANDAGES/DRESSINGS) ×2 IMPLANT
GAUZE XEROFORM 1X8 LF (GAUZE/BANDAGES/DRESSINGS) ×2 IMPLANT
GLOVE SURG ORTHO LTX SZ8 (GLOVE) ×2 IMPLANT
GLOVE SURG SYN 9.0  PF PI (GLOVE) ×1
GLOVE SURG SYN 9.0 PF PI (GLOVE) ×1 IMPLANT
GLOVE SURG UNDER LTX SZ8 (GLOVE) ×2 IMPLANT
GLOVE SURG UNDER POLY LF SZ9 (GLOVE) ×2 IMPLANT
GOWN SRG 2XL LVL 4 RGLN SLV (GOWNS) ×1 IMPLANT
GOWN STRL NON-REIN 2XL LVL4 (GOWNS) ×1
GOWN STRL REUS W/ TWL LRG LVL3 (GOWN DISPOSABLE) ×1 IMPLANT
GOWN STRL REUS W/ TWL XL LVL3 (GOWN DISPOSABLE) ×1 IMPLANT
GOWN STRL REUS W/TWL LRG LVL3 (GOWN DISPOSABLE) ×1
GOWN STRL REUS W/TWL XL LVL3 (GOWN DISPOSABLE) ×1
HOLDER FOLEY CATH W/STRAP (MISCELLANEOUS) ×2 IMPLANT
HOOD PEEL AWAY FLYTE STAYCOOL (MISCELLANEOUS) ×4 IMPLANT
IRRIGATION SURGIPHOR STRL (IV SOLUTION) IMPLANT
IV NS IRRIG 3000ML ARTHROMATIC (IV SOLUTION) ×2 IMPLANT
KIT PREVENA INCISION MGT20CM45 (CANNISTER) ×2 IMPLANT
KIT TURNOVER KIT A (KITS) ×2 IMPLANT
KNEE TIBIAL INSERT FXD 10MM S4 (Insert) ×2 IMPLANT
KNEE TIBIAL INSERT FXD 20MM S4 (Insert) ×2 IMPLANT
MANIFOLD NEPTUNE II (INSTRUMENTS) ×2 IMPLANT
NDL SAFETY ECLIPSE 18X1.5 (NEEDLE) ×1 IMPLANT
NEEDLE HYPO 18GX1.5 SHARP (NEEDLE) ×1
NEEDLE SPNL 18GX3.5 QUINCKE PK (NEEDLE) ×2 IMPLANT
NEEDLE SPNL 20GX3.5 QUINCKE YW (NEEDLE) ×2 IMPLANT
NS IRRIG 1000ML POUR BTL (IV SOLUTION) ×2 IMPLANT
PACK TOTAL KNEE (MISCELLANEOUS) ×2 IMPLANT
PAD WRAPON POLAR KNEE (MISCELLANEOUS) ×1 IMPLANT
PATELLA RESURFACING MEDACTA 02 (Bone Implant) ×2 IMPLANT
PENCIL SMOKE EVACUATOR COATED (MISCELLANEOUS) ×2 IMPLANT
PULSAVAC PLUS IRRIG FAN TIP (DISPOSABLE) ×2
SCALPEL PROTECTED #10 DISP (BLADE) ×4 IMPLANT
SPONGE T-LAP 18X18 ~~LOC~~+RFID (SPONGE) ×6 IMPLANT
STAPLER SKIN PROX 35W (STAPLE) ×2 IMPLANT
STEM EXTENSION 11MMX30MM (Stem) ×2 IMPLANT
SUCTION FRAZIER HANDLE 10FR (MISCELLANEOUS) ×1
SUCTION TUBE FRAZIER 10FR DISP (MISCELLANEOUS) ×1 IMPLANT
SUT DVC 2 QUILL PDO  T11 36X36 (SUTURE) ×1
SUT DVC 2 QUILL PDO T11 36X36 (SUTURE) ×1 IMPLANT
SUT ETHIBOND 2 V 37 (SUTURE) IMPLANT
SUT V-LOC 90 ABS DVC 3-0 CL (SUTURE) ×2 IMPLANT
SYR 20ML LL LF (SYRINGE) ×2 IMPLANT
SYR 50ML LL SCALE MARK (SYRINGE) ×4 IMPLANT
TIBIAL TRAY FIXED 4 MEDACTA 02 (Joint) ×2 IMPLANT
TIP FAN IRRIG PULSAVAC PLUS (DISPOSABLE) ×1 IMPLANT
TOWEL OR 17X26 4PK STRL BLUE (TOWEL DISPOSABLE) ×2 IMPLANT
TOWER CARTRIDGE SMART MIX (DISPOSABLE) ×2 IMPLANT
TRAY FOLEY MTR SLVR 16FR STAT (SET/KITS/TRAYS/PACK) ×2 IMPLANT
WRAPON POLAR PAD KNEE (MISCELLANEOUS) ×2

## 2020-11-08 NOTE — Discharge Instructions (Addendum)

## 2020-11-08 NOTE — Anesthesia Procedure Notes (Signed)
Spinal  Patient location during procedure: OR Start time: 11/08/2020 7:25 AM End time: 11/08/2020 7:38 AM Reason for block: surgical anesthesia Staffing Performed: resident/CRNA  Resident/CRNA: Nelda Marseille, CRNA Preanesthetic Checklist Completed: patient identified, IV checked, site marked, risks and benefits discussed, surgical consent, monitors and equipment checked, pre-op evaluation and timeout performed Spinal Block Patient position: sitting Prep: ChloraPrep and site prepped and draped Patient monitoring: heart rate, continuous pulse ox, blood pressure and cardiac monitor Approach: right paramedian Location: L3-4 Injection technique: single-shot Needle Needle type: Quincke  Needle gauge: 22 G Needle length: 9 cm Assessment Sensory level: T10 Events: CSF return Additional Notes Negative paresthesia. Negative blood return. Positive free-flowing CSF. Expiration date of kit checked and confirmed. Patient tolerated procedure well, without complications.

## 2020-11-08 NOTE — Op Note (Signed)
11/08/2020  9:53 AM  PATIENT:  [name] Woods  80 y.o. female  PRE-OPERATIVE DIAGNOSIS:  Primary osteoarthritis of right knee M17.11  POST-OPERATIVE DIAGNOSIS:  Primary osteoarthritis of right knee M17.11  PROCEDURE:  Procedure(s): TOTAL KNEE ARTHROPLASTY-RNFA (Right)  SURGEON: Laurene Footman, MD  ASSISTANTS: Olean Ree Silloman, RNFA  ANESTHESIA:   spinal  EBL:  Total I/O In: 100 [IV Piggyback:100] Out: -   BLOOD ADMINISTERED:none  DRAINS:  Incisional wound VAC    LOCAL MEDICATIONS USED:  MARCAINE    and OTHER Exparel and morphine  SPECIMEN:  No Specimen  DISPOSITION OF SPECIMEN:  N/A  COUNTS:  YES  TOURNIQUET:   Total Tourniquet Time Documented: Thigh (Right) - 72 minutes Total: Thigh (Right) - 72 minutes   IMPLANTS: Medacta GMK PS for right femur, 4 right tibia with short stem and 10 mm PS insert 2 patella all components cemented  DICTATION: .Dragon Dictation patient was brought to the operating room and after adequate anesthesia was obtained the right leg was prepped and draped in usual sterile fashion.  After patient identification and timeout procedures were completed tourniquet was raised and a midline skin incision was made followed by medial parapatellar arthrotomy.  Inspection revealed eroded lateral compartment with exposed bone on the femoral and tibial sides as well as exposed bone on the femoral trochlea and patella with mild medial compartment degenerative change.  Anterior horns of the menisci excised all the fat pad and extra medullary tibial alignment guide for proximal tibial resection carried out.  Next the distal femoral drill hole was made and an intramedullary distal cut made 6 degrees of valgus.  Spacer gauge checked and there is adequate resection at this point.  The sizing guide gauge was then utilized and anterior posterior chamfer cuts made after sizing the femur to a 4.  The posterior horns of the menisci excised at this time and the  proximal tibia also sized to a 4 this was pinned in place and proximal tibial preparation carried out followed by femoral trial.  A 10 mm PS insert gave excellent stability and there was adequate bone stability that revision implants ended up not being required although they were available.  The femoral trochlear groove cut was made along with PX drill cut on the distal femur followed by removal of these primary implants.  The above local was infiltrated throughout the joint at this point and then the patella cut using the patellar cutting guide drilled and measured to a size 2.  The bony surfaces were thoroughly irrigated and dried and the tibial baseplate impacted into position followed by the tibial insert followed by the femoral component and the knee held in extension as the cement set with patellar button clamped in place.  After this was completed and excess cement was removed after the cement was hardened the knee was irrigated with Betadine and then pulse lavage patella tracked well with no touch technique.  The arthrotomy was then repaired with a heavy Quill after laying down the tourniquet and a 3-0 Vicryl V-Loc U subcutaneously.  Skin staples were applied followed by an incisional wound VAC.  Polar Care applied along with Ace wrap.  PLAN OF CARE: Admit to inpatient   PATIENT DISPOSITION:  PACU - hemodynamically stable.

## 2020-11-08 NOTE — Transfer of Care (Signed)
Immediate Anesthesia Transfer of Care Note  Patient: [name] Woods  Procedure(s) Performed: TOTAL KNEE ARTHROPLASTY-RNFA (Right: Knee)  Patient Location: PACU  Anesthesia Type:Spinal  Level of Consciousness: awake, alert  and oriented  Airway & Oxygen Therapy: Patient Spontanous Breathing and Patient connected to face mask oxygen  Post-op Assessment: Report given to RN and Post -op Vital signs reviewed and stable  Post vital signs: Reviewed and stable  Last Vitals:  Vitals Value Taken Time  BP    Temp    Pulse 69 11/08/20 0950  Resp 12 11/08/20 0950  SpO2 92 % 11/08/20 0950  Vitals shown include unvalidated device data.  Last Pain:  Vitals:   11/08/20 0636  TempSrc: Tympanic  PainSc: 0-No pain         Complications: No notable events documented.

## 2020-11-08 NOTE — Evaluation (Signed)
Physical Therapy Evaluation Patient Details Name: [name] Woods MRN: [id number] DOB: 11-27-1940 Today's Date: 11/08/2020   History of Present Illness  80yo female s/p R TKA 11/08/20. PMHx includes anxiety, GERD, hx Guillian-Barre syndrome (1978), HTN, migraine headaches, hx cervical and lumbar spinal surgeries, and neuropathy.  Clinical Impression  Pt showed good effort but was pain limited as well as having lightheadedness (BP was low after standing/transfer to recliner).  She has h/o multiple medical issues that will make her rehab more difficult than average, however she did show great effort with all that she was able to do despite pain and other limitations.  She needed assist with mobility, standing, etc but again showed good effort; exercises were pain limited but relatively appropriate POD0.      Follow Up Recommendations SNF;Supervision for mobility/OOB    Equipment Recommendations  None recommended by PT (appears she has walker and BSC)    Recommendations for Other Services       Precautions / Restrictions Precautions Precautions: Fall;Knee Precaution Booklet Issued: Yes (comment) (HEP) Restrictions Weight Bearing Restrictions: Yes RLE Weight Bearing: Weight bearing as tolerated      Mobility  Bed Mobility Overal bed mobility: Needs Assistance Bed Mobility: Supine to Sit;Sit to Supine     Supine to sit: Min assist Sit to supine: Min assist   General bed mobility comments: Pt hesitant but with encouragement was able to help a good bit with these transitions    Transfers Overall transfer level: Needs assistance Equipment used: Rolling walker (2 wheeled) Transfers: Sit to/from Stand Sit to Stand: Min assist         General transfer comment: again pt with some hesitancy, but with encouragement and only light assist was able to attain standing with heavy UE/walker use  Ambulation/Gait Ambulation/Gait assistance: Mod assist Gait Distance (Feet): 3  Feet Assistive device: Rolling walker (2 wheeled)       General Gait Details: Pt able to take a few hesitant, shuffling steps to recliner and after rest break back to the bed.  Stairs            Wheelchair Mobility    Modified Rankin (Stroke Patients Only)       Balance                                             Pertinent Vitals/Pain Pain Assessment: 0-10 Pain Score: 7  Pain Location: reports more quad than incision pain, also with LE tenderness 2/2 restless leg/neuropathy Pain Descriptors / Indicators: Spasm;Sharp;Restless Pain Intervention(s): Limited activity within patient's tolerance;Monitored during session;Premedicated before session;Repositioned;Ice applied    Home Living Family/patient expects to be discharged to:: Skilled nursing facility                 Additional Comments: Pt reports plan to discharge to [facility] for short term rehab; Pt lives in [place] at baseline, 2 story cottage with level entry and can live on the main floor with walk-in shower and comfort height toilets. Has rollator, shower chair, HH showerhead, reacher, sock aide, BSC.    Prior Function Level of Independence: Independent with assistive device(s)         Comments: Pt amb with rollator, drives, indep with ADL, light meal prep, med mgt. No falls in 3mo     Hand Dominance   Dominant Hand: Right    Extremity/Trunk  Assessment   Upper Extremity Assessment Upper Extremity Assessment: Defer to OT evaluation    Lower Extremity Assessment Lower Extremity Assessment: Generalized weakness (expected post op weakness, limited active ankle AROM, pain hesitant with most acts on R) RLE Deficits / Details: expected post-op strength/ROM deficits RLE Sensation: history of peripheral neuropathy LLE Sensation: history of peripheral neuropathy    Cervical / Trunk Assessment Cervical / Trunk Assessment: Other exceptions Cervical / Trunk Exceptions: hx  cervical and lumbar spinal surgeries  Communication   Communication: No difficulties  Cognition Arousal/Alertness: Awake/alert Behavior During Therapy: WFL for tasks assessed/performed Overall Cognitive Status: Within Functional Limits for tasks assessed                                        General Comments General comments (skin integrity, edema, etc.): pt with some lightheadeness with mobility, BP was low 60s/40s, then 80s/60s after laying back in recliner for a few minutes before getting back to bed 2/2 safety    Exercises Total Joint Exercises Ankle Circles/Pumps: AAROM;10 reps Quad Sets: Strengthening;10 reps Short Arc Quad: AAROM;5 reps Heel Slides: AAROM;5 reps Hip ABduction/ADduction: AROM;5 reps Straight Leg Raises: PROM (pt unable to lift/tolerate) Knee Flexion: PROM;5 reps Goniometric ROM: 2-86 Other Exercises Other Exercises: Pt educated in compression stocking mgt, polar care mgt, falls prevention, RLE positioning in bed for optimal recovery and knee extension, AE/DME, and home/routines modifications to maximize safety/indep and return to PLOF. Handout provided to support recall and carryover.   Assessment/Plan    PT Assessment Patient needs continued PT services  PT Problem List Decreased strength;Decreased range of motion;Decreased activity tolerance;Decreased balance;Decreased mobility;Decreased cognition;Decreased knowledge of use of DME;Decreased safety awareness;Pain;Cardiopulmonary status limiting activity       PT Treatment Interventions DME instruction;Gait training;Stair training;Functional mobility training;Therapeutic activities;Therapeutic exercise;Cognitive remediation;Patient/family education;Balance training    PT Goals (Current goals can be found in the Care Plan section)  Acute Rehab PT Goals Patient Stated Goal: go to [facility] rehab to get stronger before going home alone PT Goal Formulation: With patient Time For Goal  Achievement: 11/22/20 Potential to Achieve Goals: Fair    Frequency BID   Barriers to discharge        Co-evaluation               AM-PAC PT "6 Clicks" Mobility  Outcome Measure Help needed turning from your back to your side while in a flat bed without using bedrails?: A Little Help needed moving from lying on your back to sitting on the side of a flat bed without using bedrails?: A Lot Help needed moving to and from a bed to a chair (including a wheelchair)?: A Lot Help needed standing up from a chair using your arms (e.g., wheelchair or bedside chair)?: A Little Help needed to walk in hospital room?: A Lot Help needed climbing 3-5 steps with a railing? : Total 6 Click Score: 13    End of Session Equipment Utilized During Treatment: Gait belt Activity Tolerance: Patient limited by pain;Treatment limited secondary to medical complications (Comment);Patient limited by fatigue Patient left: with bed alarm set;with call bell/phone within reach Nurse Communication: Mobility status (BP) PT Visit Diagnosis: Muscle weakness (generalized) (M62.81);Difficulty in walking, not elsewhere classified (R26.2)    Time: 5284-1324 PT Time Calculation (min) (ACUTE ONLY): 38 min   Charges:   PT Evaluation $PT Eval Low Complexity: 1 Low PT Treatments $Therapeutic  Exercise: 8-22 mins $Therapeutic Activity: 8-22 mins           Kreg Shropshire, DPT 11/08/2020, 5:37 PM

## 2020-11-08 NOTE — Plan of Care (Signed)
Patient alert and oriented. Wound vac, foley cath present.Locked knee out on bed. Will continue to monitor.

## 2020-11-08 NOTE — H&P (Signed)
Chief Complaint  Patient presents with   Right Knee - Pain   Reason for Visit [name] Woods is a 80 y.o. who presents today for history and physical. She is to undergo a right total knee arthroplasty on 11/08/2020. Was last seen in the clinic on 09/16/2020. There is been no change in her condition since that time. Patient is wishing to go to [facility]. She is currently a resident there. Due to medical issues she does not feel that she can go home following surgery.  Patient is noted to have bilateral knee pain with the right being worse than the left. Condition has been present for quite some time. She does have neuropathy of the lower extremity. Also has spinal stenosis. She uses a rolling walker for ambulation. States that her right foot turns out. States that her right knee has gotten much worse recently. She does live alone. She has had 1 injection with no improvement in her condition. Patient has also been doing aquatic therapy as well as strengthening of the lower extremity in preparation of having total knee arthroplasty. Patient states that her pain is increased to the point that is significantly interfering with her activities of daily living and she presents desiring to proceed with total knee arthroplasty.  Past Medical History Past Medical History:  Diagnosis Date   Anxiety   Arthritis   Cataract cortical, senile   GERD without esophagitis 11/25/2015   History of bone density study 08/20/06   History of Guillain-Barre syndrome 1978   History of migraine headaches   Hypertension   Neuropathy 2014   Plantar fasciitis, right   Postmenopausal   Past Surgical History Past Surgical History:  Procedure Laterality Date   APPENDECTOMY 1950   CATARACT EXTRACTION 2016   COLONOSCOPY 12/20/03   FRACTURE SURGERY  Hip   FUSION CERVICLE SPINE ANTERIOR 11/07/2014  and decompression   history of breast implants   HYSTERECTOMY 1988  for endometriosis   sacroplasty Bilateral 04/26/2020   Dr. Rudene Christians   TREATMENT HIP DISLOCATION W/ACETABULAR WALL/FEMORAL [place] Right 11/11/2019  Dr. Rudene Christians   Past Family History Family History  Problem Relation Age of Onset   Dementia Mother   Osteoarthritis Father  None   Medications Current Outpatient Medications Ordered in Epic  Medication Sig Dispense Refill   acetaminophen (TYLENOL) 325 MG tablet Take by mouth   carvediloL (COREG) 12.5 MG tablet Take 1 tablet (12.5 mg total) by mouth 2 (two) times daily with meals 180 tablet 11   cholecalciferol (VITAMIN D3) 2,000 unit tablet Take 2,000 Units by mouth once daily   escitalopram oxalate (LEXAPRO) 10 MG tablet Take 1 tablet (10 mg total) by mouth once daily 90 tablet 3   estradioL (ESTRACE) 0.5 MG tablet TAKE ONE TABLET EVERY DAY 90 tablet 1   gabapentin (NEURONTIN) 300 MG capsule Take 1 capsule (300 mg total) by mouth nightly 30 capsule 0   hydrocortisone 2.5 % cream APPLY TO AFFECTED AREA TWICE A DAY FOR 7DAYS 30 g 2   losartan (COZAAR) 25 MG tablet Take 1 tablet (25 mg total) by mouth once daily 90 tablet 5   MAGNESIUM ORAL Take 1 tablet by mouth once daily   nitroGLYcerin (NITROSTAT) 0.4 MG SL tablet   omeprazole (PRILOSEC) 20 MG DR capsule Take 1 capsule (20 mg total) by mouth once daily 90 capsule 3   potassium 99 mg Tab Take 1 tablet by mouth once daily   tiZANidine (ZANAFLEX) 2 MG tablet Take 1  tablet (2 mg total) by mouth every 8 (eight) hours as needed 90 tablet 11   TORsemide (DEMADEX) 5 MG tablet TAKE 1 TABLET BY MOUTH DAILY 90 tablet 1   No current Epic-ordered facility-administered medications on file.   Allergies Allergies  Allergen Reactions   Azithromycin Hives and Rash   Amlodipine Swelling   Prednisone Other (See Comments)    Review of Systems A comprehensive 14 point ROS was performed, reviewed, and the pertinent orthopaedic findings are documented in the HPI.  Exam BP 118/76 (BP Location: Left upper arm, Patient Position: Sitting, BP Cuff Size:  Adult)  Ht 157.5 cm (5\' 2" )  Wt 62.1 kg (136 lb 12.8 oz)  BMI 25.02 kg/m   General: Well-developed well-nourished female seen in no acute distress.   HEENT: Atraumatic,normocephalic. Pupils are equal and reactive to light. Oropharynx is clear with moist mucosa  Lungs: Clear to auscultation bilaterally   Cardiovascular: Regular rate and rhythm. Normal S1, S2. No murmurs. No appreciable gallops or rubs. Peripheral pulses are palpable.  Abdomen: Soft, non-tender, nondistended. Bowel sounds present  Extremity: Patient is noted to have increased valgus deformity. She is tender to palpation along the medial as well as lateral joint line. She lacks 10 degrees full extension with flexion being approximate 115 degrees. Valgus deformity is correctable. Does not appear to have any laxity to the cruciate ligaments.  Neurological:  The patient is alert and oriented Sensation to light touch appears to be intact and within normal limits Gross motor strength appeared to be equal to 5/5  Vascular :  Peripheral pulses felt to be palpable. Capillary refill appears to be intact and within normal limits  X-ray  X-rays taken in [place] clinic showed degenerative changes of the lateral compartment of the left knee with valgus deformity. Moderate sclerotic changes in the lateral compartment with 80% loss of joint space. Moderate sclerotic changes along the patellofemoral compartment.  Impression  1. Degenerative arthrosis right knee  Plan   1. Patient currently taking no anticoagulation medication that needs to be discontinued 2. Patient has stated earlier would like to go to [facility] rehab following surgery since she lives alone 3. Return to clinic 2 weeks postop. Sooner if any problems  This note was generated in part with voice recognition software and I apologize for any typographical errors that were not detected and corrected   Watt Climes PA Electronically signed by Regino Bellow, PA at 11/02/2020 2:33 PM EDT  Reviewed  H+P. No changes noted.

## 2020-11-08 NOTE — Anesthesia Preprocedure Evaluation (Signed)
Anesthesia Evaluation  Patient identified by MRN, date of birth, ID band Patient awake    Reviewed: Allergy & Precautions, H&P , NPO status , Patient's Chart, lab work & pertinent test results  History of Anesthesia Complications (+) PONV and history of anesthetic complications  Airway Mallampati: II  TM Distance: >3 FB Neck ROM: limited    Dental  (+) Teeth Intact, Dental Advidsory Given   Pulmonary neg pulmonary ROS, neg shortness of breath, neg COPD,    Pulmonary exam normal breath sounds clear to auscultation       Cardiovascular hypertension, (-) angina(-) Past MI and (-) Cardiac Stents Normal cardiovascular exam(-) dysrhythmias (-) Valvular Problems/Murmurs Rhythm:regular Rate:Normal     Neuro/Psych neg Seizures PSYCHIATRIC DISORDERS Anxiety Peripheral neuropathy - numbness b/l lower legs H/o spinal stenosis s/p laminectomy H/o Guillan-Barre Syndrome  Neuromuscular disease    GI/Hepatic Neg liver ROS, GERD  ,  Endo/Other  negative endocrine ROS  Renal/GU negative Renal ROS     Musculoskeletal   Abdominal   Peds  Hematology negative hematology ROS (+)   Anesthesia Other Findings Past Medical History: No date: Chest pain     Comment:  a. 10/2014 St Echo: nl LV fxn, no wma/ischemia. No date: Guillain Barr syndrome [facility]) 1980: Guillain Barr syndrome ([place]) No date: Hypertension No date: Spinal stenosis  Past Surgical History: No date: ABDOMINAL HYSTERECTOMY 2006: AUGMENTATION MAMMAPLASTY No date: BACK SURGERY 2016: CERVICAL SPINE SURGERY; N/A     Comment:  for myelopathy No date: LUMBAR DISC SURGERY  BMI    Body Mass Index: 24.80 kg/m      Reproductive/Obstetrics negative OB ROS                             Anesthesia Physical  Anesthesia Plan  ASA: 2  Anesthesia Plan: Spinal   Post-op Pain Management:    Induction: Intravenous  PONV Risk Score and Plan: 3 and  Treatment may vary due to age or medical condition, Propofol infusion and TIVA  Airway Management Planned: Natural Airway and Simple Face Mask  Additional Equipment:   Intra-op Plan:   Post-operative Plan:   Informed Consent: I have reviewed the patients History and Physical, chart, labs and discussed the procedure including the risks, benefits and alternatives for the proposed anesthesia with the patient or authorized representative who has indicated his/her understanding and acceptance.     Dental Advisory Given  Plan Discussed with: Anesthesiologist, CRNA and Surgeon  Anesthesia Plan Comments: (Discussed risks/benefits of spinal vs GETA.  Pt prefers to avoid spinal due to h/o significant neuropathy.  Will proceed with GETA)        Anesthesia Quick Evaluation

## 2020-11-08 NOTE — Evaluation (Signed)
Occupational Therapy Evaluation Patient Details Name: [name] Woods MRN: [id number] DOB: 1940-09-26 Today's Date: 11/08/2020    History of Present Illness 79yo female s/p R TKA 11/08/20. PMHx includes anxiety, GERD, hx Guillian-Barre syndrome (1978), HTN, migraine headaches, hx cervical and lumbar spinal surgeries, and neuropathy.   Clinical Impression   Pt seen for OT evaluation this date, POD#1 from above surgery. Pt was generally independent in all ADL prior to surgery, however requiring use of rollator for mobility due to R knee pain. Pt is eager to return to PLOF with less pain and improved safety and independence. Pt currently requires increased assist for LB dressing and dressing from bed level due to pain and limited AROM of R knee. Pt declined attempts to get EOB 2/2 pain/muscle spasms. OT verified pt had recently received meds to address. Pt educated in compression stocking mgt, polar care mgt, falls prevention, RLE positioning in bed for optimal recovery and knee extension, AE/DME, and home/routines modifications to maximize safety/indep and return to PLOF. Pt verbalized understanding. Handout provided to support recall and carryover. Pt would benefit from skilled OT services including additional instruction in dressing techniques with or without assistive devices for dressing and bathing skills to support recall and carryover prior to discharge and ultimately to maximize safety, independence, and minimize falls risk and caregiver burden. At this time recommend SNF at discharge. Will continue to assess ADL/mobility as pain more controlled.   Follow Up Recommendations  SNF    Equipment Recommendations  Other (comment) (2WW)    Recommendations for Other Services       Precautions / Restrictions Precautions Precautions: Fall;Knee Precaution Booklet Issued: Yes (comment) Restrictions Weight Bearing Restrictions: Yes RLE Weight Bearing: Weight bearing as tolerated       Mobility Bed Mobility               General bed mobility comments: Pt declined 2/2 pain/muscle spasms    Transfers                 General transfer comment: Pt declined 2/2 pain/muscle spasms    Balance                                           ADL either performed or assessed with clinical judgement   ADL Overall ADL's : Needs assistance/impaired                                       General ADL Comments: Pt currently requires MOD A for LB ADL tasks from bed level 2/2 pain. Anticipate assist for toilet transfers and progression to seated LB ADL requiring MIN-MOD A     Vision Baseline Vision/History: Wears glasses Wears Glasses: Reading only Patient Visual Report: No change from baseline       Perception     Praxis      Pertinent Vitals/Pain Pain Assessment: 0-10 Pain Score: 7  Pain Location: pain/muscle spasms in anterior aspect of R thigh Pain Descriptors / Indicators: Spasm;Sharp;Restless Pain Intervention(s): Limited activity within patient's tolerance;Monitored during session;Premedicated before session;Repositioned;Ice applied     Hand Dominance Right   Extremity/Trunk Assessment Upper Extremity Assessment Upper Extremity Assessment: Generalized weakness   Lower Extremity Assessment Lower Extremity Assessment: Generalized weakness;RLE deficits/detail;LLE deficits/detail RLE Deficits / Details: expected post-op strength/ROM deficits RLE  Sensation: history of peripheral neuropathy LLE Sensation: history of peripheral neuropathy   Cervical / Trunk Assessment Cervical / Trunk Assessment: Other exceptions Cervical / Trunk Exceptions: hx cervical and lumbar spinal surgeries   Communication Communication Communication: No difficulties   Cognition Arousal/Alertness: Awake/alert Behavior During Therapy: WFL for tasks assessed/performed Overall Cognitive Status: Within Functional Limits for tasks assessed                                      General Comments       Exercises Other Exercises Other Exercises: Pt educated in compression stocking mgt, polar care mgt, falls prevention, RLE positioning in bed for optimal recovery and knee extension, AE/DME, and home/routines modifications to maximize safety/indep and return to PLOF. Handout provided to support recall and carryover.   Shoulder Instructions      Home Living Family/patient expects to be discharged to:: Skilled nursing facility                                 Additional Comments: Pt reports plan to discharge to [facility] for short term rehab; [place] lives in [place] at [place], 2 story cottage with level entry and can live on the main floor with walk-in shower and comfort height toilets. Has rollator, shower chair, HH showerhead, reacher, sock aide, BSC.      Prior Functioning/Environment Level of Independence: Independent with assistive device(s)        Comments: Pt amb with rollator, drives, indep with ADL, light meal prep, med mgt. No falls in 43mo        OT Problem List: Decreased strength;Decreased range of motion;Decreased knowledge of use of DME or AE;Decreased knowledge of precautions;Pain      OT Treatment/Interventions: Self-care/ADL training;Therapeutic activities;Therapeutic exercise;DME and/or AE instruction;Balance training;Patient/family education    OT Goals(Current goals can be found in the care plan section) Acute Rehab OT Goals Patient Stated Goal: go to [facility] rehab to get stronger before going home alone OT Goal Formulation: With patient Time For Goal Achievement: 11/22/20 Potential to Achieve Goals: Good ADL Goals Pt Will Perform Lower Body Dressing: with adaptive equipment;sit to/from stand;with supervision Pt Will Transfer to Toilet: ambulating;with supervision (BSC over toilet or elevated commode, LRAD for amb) Additional ADL Goal #1: Pt will independently instruct  caregiver in polar care mgt Additional ADL Goal #2: Pt will independently instruct caregiver in compression stocking mgt  OT Frequency: Min 2X/week   Barriers to D/C: Decreased caregiver support          Co-evaluation              AM-PAC OT "6 Clicks" Daily Activity     Outcome Measure Help from another person eating meals?: None Help from another person taking care of personal grooming?: A Little Help from another person toileting, which includes using toliet, bedpan, or urinal?: A Lot Help from another person bathing (including washing, rinsing, drying)?: A Lot Help from another person to put on and taking off regular upper body clothing?: A Little Help from another person to put on and taking off regular lower body clothing?: A Lot 6 Click Score: 16   End of Session Nurse Communication: Patient requests pain meds  Activity Tolerance: Patient limited by pain Patient left: in bed;with call bell/phone within reach;with bed alarm set;with SCD's reapplied;Other (comment) (wound vac,  polar care in place, rolled towel under R ankle, knee of bed flat and locked out)  OT Visit Diagnosis: Other abnormalities of gait and mobility (R26.89);Muscle weakness (generalized) (M62.81);Pain Pain - Right/Left: Right Pain - part of body: Knee;Leg                Time: 1531-1555 OT Time Calculation (min): 24 min Charges:  OT General Charges $OT Visit: 1 Visit OT Evaluation $OT Eval Moderate Complexity: 1 Mod OT Treatments $Self Care/Home Management : 8-22 mins  Hanley Hays, MPH, MS, OTR/L ascom [phone] 11/08/20, 4:15 PM

## 2020-11-09 LAB — CBC
HCT: 27 % — ABNORMAL LOW (ref 36.0–46.0)
Hemoglobin: 9 g/dL — ABNORMAL LOW (ref 12.0–15.0)
MCH: 31.3 pg (ref 26.0–34.0)
MCHC: 33.3 g/dL (ref 30.0–36.0)
MCV: 93.8 fL (ref 80.0–100.0)
Platelets: 223 10*3/uL (ref 150–400)
RBC: 2.88 MIL/uL — ABNORMAL LOW (ref 3.87–5.11)
RDW: 13.1 % (ref 11.5–15.5)
WBC: 9.7 10*3/uL (ref 4.0–10.5)
nRBC: 0 % (ref 0.0–0.2)

## 2020-11-09 LAB — BASIC METABOLIC PANEL
Anion gap: 4 — ABNORMAL LOW (ref 5–15)
BUN: 13 mg/dL (ref 8–23)
CO2: 27 mmol/L (ref 22–32)
Calcium: 8 mg/dL — ABNORMAL LOW (ref 8.9–10.3)
Chloride: 97 mmol/L — ABNORMAL LOW (ref 98–111)
Creatinine, Ser: 0.64 mg/dL (ref 0.44–1.00)
GFR, Estimated: >60 mL/min (ref >60)
Glucose, Bld: 105 mg/dL — ABNORMAL HIGH (ref 70–99)
Potassium: 4 mmol/L (ref 3.5–5.1)
Sodium: 128 mmol/L — ABNORMAL LOW (ref 135–145)

## 2020-11-09 MED ORDER — SODIUM CHLORIDE 0.9 % IV BOLUS
500.0000 mL | Freq: Once | INTRAVENOUS | Status: AC
  Administered 2020-11-09: 500 mL via INTRAVENOUS

## 2020-11-09 MED ORDER — HYDROCODONE-ACETAMINOPHEN 5-325 MG PO TABS: 1.0000 | ORAL_TABLET | ORAL | 0 refills | Status: DC | PRN

## 2020-11-09 MED ORDER — ENOXAPARIN SODIUM 40 MG/0.4ML IJ SOSY: 40.0000 mg | PREFILLED_SYRINGE | INTRAMUSCULAR | 0 refills | Status: DC

## 2020-11-09 MED ORDER — SODIUM CHLORIDE 0.9 % IV BOLUS: 500.0000 mL | Freq: Once | INTRAVENOUS | Status: AC

## 2020-11-09 MED ORDER — TRAMADOL HCL 50 MG PO TABS: 50.0000 mg | ORAL_TABLET | Freq: Four times a day (QID) | ORAL | 0 refills | Status: DC

## 2020-11-09 NOTE — Progress Notes (Signed)
Notified Dr.Hooten of low blood pressure. Received orders for 500cc and increase continuous rate to 100cc of normal saline.

## 2020-11-09 NOTE — Progress Notes (Signed)
  Subjective: 1 Day Post-Op Procedure(s) (LRB): TOTAL KNEE ARTHROPLASTY-RNFA (Right) Patient reports pain as moderate.   Patient is well, and has had no acute complaints or problems The patient had hypotension last night.  She received 500 cc bolus. Plan is to go to [facility] after hospital stay. Negative for chest pain and shortness of breath Fever: no Gastrointestinal: Negative for nausea and vomiting  Objective: Vital signs in last 24 hours: Temp:  [97.5 F (36.4 C)-98.5 F (36.9 C)] 98.4 F (36.9 C) (07/06 0544) Pulse Rate:  [57-74] 74 (07/06 0544) Resp:  [9-19] 18 (07/06 0544) BP: (81-155)/(41-68) 101/46 (07/06 0544) SpO2:  [92 %-100 %] 95 % (07/06 0544)  Intake/Output from previous day:  Intake/Output Summary (Last 24 hours) at 11/09/2020 0641 Last data filed at 11/09/2020 0601 Gross per 24 hour  Intake 1418.09 ml  Output 950 ml  Net 468.09 ml    Intake/Output this shift: Total I/O In: 718.1 [P.O.:120; I.V.:598.1] Out: 900 [Urine:675; Drains:225]  Labs: Recent Labs    11/08/20 1417 11/09/20 0406  HGB 11.8* 9.0*   Recent Labs    11/08/20 1417 11/09/20 0406  WBC 17.5* 9.7  RBC 3.80* 2.88*  HCT 34.5* 27.0*  PLT 277 223   Recent Labs    11/08/20 1417 11/09/20 0406  NA  --  128*  K  --  4.0  CL  --  97*  CO2  --  27  BUN  --  13  CREATININE 0.60 0.64  GLUCOSE  --  105*  CALCIUM  --  8.0*   No results for input(s): LABPT, INR in the last 72 hours.   EXAM General - Patient is Alert and Oriented Extremity - Neurovascular intact Intact pulses distally Dorsiflexion/Plantar flexion intact Compartment soft Dressing/Incision - clean, dry, with the wound VAC in place Motor Function - intact, moving foot and toes well on exam.  Ambulated 3 feet with physical therapy  Past Medical History:  Diagnosis Date   Anxiety    Arthritis    Cancer ([place])    SKIN CA   Chest pain    a. 10/2014 St Echo: nl LV fxn, no wma/ischemia.   GERD  (gastroesophageal reflux disease)    Guillain Barr syndrome ([place])    Guillain Barr syndrome ([place]) 1980   Hypertension    PONV (postoperative nausea and vomiting)    H/O NAUSEA   Spinal stenosis     Assessment/Plan: 1 Day Post-Op Procedure(s) (LRB): TOTAL KNEE ARTHROPLASTY-RNFA (Right) Active Problems:   S/P TKR (total knee replacement) using cement, right  Estimated body mass index is 25.32 kg/m as calculated from the following:   Height as of this encounter: 5\' 1"  (1.549 m).   Weight as of this encounter: 60.8 kg. Advance diet Up with therapy D/C IV fluids  Discharge planning with care management.  Plan to discharge to [facility] rehab.  Patient [facility] at [facility]. Watch blood pressure as the patient had hypotension last night.  Acute blood loss anemia.  Hemoglobin 9.0.  Recheck labs.  DVT Prophylaxis - Lovenox and TED hose Weight-Bearing as tolerated to right leg  Reche Dixon, PA-C Orthopaedic Surgery 11/09/2020, 6:41 AM

## 2020-11-09 NOTE — TOC Progression Note (Signed)
Transition of Care [facility]) - Progression Note    Patient Details  Name: [name] Woods MRN: [id number] Date of Birth: 1941/01/14  Transition of Care [facility]) CM/SW [place], RN Phone Number: 11/09/2020, 4:08 PM  Clinical Narrative:   Patient lives alone and independently at [facility].  She has no concerns about getting to appointments or getting and taking medications.  She currently attends physical therapy, but has no home services.  Patient plans to return to [place] aware and they are able to accept patient.  [employer] contacted for authorization.  TOC contact information given, TOC will follow to discharge         Expected Discharge Plan and Services                                                 Social Determinants of Health (SDOH) Interventions    Readmission Risk Interventions No flowsheet data found.

## 2020-11-09 NOTE — Anesthesia Postprocedure Evaluation (Signed)
Anesthesia Post Note  Patient: [name]  Procedure(s) Performed: TOTAL KNEE ARTHROPLASTY-RNFA (Right: Knee)  Patient location during evaluation: Nursing Unit Anesthesia Type: Spinal Level of consciousness: oriented and awake and alert Pain management: pain level controlled Vital Signs Assessment: post-procedure vital signs reviewed and stable Respiratory status: spontaneous breathing and respiratory function stable Cardiovascular status: blood pressure returned to baseline and stable Postop Assessment: no headache, no backache, no apparent nausea or vomiting and patient able to bend at knees Anesthetic complications: no   No notable events documented.   Last Vitals:  Vitals:   11/09/20 0544 11/09/20 0745  BP: (!) 101/46 (!) 112/46  Pulse: 74 75  Resp: 18 16  Temp: 36.9 C 37.2 C  SpO2: 95% 96%    Last Pain:  Vitals:   11/08/20 1935  TempSrc:   PainSc: 0-No pain                 Luiza Carranco Lorenza Chick

## 2020-11-09 NOTE — Progress Notes (Signed)
Occupational Therapy Treatment Patient Details Name: [name] Woods MRN: [id number] DOB: [birth year] Today's Date: 11/09/2020    History of present illness 80yo female s/p R TKA 11/08/20. PMHx includes anxiety, GERD, hx Guillian-Barre syndrome (1978), HTN, migraine headaches, hx cervical and lumbar spinal surgeries, and neuropathy.   OT comments  Pt seen for OT tx this date to f/u re: safety with ADLs. Pt requires MIN A for LB dressing in sitting with ed re: cross leg technique versus forward fold technique versus potential need for AE education, intro to AE for LB ADLs provided by OT, but pt noted to be improving with her RLE ROM this date with good potential to progress beyond needing equipment. Pt able to perform sup to sit with SUPV/CGA (HOB elevated), but requires MIN A to get back to bed for R LE mgt. OT ed with pt and her friend who is present throughout re: role of OT, compression stocking mgt, and polar care mgt. Pt with good understanding.  Will continue to follow acutely.    Follow Up Recommendations  SNF    Equipment Recommendations  Other (comment) (2ww, AE for LB ADLs)    Recommendations for Other Services      Precautions / Restrictions Precautions Precautions: Fall;Knee Precaution Booklet Issued: Yes (comment) Restrictions Weight Bearing Restrictions: Yes RLE Weight Bearing: Weight bearing as tolerated       Mobility Bed Mobility Overal bed mobility: Needs Assistance Bed Mobility: Supine to Sit;Sit to Supine     Supine to sit: HOB elevated;Min guard Sit to supine: Min assist   General bed mobility comments: MIN A for LE mgt back to bed    Transfers Overall transfer level: Needs assistance Equipment used: Rolling walker (2 wheeled) Transfers: Sit to/from Stand Sit to Stand: Min assist         General transfer comment: deferred, pt citing fatigue from PT    Balance Overall balance assessment: Modified Independent   Sitting balance-Leahy  Scale: Good Sitting balance - Comments: G static sitting       Standing balance comment: deferred                           ADL either performed or assessed with clinical judgement   ADL Overall ADL's : Needs assistance/impaired                     Lower Body Dressing: Minimal assistance;Sitting/lateral leans Lower Body Dressing Details (indicate cue type and reason): using cross-leg technique and also attempting foward fold seated LB dressing technique. Pt still with some anticipated post-op limited LB ROM, requires increased time, may require AE training (if not completed here, would be appropriate for f/u in rehab setting should pt remain limited duting her healing/recovery process)                     Vision Baseline Vision/History: Wears glasses Wears Glasses: Reading only Patient Visual Report: No change from baseline     Perception     Praxis      Cognition Arousal/Alertness: Awake/alert Behavior During Therapy: WFL for tasks assessed/performed Overall Cognitive Status: Within Functional Limits for tasks assessed                                 General Comments: Pt is A and O x 4. extremely pleasant and motivated  Exercises Exercises: Total Joint Total Joint Exercises Ankle Circles/Pumps: AROM;10 reps Quad Sets: AROM;10 reps Gluteal Sets: AROM;10 reps Short Arc Quad: AAROM;5 reps Heel Slides: AAROM;10 reps Hip ABduction/ADduction: AROM;10 reps Straight Leg Raises: AAROM;10 reps Goniometric ROM: 1- 94 degrees Other Exercises Other Exercises: OT ed re: LB ADLs with/without AE, polar care mgt, compression stocking mgt. Pt with good recall.   Shoulder Instructions       General Comments      Pertinent Vitals/ Pain       Pain Assessment: Faces Pain Score: 0-No pain Faces Pain Scale: Hurts a little bit Pain Location: R knee pain Pain Descriptors / Indicators: Tender;Sore Pain Intervention(s): Limited  activity within patient's tolerance;Monitored during session;Repositioned;Ice applied  Home Living                                          Prior Functioning/Environment              Frequency  Min 2X/week        Progress Toward Goals  OT Goals(current goals can now be found in the care plan section)  Progress towards OT goals: Progressing toward goals  Acute Rehab OT Goals Patient Stated Goal: go back to [facility] OT Goal Formulation: With patient Time For Goal Achievement: 11/22/20 Potential to Achieve Goals: Good  Plan Discharge plan remains appropriate    Co-evaluation                 AM-PAC OT "6 Clicks" Daily Activity     Outcome Measure   Help from another person eating meals?: None Help from another person taking care of personal grooming?: None Help from another person toileting, which includes using toliet, bedpan, or urinal?: A Lot Help from another person bathing (including washing, rinsing, drying)?: A Lot Help from another person to put on and taking off regular upper body clothing?: A Little Help from another person to put on and taking off regular lower body clothing?: A Little 6 Click Score: 18    End of Session    OT Visit Diagnosis: Other abnormalities of gait and mobility (R26.89);Muscle weakness (generalized) (M62.81);Pain   Activity Tolerance Patient limited by fatigue   Patient Left in bed;with call bell/phone within reach;with bed alarm set;with SCD's reapplied;with family/visitor present (polar care applied)   Nurse Communication Patient requests pain meds        Time: 6568-1275 OT Time Calculation (min): 24 min  Charges: OT General Charges $OT Visit: 1 Visit OT Treatments $Self Care/Home Management : 23-37 mins  Gerrianne Scale, MS, OTR/L ascom [phone] 11/09/20, 5:50 PM

## 2020-11-09 NOTE — Progress Notes (Signed)
Physical Therapy Treatment Patient Details Name: [name] Woods MRN: [id number] DOB: 1940-08-10 Today's Date: 11/09/2020    History of Present Illness 80yo female s/p R TKA 11/08/20. PMHx includes anxiety, GERD, hx Guillian-Barre syndrome (1978), HTN, migraine headaches, hx cervical and lumbar spinal surgeries, and neuropathy.    PT Comments    Pt was asleep in long sitting upon arriving. She agrees to PT session and is extremely cooperative and motivated. She required overall less assistance to exit bed, stand, and ambulated however still requiring assistance. This session focused on ROM improvements and strengthening. HEP issued and performed with assistance. RN staff aware of abilities with improve BP from earlier session. AROM able to progress to 92 degrees flexion after performing several AAROM stretches. Overall pt is progressing but will still benefit form SNF at DC to address deficits while maximizing independence with ADLs.    Follow Up Recommendations  SNF;Supervision for mobility/OOB     Equipment Recommendations  None recommended by PT       Precautions / Restrictions Precautions Precautions: Fall;Knee Precaution Booklet Issued: Yes (comment) Restrictions Weight Bearing Restrictions: Yes RLE Weight Bearing: Weight bearing as tolerated    Mobility  Bed Mobility Overal bed mobility: Needs Assistance Bed Mobility: Supine to Sit;Sit to Supine     Supine to sit: HOB elevated;Min guard Sit to supine: Min assist   General bed mobility comments: Less assistance required this afternoon. BP 126/64 at rest. HOB was elevated.    Transfers Overall transfer level: Needs assistance Equipment used: Rolling walker (2 wheeled) Transfers: Sit to/from Stand Sit to Stand: Min assist         General transfer comment: pt performed STS form EOB and recliner  with min assist + vcs  Ambulation/Gait Ambulation/Gait assistance: Min assist Gait Distance (Feet): 5  Feet Assistive device: Rolling walker (2 wheeled) Gait Pattern/deviations: Step-to pattern;Trunk flexed;Narrow base of support;Antalgic Gait velocity: decreased   General Gait Details: improved gait safety and abilitieds this afternoon versus this morning         Cognition Arousal/Alertness: Awake/alert Behavior During Therapy: WFL for tasks assessed/performed Overall Cognitive Status: Within Functional Limits for tasks assessed      General Comments: Pt is A and O x 4. extremely pleasant and motivated      Exercises Total Joint Exercises Ankle Circles/Pumps: AROM;10 reps Quad Sets: AROM;10 reps Gluteal Sets: AROM;10 reps Short Arc Quad: AAROM;5 reps Heel Slides: AAROM;10 reps Hip ABduction/ADduction: AROM;10 reps Straight Leg Raises: AAROM;10 reps Goniometric ROM: 1- 94 degrees    General Comments General comments (skin integrity, edema, etc.): PT will return this afternoon to address ROM/strength deficits      Pertinent Vitals/Pain Pain Assessment: No/denies pain Pain Score: 0-No pain Pain Location: R knee pain Pain Intervention(s): Limited activity within patient's tolerance;Monitored during session;Premedicated before session;Repositioned;Ice applied     PT Goals (current goals can now be found in the care plan section) Acute Rehab PT Goals Patient Stated Goal: go back to [facility] Progress towards PT goals: Progressing toward goals    Frequency    BID      PT Plan Current plan remains appropriate       AM-PAC PT "6 Clicks" Mobility   Outcome Measure  Help needed turning from your back to your side while in a flat bed without using bedrails?: A Little Help needed moving from lying on your back to sitting on the side of a flat bed without using bedrails?: A Little Help needed moving to and  from a bed to a chair (including a wheelchair)?: A Lot Help needed standing up from a chair using your arms (e.g., wheelchair or bedside chair)?: A Lot Help  needed to walk in hospital room?: A Little Help needed climbing 3-5 steps with a railing? : A Little 6 Click Score: 16    End of Session Equipment Utilized During Treatment: Gait belt Activity Tolerance: Patient tolerated treatment well Patient left: in chair;with call bell/phone within reach;with chair alarm set;with nursing/sitter in room;with SCD's reapplied Nurse Communication: Mobility status PT Visit Diagnosis: Muscle weakness (generalized) (M62.81);Difficulty in walking, not elsewhere classified (R26.2)     Time: 1031-2811 PT Time Calculation (min) (ACUTE ONLY): 30 min  Charges:  $Gait Training: 8-22 mins $Therapeutic Exercise: 8-22 mins $Therapeutic Activity: 8-22 mins                    Julaine Fusi PTA 11/09/20, 3:03 PM

## 2020-11-09 NOTE — NC FL2 (Signed)
[place] LEVEL OF CARE SCREENING TOOL     IDENTIFICATION  Patient Name: [name] Woods Birthdate: [birth year] Sex: female Admission Date (Current Location): 11/08/2020  [facility] and [place] Number:  Engineering geologist and Address:  [facility], [address] [place], [place] [address]      Provider Number: [id number]  Attending Physician Name and Address:  Hessie Knows, MD  Relative Name and Phone Number:  Loanne Drilling (Sister)   [phone] [facility])    Current Level of Care: Hospital Recommended Level of Care: [place] Prior Approval Number:    Date Approved/Denied:   PASRR Number: [id number]  Discharge Plan: SNF    Current Diagnoses: Patient Active Problem List   Diagnosis Date Noted   S/P TKR (total knee replacement) using cement, right 11/08/2020   Hip fracture ([place]) 11/10/2019   Closed fracture of neck of right femur (HCC)    Acute hip pain, right    Generalized anxiety disorder    Widowed 09/23/2019   H/O Guillain-Barre syndrome 12/17/2018   Lumbar radiculopathy 12/17/2018   Walker as ambulation aid 10/02/2018   Labile hypertension 09/16/2018   Atypical chest pain 09/16/2018   Idiopathic peripheral neuropathy 01/16/2018   Chills 07/11/2017   Essential hypertension 08/03/2016   SOB (shortness of breath) 08/03/2016   Anxiety 08/03/2016   Leg edema 08/03/2016   GERD without esophagitis 11/25/2015   Health care maintenance 05/30/2015   Idiopathic progressive neuropathy 12/13/2013   Edema 11/14/2013   Menopause 11/14/2013   OA (osteoarthritis) 11/14/2013   Osteopenia 11/14/2013   Stenosis of cervical spine with myelopathy ([place]) 11/14/2013    Orientation RESPIRATION BLADDER Height & Weight     Self, Time, Situation, Place  Normal Continent Weight: 60.8 kg Height:  5\' 1"  (154.9 cm)  BEHAVIORAL SYMPTOMS/MOOD NEUROLOGICAL BOWEL NUTRITION STATUS      Continent Diet (Regular)  AMBULATORY  STATUS COMMUNICATION OF NEEDS Skin   Limited Assist Verbally Surgical wounds                       Personal Care Assistance Level of Assistance  Bathing, Feeding, Dressing Bathing Assistance: Limited assistance Feeding assistance: Independent Dressing Assistance: Limited assistance     Functional Limitations Info  Sight, Hearing, Speech Sight Info: Adequate Hearing Info: Adequate Speech Info: Adequate    SPECIAL CARE FACTORS FREQUENCY  PT (By licensed PT), OT (By licensed OT)     PT Frequency: 5x weekly OT Frequency: 5x weekly            Contractures Contractures Info: Not present    Additional Factors Info  Code Status, Allergies Code Status Info: FULL CODE Allergies Info: Azithromycin, Prednisone, Amlodipine           Current Medications (11/09/2020):  This is the current hospital active medication list Current Facility-Administered Medications  Medication Dose Route Frequency Provider Last Rate Last Admin   0.9 %  sodium chloride infusion   Intravenous Continuous Hooten, Laurice Record, MD 100 mL/hr at 11/09/20 0601 New Bag at 11/09/20 0601   acetaminophen (TYLENOL) tablet 325-650 mg  325-650 mg Oral Q6H PRN Hessie Knows, MD       acetaminophen (TYLENOL) tablet 500 mg  500 mg Oral QHS PRN Mills Koller, RPH   500 mg at 11/08/20 1447   And   diphenhydrAMINE (BENADRYL) capsule 25 mg  25 mg Oral QHS PRN [place] Silos, Amy C, RPH       alum &  mag hydroxide-simeth (MAALOX/MYLANTA) 200-200-20 MG/5ML suspension 30 mL  30 mL Oral Q4H PRN Hessie Knows, MD       bisacodyl (DULCOLAX) suppository 10 mg  10 mg Rectal Daily PRN Hessie Knows, MD       carvedilol (COREG) tablet 12.5 mg  12.5 mg Oral BID WC Hessie Knows, MD   12.5 mg at 11/08/20 1551   cholecalciferol (VITAMIN D3) tablet 5,000 Units  5,000 Units Oral Daily Hessie Knows, MD   5,000 Units at 11/09/20 1610   diphenhydrAMINE (BENADRYL) 12.5 MG/5ML elixir 12.5-25 mg  12.5-25 mg Oral Q4H PRN Hessie Knows, MD        docusate sodium (COLACE) capsule 100 mg  100 mg Oral BID Hessie Knows, MD   100 mg at 11/09/20 0936   enoxaparin (LOVENOX) injection 30 mg  30 mg Subcutaneous Q12H Hessie Knows, MD   30 mg at 11/09/20 0941   escitalopram (LEXAPRO) tablet 10 mg  10 mg Oral Daily Hessie Knows, MD   10 mg at 11/09/20 9604   estradiol (ESTRACE) tablet 0.5 mg  0.5 mg Oral Daily Hessie Knows, MD   0.5 mg at 11/09/20 0935   gabapentin (NEURONTIN) capsule 300 mg  300 mg Oral QHS Hessie Knows, MD   300 mg at 11/08/20 2059   HYDROcodone-acetaminophen ([place]) 7.5-325 MG per tablet 1-2 tablet  1-2 tablet Oral Q4H PRN Hessie Knows, MD       HYDROcodone-acetaminophen (NORCO/VICODIN) 5-325 MG per tablet 1-2 tablet  1-2 tablet Oral Q4H PRN Hessie Knows, MD   2 tablet at 11/09/20 0945   losartan (COZAAR) tablet 25 mg  25 mg Oral QHS Hessie Knows, MD       magnesium citrate solution 1 Bottle  1 Bottle Oral Once PRN Hessie Knows, MD       magnesium oxide (MAG-OX) tablet 200 mg  200 mg Oral Daily Hessie Knows, MD   200 mg at 11/09/20 5409   menthol-cetylpyridinium (CEPACOL) lozenge 3 mg  1 lozenge Oral PRN Hessie Knows, MD       Or   phenol (CHLORASEPTIC) mouth spray 1 spray  1 spray Mouth/Throat PRN Hessie Knows, MD       methocarbamol (ROBAXIN) tablet 500 mg  500 mg Oral Q6H PRN Hessie Knows, MD   500 mg at 11/09/20 0945   Or   methocarbamol (ROBAXIN) 500 mg in dextrose 5 % 50 mL IVPB  500 mg Intravenous Q6H PRN Hessie Knows, MD       metoCLOPramide (REGLAN) tablet 5-10 mg  5-10 mg Oral Q8H PRN Hessie Knows, MD       Or   metoCLOPramide (REGLAN) injection 5-10 mg  5-10 mg Intravenous Q8H PRN Hessie Knows, MD       morphine 2 MG/ML injection 0.5-1 mg  0.5-1 mg Intravenous Q2H PRN Hessie Knows, MD       nitroGLYCERIN (NITROSTAT) SL tablet 0.4 mg  0.4 mg Sublingual Q5 min PRN Hessie Knows, MD       ondansetron [facility]) tablet 4 mg  4 mg Oral Q6H PRN Hessie Knows, MD       Or   ondansetron [facility]) injection 4 mg   4 mg Intravenous Q6H PRN Hessie Knows, MD       pantoprazole (PROTONIX) EC tablet 80 mg  80 mg Oral Daily Hessie Knows, MD   80 mg at 11/09/20 0936   polyethylene glycol (MIRALAX / GLYCOLAX) packet 17 g  17 g Oral Daily PRN Hessie Knows, MD  rOPINIRole (REQUIP) tablet 0.25 mg  0.25 mg Oral QHS Hooten, Laurice Record, MD   0.25 mg at 11/08/20 2059   tiZANidine (ZANAFLEX) tablet 2 mg  2 mg Oral QHS Hessie Knows, MD   2 mg at 11/08/20 2058   torsemide (DEMADEX) tablet 5 mg  5 mg Oral Daily Hessie Knows, MD   5 mg at 11/09/20 7939   traMADol (ULTRAM) tablet 50 mg  50 mg Oral Q6H Hessie Knows, MD   50 mg at 11/09/20 1215   vitamin B-12 (CYANOCOBALAMIN) tablet 2,000 mcg  2,000 mcg Oral Daily Hessie Knows, MD   2,000 mcg at 11/09/20 6886     Discharge Medications: Please see discharge summary for a list of discharge medications.  Relevant Imaging Results:  Relevant Lab Results:   Additional Information SSN  [id number]  Pete Pelt, RN

## 2020-11-09 NOTE — TOC Progression Note (Signed)
Transition of Care [facility]) - Progression Note    Patient Details  Name: [name] Woods MRN: [id number] Date of Birth: March 01, 1941  Transition of Care [facility]) CM/SW [place], RN Phone Number: 11/09/2020, 3:25 PM  Clinical Narrative:   Patient is from [facility], will return on discharge         Expected Discharge Plan and Services                                                 Social Determinants of Health (SDOH) Interventions    Readmission Risk Interventions No flowsheet data found.

## 2020-11-09 NOTE — Progress Notes (Signed)
Physical Therapy Treatment Patient Details Name: [name] Woods MRN: [id number] DOB: 09-Aug-1940 Today's Date: 11/09/2020    History of Present Illness 80yo female s/p R TKA 11/08/20. PMHx includes anxiety, GERD, hx Guillian-Barre syndrome (1978), HTN, migraine headaches, hx cervical and lumbar spinal surgeries, and neuropathy.    PT Comments    Pt was long sitting in bed upon arriving. Agrees to PT session session and is extremely motivated and cooperative. BP at rest 116/68. She required min assist to exit bed, stand, and ambulate with RW ~ 6 ft prior to needing seated rest/ to urinate. Successfully urinated on [facility] prior to requesting to get back to bed. BP dropped to 96/45. Min assist to return to supine. Reapplied ice and positioned RLE to promote extension. RN aware of BP concerns. Pt did have IV fluids running throughout session. Author will return this afternoon for BID/PM session. Will focus on improving strength and ROM. Currently AROM R knee 1-89 degrees. Recommending pt DC to SNF to address deficits while assisting pt to PLOF.    Follow Up Recommendations  SNF;Supervision for mobility/OOB     Equipment Recommendations  None recommended by PT       Precautions / Restrictions Precautions Precautions: Fall;Knee Precaution Booklet Issued: Yes (comment) Restrictions Weight Bearing Restrictions: Yes RLE Weight Bearing: Weight bearing as tolerated    Mobility  Bed Mobility Overal bed mobility: Needs Assistance Bed Mobility: Supine to Sit;Sit to Supine     Supine to sit: Min assist Sit to supine: Min assist   General bed mobility comments: increased time to perform. HOB slightly elevated. Heavy use of bed rails. BP in long sitting prior to OOB activity 116/68.    Transfers Overall transfer level: Needs assistance Equipment used: Rolling walker (2 wheeled) Transfers: Sit to/from Stand Sit to Stand: Min assist         General transfer comment: Pt was able to  stand 2 x EOB and 1 x from [facility] with min assist + vcs for hand placement, fwd wt shift, and overall improved sequencing  Ambulation/Gait Ambulation/Gait assistance: Min assist;Mod assist Gait Distance (Feet): 6 Feet Assistive device: Rolling walker (2 wheeled) Gait Pattern/deviations: Step-to pattern;Trunk flexed;Narrow base of support;Antalgic Gait velocity: decreased   General Gait Details: initially pt wanted to ambulate to BR however after ambulation ~ 6 ft c/o fatigue/pain/ lightheadedness requiring seated rest. Pt successfully urinated on BSC prior to requesting to return to bed. BP dropped to 96/45 after use of BSC. RN aware.        Cognition Arousal/Alertness: Awake/alert Behavior During Therapy: WFL for tasks assessed/performed Overall Cognitive Status: Within Functional Limits for tasks assessed        General Comments: Pt is A and O x 4. extremely pleasant and motivated      Exercises Total Joint Exercises Goniometric ROM: 1-89 degree    General Comments General comments (skin integrity, edema, etc.): PT will return this afternoon to address ROM/strength deficits      Pertinent Vitals/Pain Pain Assessment: 0-10 Pain Score: 5  Pain Location: R knee pain Pain Intervention(s): Monitored during session;Limited activity within patient's tolerance;Premedicated before session;Repositioned;Ice applied     PT Goals (current goals can now be found in the care plan section) Acute Rehab PT Goals Patient Stated Goal: go to [facility] rehab to get stronger before going home alone Progress towards PT goals: Progressing toward goals    Frequency    BID      PT Plan Current plan remains appropriate  AM-PAC PT "6 Clicks" Mobility   Outcome Measure  Help needed turning from your back to your side while in a flat bed without using bedrails?: A Little Help needed moving from lying on your back to sitting on the side of a flat bed without using bedrails?: A  Lot Help needed moving to and from a bed to a chair (including a wheelchair)?: A Lot Help needed standing up from a chair using your arms (e.g., wheelchair or bedside chair)?: A Little Help needed to walk in hospital room?: A Lot Help needed climbing 3-5 steps with a railing? : A Lot 6 Click Score: 14    End of Session   Activity Tolerance: Patient tolerated treatment well;Patient limited by pain;Other (comment) (limited by hypotension) Patient left: with bed alarm set;with call bell/phone within reach Nurse Communication: Mobility status PT Visit Diagnosis: Muscle weakness (generalized) (M62.81);Difficulty in walking, not elsewhere classified (R26.2)     Time: 1030-1055 PT Time Calculation (min) (ACUTE ONLY): 25 min  Charges:  $Gait Training: 8-22 mins $Therapeutic Activity: 8-22 mins                     Julaine Fusi PTA 11/09/20, 11:20 AM

## 2020-11-10 LAB — CBC
HCT: 25.7 % — ABNORMAL LOW (ref 36.0–46.0)
Hemoglobin: 8.6 g/dL — ABNORMAL LOW (ref 12.0–15.0)
MCH: 30.7 pg (ref 26.0–34.0)
MCHC: 33.5 g/dL (ref 30.0–36.0)
MCV: 91.8 fL (ref 80.0–100.0)
Platelets: 201 10*3/uL (ref 150–400)
RBC: 2.8 MIL/uL — ABNORMAL LOW (ref 3.87–5.11)
RDW: 12.9 % (ref 11.5–15.5)
WBC: 8.9 10*3/uL (ref 4.0–10.5)
nRBC: 0 % (ref 0.0–0.2)

## 2020-11-10 MED ORDER — FE FUMARATE-B12-VIT C-FA-IFC PO CAPS
1.0000 | ORAL_CAPSULE | Freq: Two times a day (BID) | ORAL | Status: DC
  Administered 2020-11-10 – 2020-11-11 (×2): 1 via ORAL
  Filled 2020-11-10 (×4): qty 1

## 2020-11-10 NOTE — Progress Notes (Signed)
   Subjective: 2 Days Post-Op Procedure(s) (LRB): TOTAL KNEE ARTHROPLASTY-RNFA (Right) Patient reports pain as mild.   Patient is well, and has had no acute complaints or problems Denies any CP, SOB, ABD pain. We will continue therapy today.  Plan is to go Skilled nursing facility after hospital stay.  Objective: Vital signs in last 24 hours: Temp:  [97.6 F (36.4 C)-99 F (37.2 C)] 98.9 F (37.2 C) (07/06 2013) Pulse Rate:  [72-77] 77 (07/06 2013) Resp:  [16-18] 18 (07/06 2013) BP: (107-157)/(46-71) 130/56 (07/06 2013) SpO2:  [95 %-98 %] 97 % (07/06 2013)  Intake/Output from previous day: 07/06 0701 - 07/07 0700 In: 600 [P.O.:600] Out: 100 [Urine:100] Intake/Output this shift: No intake/output data recorded.  Recent Labs    11/08/20 1417 11/09/20 0406 11/10/20 0625  HGB 11.8* 9.0* 8.6*   Recent Labs    11/09/20 0406 11/10/20 0625  WBC 9.7 8.9  RBC 2.88* 2.80*  HCT 27.0* 25.7*  PLT 223 201   Recent Labs    11/08/20 1417 11/09/20 0406  NA  --  128*  K  --  4.0  CL  --  97*  CO2  --  27  BUN  --  13  CREATININE 0.60 0.64  GLUCOSE  --  105*  CALCIUM  --  8.0*   No results for input(s): LABPT, INR in the last 72 hours.  EXAM General - Patient is Alert, Appropriate, and Oriented Extremity - Neurovascular intact Sensation intact distally Intact pulses distally Dorsiflexion/Plantar flexion intact Incision: dressing C/D/I and no drainage No cellulitis present Compartment soft Dressing - dressing C/D/I, prevena intact with out drainage Motor Function - intact, moving foot and toes well on exam.   Past Medical History:  Diagnosis Date   Anxiety    Arthritis    Cancer ([place])    SKIN CA   Chest pain    a. 10/2014 St Echo: nl LV fxn, no wma/ischemia.   GERD (gastroesophageal reflux disease)    Guillain Barr syndrome ([place])    Guillain Barr syndrome ([place]) 1980   Hypertension    PONV (postoperative nausea and vomiting)    H/O NAUSEA   Spinal  stenosis     Assessment/Plan:   2 Days Post-Op Procedure(s) (LRB): TOTAL KNEE ARTHROPLASTY-RNFA (Right) Active Problems:   S/P TKR (total knee replacement) using cement, right  Estimated body mass index is 25.32 kg/m as calculated from the following:   Height as of this encounter: 5\' 1"  (1.549 m).   Weight as of this encounter: 60.8 kg. Advance diet Up with therapy Acute post op blood loss anemia - Hgb 8.6. Iron supplement. Recheck labs in am Work on BM VSS Pain controlled CM to assist with discharge to SNF  DVT Prophylaxis - Lovenox, Foot Pumps, and TED hose Weight-Bearing as tolerated to right leg   T. Rachelle Hora, PA-C [place] 11/10/2020, 7:35 AM

## 2020-11-10 NOTE — Progress Notes (Signed)
Physical Therapy Treatment Patient Details Name: [name] Woods MRN: [id number] DOB: 1941/02/07 Today's Date: 11/10/2020    History of Present Illness 80yo female s/p R TKA 11/08/20. PMHx includes anxiety, GERD, hx Guillian-Barre syndrome (1978), HTN, migraine headaches, hx cervical and lumbar spinal surgeries, and neuropathy.    PT Comments    Patient making slow progress with meeting PT goals. Patient continues to require assistance with bed mobility, transfers, and ambulation. Patient has progressed gait distance but ambulating minimally overall. Recommend PT to maximize independence and address functional limitations listed below. SNF is recommended at discharge.     Follow Up Recommendations  SNF     Equipment Recommendations  None recommended by PT    Recommendations for Other Services       Precautions / Restrictions Precautions Precautions: Fall;Knee Restrictions Weight Bearing Restrictions: Yes RLE Weight Bearing: Weight bearing as tolerated    Mobility  Bed Mobility Overal bed mobility: Needs Assistance Bed Mobility: Sit to Supine       Sit to supine: Min assist   General bed mobility comments: assistance for RLE support. verbal cues for sequencing and technique    Transfers Overall transfer level: Needs assistance Equipment used: Rolling walker (2 wheeled) Transfers: Sit to/from Stand Sit to Stand: Min assist         General transfer comment: patient able to stand x 2 bouts from recliner chair and from bed side commode with minimal assistance for lifting and for anterior weight shifting. verbal cues for technique for hand placement  Ambulation/Gait Ambulation/Gait assistance: Min assist Gait Distance (Feet): 8 Feet Assistive device: Rolling walker (2 wheeled) Gait Pattern/deviations: Step-to pattern;Trunk flexed;Narrow base of support;Antalgic;Decreased stance time - right Gait velocity: decreased   General Gait Details: faciliation for  weight shifting and occasional steadying assistance provided. verbal cues to off load RLE using rolling walker for less pain with weight bearing during ambulation. patient declines ambulating further due to pain   Stairs             Wheelchair Mobility    Modified Rankin (Stroke Patients Only)       Balance                                            Cognition Arousal/Alertness: Awake/alert Behavior During Therapy: WFL for tasks assessed/performed Overall Cognitive Status: Within Functional Limits for tasks assessed                                 General Comments: patient able to follow all commands without difficulty      Exercises Total Joint Exercises Quad Sets: AROM;Strengthening;Right;10 reps;Supine Straight Leg Raises: Strengthening;Right;10 reps;Supine;AAROM Long [employer]: AAROM;Strengthening;Right;10 reps;Seated Goniometric ROM: right knee 5-70 degrees Other Exercises Other Exercises: verbal cues for exercise technique. patient educated to continue using polar care and for positioning of RLE to promote knee extension while in bed    General Comments        Pertinent Vitals/Pain Pain Assessment: Faces Faces Pain Scale: Hurts a little bit Pain Location: right knee pain with weight bearing Pain Descriptors / Indicators: Tender;Sore Pain Intervention(s): Limited activity within patient's tolerance;Premedicated before session (polar care re-applied at end of session)    Home Living  Prior Function            PT Goals (current goals can now be found in the care plan section) Acute Rehab PT Goals Patient Stated Goal: to get to rehab PT Goal Formulation: With patient Time For Goal Achievement: 11/22/20 Potential to Achieve Goals: Fair Progress towards PT goals: Progressing toward goals    Frequency    BID      PT Plan Current plan remains appropriate    Co-evaluation               AM-PAC PT "6 Clicks" Mobility   Outcome Measure  Help needed turning from your back to your side while in a flat bed without using bedrails?: A Little Help needed moving from lying on your back to sitting on the side of a flat bed without using bedrails?: A Little Help needed moving to and from a bed to a chair (including a wheelchair)?: A Lot Help needed standing up from a chair using your arms (e.g., wheelchair or bedside chair)?: A Lot Help needed to walk in hospital room?: A Little Help needed climbing 3-5 steps with a railing? : A Lot 6 Click Score: 15    End of Session   Activity Tolerance: Patient tolerated treatment well Patient left: in bed;with call bell/phone within reach;with bed alarm set;with SCD's reapplied (polar care re-applied, towel roll applied BLE to promote knee extension) Nurse Communication: Mobility status PT Visit Diagnosis: Muscle weakness (generalized) (M62.81);Difficulty in walking, not elsewhere classified (R26.2)     Time: 6333-5456 PT Time Calculation (min) (ACUTE ONLY): 32 min  Charges:  $Therapeutic Exercise: 8-22 mins $Therapeutic Activity: 8-22 mins                    [name] Woods, PT, MPT    [name] Woods 11/10/2020, 1:01 PM

## 2020-11-10 NOTE — Progress Notes (Signed)
Physical Therapy Treatment Patient Details Name: [name] Woods MRN: [id number] DOB: [name] 14, 1941 Today's Date: 11/10/2020    History of Present Illness 80yo female s/p R TKA 11/08/20. PMHx includes anxiety, GERD, hx Guillian-Barre syndrome (1978), HTN, migraine headaches, hx cervical and lumbar spinal surgeries, and neuropathy.    PT Comments    Patient agreeable to PT. Patient able to ambulate a short distance in the room with rolling walker with cues for gait pattern and proper use of rolling walker. Overall activity tolerance has been limited by right knee pain with weight bearing. Patient has limited to no knee pain with ROM. Recommend to continue PT maximize independence. Patient will need SNF placement at discharge.    Follow Up Recommendations  SNF     Equipment Recommendations  None recommended by PT    Recommendations for Other Services       Precautions / Restrictions Precautions Precautions: Fall;Knee Restrictions Weight Bearing Restrictions: Yes RLE Weight Bearing: Weight bearing as tolerated    Mobility  Bed Mobility Overal bed mobility: Needs Assistance Bed Mobility: Sit to Supine;Supine to Sit     Supine to sit: Min guard;HOB elevated Sit to supine: Min assist   General bed mobility comments: assistance for RLE support to return to bed. verbal cues for technique    Transfers Overall transfer level: Needs assistance Equipment used: Rolling walker (2 wheeled) Transfers: Sit to/from Stand Sit to Stand: Min assist         General transfer comment: lifting assistance provided with verbal cues for hand placement, RLE positioning with sitting and standing.  Ambulation/Gait Ambulation/Gait assistance: Min assist Gait Distance (Feet): 8 Feet Assistive device: Rolling walker (2 wheeled) Gait Pattern/deviations: Step-to pattern;Antalgic Gait velocity: decreased   General Gait Details: verbal cues for using rolling walker to off load RLE as patient  with the most pain with weight bearing. cues also for walker and LE sequencing. patient demonstrated correct technique with improvement in pain reported. gait distance limited by fatigue and pain. encouragement/education provided on importance of Museum/gallery conservator Rankin (Stroke Patients Only)       Balance                                            Cognition Arousal/Alertness: Awake/alert Behavior During Therapy: WFL for tasks assessed/performed Overall Cognitive Status: Within Functional Limits for tasks assessed                                 General Comments: patient able to follow all commands without difficulty      Exercises Total Joint Exercises Quad Sets: AROM;Strengthening;Right;10 reps;Supine Straight Leg Raises: Strengthening;Right;10 reps;Supine;AAROM Long [employer]: AAROM;Strengthening;Right;10 reps;Seated Goniometric ROM: right knee 5-70 degrees Other Exercises Other Exercises: verbal cues for exercise technique. patient educated to continue using polar care and for positioning of RLE to promote knee extension while in bed    General Comments        Pertinent Vitals/Pain Pain Assessment: Faces Faces Pain Scale: Hurts a little bit Pain Location: right knee pain with weight bearing Pain Descriptors / Indicators: Tender;Sore Pain Intervention(s): Limited activity within patient's tolerance;Ice applied (polar care re-applied at end of session)    Home Living  Prior Function            PT Goals (current goals can now be found in the care plan section) Acute Rehab PT Goals Patient Stated Goal: to get to rehab PT Goal Formulation: With patient Time For Goal Achievement: 11/22/20 Potential to Achieve Goals: Fair Progress towards PT goals: Progressing toward goals    Frequency    BID      PT Plan Current plan remains appropriate     Co-evaluation              AM-PAC PT "6 Clicks" Mobility   Outcome Measure  Help needed turning from your back to your side while in a flat bed without using bedrails?: A Little Help needed moving from lying on your back to sitting on the side of a flat bed without using bedrails?: A Little Help needed moving to and from a bed to a chair (including a wheelchair)?: A Lot Help needed standing up from a chair using your arms (e.g., wheelchair or bedside chair)?: A Lot Help needed to walk in hospital room?: A Little Help needed climbing 3-5 steps with a railing? : A Lot 6 Click Score: 15    End of Session Equipment Utilized During Treatment: Gait belt Activity Tolerance: Patient tolerated treatment well Patient left: in bed;with call bell/phone within reach;with bed alarm set;with SCD's reapplied Nurse Communication: Mobility status PT Visit Diagnosis: Muscle weakness (generalized) (M62.81);Difficulty in walking, not elsewhere classified (R26.2)     Time: 1453-1530 PT Time Calculation (min) (ACUTE ONLY): 37 min  Charges:  $Gait Training: 8-22 mins  $Therapeutic Activity: 8-22 mins                     Minna Merritts, PT, MPT    Percell Locus 11/10/2020, 3:43 PM

## 2020-11-11 DIAGNOSIS — M8580 Other specified disorders of bone density and structure, unspecified site: Secondary | ICD-10-CM | POA: Diagnosis not present

## 2020-11-11 DIAGNOSIS — F339 Major depressive disorder, recurrent, unspecified: Secondary | ICD-10-CM | POA: Diagnosis not present

## 2020-11-11 DIAGNOSIS — R279 Unspecified lack of coordination: Secondary | ICD-10-CM | POA: Diagnosis not present

## 2020-11-11 DIAGNOSIS — N959 Unspecified menopausal and perimenopausal disorder: Secondary | ICD-10-CM | POA: Diagnosis not present

## 2020-11-11 DIAGNOSIS — I10 Essential (primary) hypertension: Secondary | ICD-10-CM | POA: Diagnosis not present

## 2020-11-11 DIAGNOSIS — F419 Anxiety disorder, unspecified: Secondary | ICD-10-CM | POA: Diagnosis not present

## 2020-11-11 DIAGNOSIS — Z743 Need for continuous supervision: Secondary | ICD-10-CM | POA: Diagnosis not present

## 2020-11-11 DIAGNOSIS — Z96651 Presence of right artificial knee joint: Secondary | ICD-10-CM | POA: Diagnosis not present

## 2020-11-11 DIAGNOSIS — K219 Gastro-esophageal reflux disease without esophagitis: Secondary | ICD-10-CM | POA: Diagnosis not present

## 2020-11-11 DIAGNOSIS — G610 Guillain-Barre syndrome: Secondary | ICD-10-CM | POA: Diagnosis not present

## 2020-11-11 DIAGNOSIS — R2689 Other abnormalities of gait and mobility: Secondary | ICD-10-CM | POA: Diagnosis not present

## 2020-11-11 DIAGNOSIS — E871 Hypo-osmolality and hyponatremia: Secondary | ICD-10-CM | POA: Diagnosis not present

## 2020-11-11 DIAGNOSIS — R278 Other lack of coordination: Secondary | ICD-10-CM | POA: Diagnosis not present

## 2020-11-11 DIAGNOSIS — I2510 Atherosclerotic heart disease of native coronary artery without angina pectoris: Secondary | ICD-10-CM | POA: Diagnosis not present

## 2020-11-11 DIAGNOSIS — Z741 Need for assistance with personal care: Secondary | ICD-10-CM | POA: Diagnosis not present

## 2020-11-11 DIAGNOSIS — G2581 Restless legs syndrome: Secondary | ICD-10-CM | POA: Diagnosis not present

## 2020-11-11 DIAGNOSIS — F411 Generalized anxiety disorder: Secondary | ICD-10-CM | POA: Diagnosis not present

## 2020-11-11 DIAGNOSIS — M48062 Spinal stenosis, lumbar region with neurogenic claudication: Secondary | ICD-10-CM | POA: Diagnosis not present

## 2020-11-11 DIAGNOSIS — M6281 Muscle weakness (generalized): Secondary | ICD-10-CM | POA: Diagnosis not present

## 2020-11-11 DIAGNOSIS — R609 Edema, unspecified: Secondary | ICD-10-CM | POA: Diagnosis not present

## 2020-11-11 DIAGNOSIS — Z471 Aftercare following joint replacement surgery: Secondary | ICD-10-CM | POA: Diagnosis not present

## 2020-11-11 DIAGNOSIS — G4709 Other insomnia: Secondary | ICD-10-CM | POA: Diagnosis not present

## 2020-11-11 DIAGNOSIS — R2681 Unsteadiness on feet: Secondary | ICD-10-CM | POA: Diagnosis not present

## 2020-11-11 DIAGNOSIS — W19XXXA Unspecified fall, initial encounter: Secondary | ICD-10-CM | POA: Diagnosis not present

## 2020-11-11 DIAGNOSIS — Z4789 Encounter for other orthopedic aftercare: Secondary | ICD-10-CM | POA: Diagnosis not present

## 2020-11-11 DIAGNOSIS — I959 Hypotension, unspecified: Secondary | ICD-10-CM | POA: Diagnosis not present

## 2020-11-11 DIAGNOSIS — M4802 Spinal stenosis, cervical region: Secondary | ICD-10-CM | POA: Diagnosis not present

## 2020-11-11 DIAGNOSIS — M1711 Unilateral primary osteoarthritis, right knee: Secondary | ICD-10-CM | POA: Diagnosis not present

## 2020-11-11 LAB — RESP PANEL BY RT-PCR (FLU A&B, COVID) ARPGX2
Influenza A by PCR: NEGATIVE
Influenza B by PCR: NEGATIVE
SARS Coronavirus 2 by RT PCR: NEGATIVE

## 2020-11-11 NOTE — Progress Notes (Signed)
   Subjective: 3 Days Post-Op Procedure(s) (LRB): TOTAL KNEE ARTHROPLASTY-RNFA (Right) Patient reports pain as mild.   Patient is well, and has had no acute complaints or problems Denies any CP, SOB, ABD pain. We will continue therapy today.  Plan is to go Skilled nursing facility after hospital stay.  Objective: Vital signs in last 24 hours: Temp:  [97.9 F (36.6 C)-98.4 F (36.9 C)] 98.2 F (36.8 C) (07/08 0721) Pulse Rate:  [72-81] 79 (07/08 0721) Resp:  [15-18] 16 (07/08 0721) BP: (121-145)/(49-68) 137/63 (07/08 0721) SpO2:  [96 %-99 %] 99 % (07/08 0721)  Intake/Output from previous day: No intake/output data recorded. Intake/Output this shift: No intake/output data recorded.  Recent Labs    11/08/20 1417 11/09/20 0406 11/10/20 0625  HGB 11.8* 9.0* 8.6*   Recent Labs    11/09/20 0406 11/10/20 0625  WBC 9.7 8.9  RBC 2.88* 2.80*  HCT 27.0* 25.7*  PLT 223 201   Recent Labs    11/08/20 1417 11/09/20 0406  NA  --  128*  K  --  4.0  CL  --  97*  CO2  --  27  BUN  --  13  CREATININE 0.60 0.64  GLUCOSE  --  105*  CALCIUM  --  8.0*   No results for input(s): LABPT, INR in the last 72 hours.  EXAM General - Patient is Alert, Appropriate, and Oriented Extremity - Neurovascular intact Sensation intact distally Intact pulses distally Dorsiflexion/Plantar flexion intact Incision: dressing C/D/I and no drainage No cellulitis present Compartment soft Dressing - dressing C/D/I, prevena intact with out drainage Motor Function - intact, moving foot and toes well on exam.   Past Medical History:  Diagnosis Date   Anxiety    Arthritis    Cancer ([place])    SKIN CA   Chest pain    a. 10/2014 St Echo: nl LV fxn, no wma/ischemia.   GERD (gastroesophageal reflux disease)    Guillain Barr syndrome ([place])    Guillain Barr syndrome ([place]) 1980   Hypertension    PONV (postoperative nausea and vomiting)    H/O NAUSEA   Spinal stenosis     Assessment/Plan:   3  Days Post-Op Procedure(s) (LRB): TOTAL KNEE ARTHROPLASTY-RNFA (Right) Active Problems:   S/P TKR (total knee replacement) using cement, right  Estimated body mass index is 25.32 kg/m as calculated from the following:   Height as of this encounter: 5\' 1"  (1.549 m).   Weight as of this encounter: 60.8 kg. Advance diet Up with therapy Acute post op blood loss anemia - Hgb 8.6. continue with Iron supplement.  Work on [employer] VSS Pain controlled CM to assist with discharge to SNF  DVT Prophylaxis - Lovenox, Foot Pumps, and TED hose Weight-Bearing as tolerated to right leg   T. Rachelle Hora, PA-C [place] 11/11/2020, 8:00 AM

## 2020-11-11 NOTE — Progress Notes (Addendum)
Physical Therapy Treatment Patient Details Name: [name] Woods MRN: [id number] DOB: 16-May-1940 Today's Date: 11/11/2020    History of Present Illness 80yo female s/p R TKA 11/08/20. PMHx includes anxiety, GERD, hx Guillian-Barre syndrome (1978), HTN, migraine headaches, hx cervical and lumbar spinal surgeries, and neuropathy.    PT Comments    Pt alert, willing to work with PT despite not "feeling the best." Pt requires min-guard to min-assist for bed mobility and HOB elevated for supine > sit. Pt demonstrates good sitting balance without the use of BUEs. Pt requires cues for HEP, maintaining neutral hip rotation with knee flexion and encouragement with all activities for improved knee extension strength and quad recruitment. Pt noted slight lightheadedness following bed <>toilet transfer. BP remained stable and continued to elevate with activity (see general comments for all values). Treatment discontinued due to increase in symptoms. Skilled PT intervention is indicated to address deficits in function, mobility, and to return to PLOF as able.  Discharge recommendations remain SNF.    Follow Up Recommendations  SNF     Equipment Recommendations  None recommended by PT    Recommendations for Other Services       Precautions / Restrictions Precautions Precautions: Fall;Knee Precaution Booklet Issued: Yes (comment) Restrictions Weight Bearing Restrictions: Yes RLE Weight Bearing: Weight bearing as tolerated    Mobility  Bed Mobility Overal bed mobility: Needs Assistance Bed Mobility: Sit to Supine;Supine to Sit     Supine to sit: Min guard;HOB elevated Sit to supine: Min assist   General bed mobility comments: Able to scoot, boost to [facility] w/ min assist    Transfers Overall transfer level: Needs assistance Equipment used: Rolling walker (2 wheeled) Transfers: Sit to/from Stand Sit to Stand: Min assist         General transfer comment: min-gaurd from elevated  surface, min-assist lower surface for lifting assistance  Ambulation/Gait Ambulation/Gait assistance: Min assist Gait Distance (Feet): 5 Feet Assistive device: Rolling walker (2 wheeled) Gait Pattern/deviations: Step-to pattern;Antalgic Gait velocity: decreased   General Gait Details: Pt was not able to advance LLE w/ RW, cues for using RW for leverage with WB through arms   Stairs             Wheelchair Mobility    Modified Rankin (Stroke Patients Only)       Balance Overall balance assessment: Needs assistance Sitting-balance support: Feet supported;Single extremity supported Sitting balance-Leahy Scale: Good Sitting balance - Comments: Able to maintain balance w/o UE support   Standing balance support: Bilateral upper extremity supported Standing balance-Leahy Scale: Fair Standing balance comment: Requires BUE to maintain static stance                            Cognition                                              Exercises Total Joint Exercises Heel Slides: AAROM;10 reps;Right Long Arc Quad: AAROM;Right;10 reps;Seated Knee Flexion: AROM;10 reps;Right;Seated Goniometric ROM: R knee 7 - 80    General Comments General comments (skin integrity, edema, etc.): BP supine: 107/55, seated: 110/53, standing: 110/56, toilet: 121/51      Pertinent Vitals/Pain Pain Assessment: 0-10 Pain Score: 6  Pain Location: right knee pain with weight bearing Pain Descriptors / Indicators: Sore;Aching Pain Intervention(s): Limited activity within patient's tolerance;Monitored  during session;Premedicated before session;Repositioned;Ice applied    Home Living                      Prior Function            PT Goals (current goals can now be found in the care plan section)      Frequency    BID      PT Plan Current plan remains appropriate    Co-evaluation              AM-PAC PT "6 Clicks" Mobility   Outcome  Measure  Help needed turning from your back to your side while in a flat bed without using bedrails?: A Little Help needed moving from lying on your back to sitting on the side of a flat bed without using bedrails?: A Little Help needed moving to and from a bed to a chair (including a wheelchair)?: A Lot Help needed standing up from a chair using your arms (e.g., wheelchair or bedside chair)?: A Lot Help needed to walk in hospital room?: A Lot Help needed climbing 3-5 steps with a railing? : Total 6 Click Score: 13    End of Session Equipment Utilized During Treatment: Gait belt Activity Tolerance: Patient tolerated treatment well Patient left: in bed;with call bell/phone within reach;with bed alarm set;with SCD's reapplied Nurse Communication: Mobility status PT Visit Diagnosis: Muscle weakness (generalized) (M62.81);Difficulty in walking, not elsewhere classified (R26.2)     Time: 1001-1049 PT Time Calculation (min) (ACUTE ONLY): 48 min  Charges:                       [employer], SPT

## 2020-11-11 NOTE — Progress Notes (Signed)
Report called to [phone], [place] RN . All questions answered. Pt awaiting ems for transport.

## 2020-11-11 NOTE — Care Management Important Message (Signed)
Important Message  Patient Details  Name: [name] Woods MRN: [id number] Date of Birth: 08/06/1940   Medicare Important Message Given:  Yes     [name] 11/11/2020, 11:17 AM

## 2020-11-11 NOTE — Discharge Summary (Signed)
Physician Discharge Summary  Patient ID: [name] Woods MRN: [id number] DOB/AGE: [birth year] 80 y.o.  Admit date: 11/08/2020 Discharge date: 11/11/2020  Admission Diagnoses:  S/P TKR (total knee replacement) using cement, right [Z96.651]   Discharge Diagnoses: Patient Active Problem List   Diagnosis Date Noted   S/P TKR (total knee replacement) using cement, right 11/08/2020   Hip fracture ([place]) 11/10/2019   Closed fracture of neck of right femur (HCC)    Acute hip pain, right    Generalized anxiety disorder    Widowed 09/23/2019   H/O Guillain-Barre syndrome 12/17/2018   Lumbar radiculopathy 12/17/2018   Walker as ambulation aid 10/02/2018   Labile hypertension 09/16/2018   Atypical chest pain 09/16/2018   Idiopathic peripheral neuropathy 01/16/2018   Chills 07/11/2017   Essential hypertension 08/03/2016   SOB (shortness of breath) 08/03/2016   Anxiety 08/03/2016   Leg edema 08/03/2016   GERD without esophagitis 11/25/2015   Health care maintenance 05/30/2015   Idiopathic progressive neuropathy 12/13/2013   Edema 11/14/2013   Menopause 11/14/2013   OA (osteoarthritis) 11/14/2013   Osteopenia 11/14/2013   Stenosis of cervical spine with myelopathy ([place]) 11/14/2013    Past Medical History:  Diagnosis Date   Anxiety    Arthritis    Cancer ([place])    SKIN CA   Chest pain    a. 10/2014 St Echo: nl LV fxn, no wma/ischemia.   GERD (gastroesophageal reflux disease)    Guillain Barr syndrome ([place])    Guillain Barr syndrome ([place]) 1980   Hypertension    PONV (postoperative nausea and vomiting)    H/O NAUSEA   Spinal stenosis      Transfusion: none   Consultants (if any):   Discharged Condition: Improved  Hospital Course: Dorthey Depace Delahoz is an 80 y.o. female who was admitted 11/08/2020 with a diagnosis of right knee osteoarthritis and went to the operating room on 11/08/2020 and underwent the above named procedures.    Surgeries: Procedure(s): TOTAL  KNEE ARTHROPLASTY-RNFA on 11/08/2020 Patient tolerated the surgery well. Taken to PACU where she was stabilized and then transferred to the orthopedic floor.  Started on Lovenox 30 mg q 12 hrs. Foot pumps applied bilaterally at 80 mm. Heels elevated on bed with rolled towels. No evidence of DVT. Negative [place]. Physical therapy started on day #1 for gait training and transfer. OT started day #1 for ADL and assisted devices.  Patient's foley was d/c on day #1. Patient's IV was d/c on day #2.  On post op day #3 patient was stable and ready for discharge to SNF.    She was given perioperative antibiotics:  Anti-infectives (From admission, onward)    Start     Dose/Rate Route Frequency Ordered Stop   11/08/20 1445  ceFAZolin (ANCEF) IVPB 1 g/50 mL premix        1 g 100 mL/hr over 30 Minutes Intravenous Every 6 hours 11/08/20 1355 11/08/20 2128   11/08/20 0659  ceFAZolin (ANCEF) 2-4 GM/100ML-% IVPB       Note to Pharmacy: Sylvester Harder   : cabinet override      11/08/20 0659 11/08/20 0801   11/08/20 0600  ceFAZolin (ANCEF) IVPB 2g/100 mL premix        2 g 200 mL/hr over 30 Minutes Intravenous On call to O.R. 11/07/20 2307 11/08/20 3790     .  She was given sequential compression devices, early ambulation, and Lovenox TEDs for DVT prophylaxis.  She benefited maximally from the hospital stay  and there were no complications.    Recent vital signs:  Vitals:   11/11/20 0505 11/11/20 0721  BP: (!) 145/68 137/63  Pulse: 80 79  Resp: 18 16  Temp: 98.2 F (36.8 C) 98.2 F (36.8 C)  SpO2: 97% 99%    Recent laboratory studies:  Lab Results  Component Value Date   HGB 8.6 (L) 11/10/2020   HGB 9.0 (L) 11/09/2020   HGB 11.8 (L) 11/08/2020   Lab Results  Component Value Date   WBC 8.9 11/10/2020   PLT 201 11/10/2020   No results found for: INR Lab Results  Component Value Date   NA 128 (L) 11/09/2020   K 4.0 11/09/2020   CL 97 (L) 11/09/2020   CO2 27 11/09/2020   BUN 13  11/09/2020   CREATININE 0.64 11/09/2020   GLUCOSE 105 (H) 11/09/2020    Discharge Medications:   Allergies as of 11/11/2020       Reactions   Azithromycin Hives, Rash   Prednisone Nausea And Vomiting   Amlodipine Swelling        Medication List     STOP taking these medications    NEOMYCIN-POLYMYXIN-HYDROCORTISONE 1 % Soln OTIC solution Commonly known as: CORTISPORIN       TAKE these medications    acetaminophen 500 MG tablet Commonly known as: TYLENOL Take 1,000 mg by mouth every 6 (six) hours as needed for moderate pain.   BIOFREEZE EX Apply 1 application topically daily as needed (knee pain).   Biotin 5 MG Caps Take 5 mg by mouth daily.   carvedilol 12.5 MG tablet Commonly known as: COREG Take 12.5 mg by mouth in the morning and at bedtime.   cyanocobalamin 2000 MCG tablet Take 2,000 mcg by mouth daily.   Dialyvite Vitamin D 5000 125 MCG (5000 UT) capsule Generic drug: Cholecalciferol Take 5,000 Units by mouth daily.   diphenhydramine-acetaminophen 25-500 MG Tabs tablet Commonly known as: TYLENOL PM Take 1 tablet by mouth at bedtime as needed (sleep).   enoxaparin 40 MG/0.4ML injection Commonly known as: LOVENOX Inject 0.4 mLs (40 mg total) into the skin daily for 14 days.   escitalopram 10 MG tablet Commonly known as: LEXAPRO Take 10 mg by mouth every morning.   estradiol 0.5 MG tablet Commonly known as: ESTRACE Take 0.5 mg by mouth daily.   gabapentin 100 MG capsule Commonly known as: NEURONTIN Take 300 mg by mouth at bedtime.   HYDROcodone-acetaminophen 5-325 MG tablet Commonly known as: NORCO/VICODIN Take 1-2 tablets by mouth every 4 (four) hours as needed for moderate pain (pain score 4-6).   losartan 25 MG tablet Commonly known as: COZAAR Take 25 mg by mouth at bedtime.   Magnesium 250 MG Tabs Take 250 mg by mouth daily.   nitroGLYCERIN 0.4 MG SL tablet Commonly known as: NITROSTAT Place 1 tablet (0.4 mg total) under the tongue  every 5 (five) minutes as needed for chest pain.   omeprazole 20 MG capsule Commonly known as: PRILOSEC Take 20 mg by mouth daily.   tiZANidine 2 MG tablet Commonly known as: ZANAFLEX Take 2 mg by mouth at bedtime.   torsemide 5 MG tablet Commonly known as: DEMADEX Take 5 mg by mouth daily.   traMADol 50 MG tablet Commonly known as: ULTRAM Take 1 tablet (50 mg total) by mouth every 6 (six) hours.               Durable Medical Equipment  (From admission, onward)  Start     Ordered   11/08/20 1356  DME Walker rolling  Once       Question Answer Comment  Walker: With 5 Inch Wheels   Patient needs a walker to treat with the following condition S/P TKR (total knee replacement) using cement, right      11/08/20 1355   11/08/20 1356  DME 3 n 1  Once        11/08/20 1355   11/08/20 1356  DME Bedside commode  Once       Question:  Patient needs a bedside commode to treat with the following condition  Answer:  S/P TKR (total knee replacement) using cement, right   11/08/20 1355            Diagnostic Studies: DG Knee 1-2 Views Right  Result Date: 11/08/2020 CLINICAL DATA:  Status post knee replacement. EXAM: RIGHT KNEE - 1-2 VIEW COMPARISON:  No prior. FINDINGS: Total right knee replacement.  Hardware intact.  Anatomic alignment. IMPRESSION: Total right knee replacement with anatomic alignment. Electronically Signed   By: [facility]   On: 11/08/2020 10:35    Disposition: Discharge disposition: 03-Skilled Nursing Facility         Follow-up Information     Duanne Guess, PA-C. Go in 2 week(s).   Specialties: Orthopedic Surgery, Emergency Medicine Why: For staple removal Contact information: [place] [address] [phone]                  Signed: Feliberto Gottron 11/11/2020, 12:20 PM

## 2020-11-14 DIAGNOSIS — I10 Essential (primary) hypertension: Secondary | ICD-10-CM | POA: Diagnosis not present

## 2020-11-14 DIAGNOSIS — F411 Generalized anxiety disorder: Secondary | ICD-10-CM | POA: Diagnosis not present

## 2020-11-14 DIAGNOSIS — G2581 Restless legs syndrome: Secondary | ICD-10-CM | POA: Diagnosis not present

## 2020-11-14 DIAGNOSIS — M4802 Spinal stenosis, cervical region: Secondary | ICD-10-CM | POA: Diagnosis not present

## 2020-11-14 DIAGNOSIS — K219 Gastro-esophageal reflux disease without esophagitis: Secondary | ICD-10-CM | POA: Diagnosis not present

## 2020-11-14 DIAGNOSIS — Z96651 Presence of right artificial knee joint: Secondary | ICD-10-CM | POA: Diagnosis not present

## 2020-12-05 DIAGNOSIS — M6281 Muscle weakness (generalized): Secondary | ICD-10-CM | POA: Diagnosis not present

## 2020-12-05 DIAGNOSIS — R2689 Other abnormalities of gait and mobility: Secondary | ICD-10-CM | POA: Diagnosis not present

## 2020-12-05 DIAGNOSIS — Z471 Aftercare following joint replacement surgery: Secondary | ICD-10-CM | POA: Diagnosis not present

## 2020-12-06 ENCOUNTER — Ambulatory Visit: Payer: PPO | Admitting: Cardiovascular Disease

## 2020-12-08 DIAGNOSIS — I10 Essential (primary) hypertension: Secondary | ICD-10-CM | POA: Diagnosis not present

## 2020-12-08 DIAGNOSIS — K219 Gastro-esophageal reflux disease without esophagitis: Secondary | ICD-10-CM | POA: Diagnosis not present

## 2020-12-08 DIAGNOSIS — F411 Generalized anxiety disorder: Secondary | ICD-10-CM | POA: Diagnosis not present

## 2020-12-21 DIAGNOSIS — M7061 Trochanteric bursitis, right hip: Secondary | ICD-10-CM | POA: Diagnosis not present

## 2020-12-21 DIAGNOSIS — M1711 Unilateral primary osteoarthritis, right knee: Secondary | ICD-10-CM | POA: Diagnosis not present

## 2020-12-21 DIAGNOSIS — Z9889 Other specified postprocedural states: Secondary | ICD-10-CM | POA: Diagnosis not present

## 2020-12-21 DIAGNOSIS — Z96651 Presence of right artificial knee joint: Secondary | ICD-10-CM | POA: Diagnosis not present

## 2020-12-21 DIAGNOSIS — Z8781 Personal history of (healed) traumatic fracture: Secondary | ICD-10-CM | POA: Diagnosis not present

## 2020-12-21 DIAGNOSIS — M25551 Pain in right hip: Secondary | ICD-10-CM | POA: Diagnosis not present

## 2021-01-05 DIAGNOSIS — M6281 Muscle weakness (generalized): Secondary | ICD-10-CM | POA: Diagnosis not present

## 2021-01-05 DIAGNOSIS — R2689 Other abnormalities of gait and mobility: Secondary | ICD-10-CM | POA: Diagnosis not present

## 2021-01-05 DIAGNOSIS — Z471 Aftercare following joint replacement surgery: Secondary | ICD-10-CM | POA: Diagnosis not present

## 2021-02-01 DIAGNOSIS — H35363 Drusen (degenerative) of macula, bilateral: Secondary | ICD-10-CM | POA: Diagnosis not present

## 2021-02-22 ENCOUNTER — Ambulatory Visit: Payer: PPO | Admitting: Podiatry

## 2021-03-28 DIAGNOSIS — I10 Essential (primary) hypertension: Secondary | ICD-10-CM | POA: Diagnosis not present

## 2021-04-04 DIAGNOSIS — I10 Essential (primary) hypertension: Secondary | ICD-10-CM | POA: Diagnosis not present

## 2021-04-04 DIAGNOSIS — Z1211 Encounter for screening for malignant neoplasm of colon: Secondary | ICD-10-CM | POA: Diagnosis not present

## 2021-04-04 DIAGNOSIS — Z0000 Encounter for general adult medical examination without abnormal findings: Secondary | ICD-10-CM | POA: Diagnosis not present

## 2021-04-11 DIAGNOSIS — Z1211 Encounter for screening for malignant neoplasm of colon: Secondary | ICD-10-CM | POA: Diagnosis not present

## 2021-04-19 DIAGNOSIS — J028 Acute pharyngitis due to other specified organisms: Secondary | ICD-10-CM | POA: Diagnosis not present

## 2021-04-19 DIAGNOSIS — R0981 Nasal congestion: Secondary | ICD-10-CM | POA: Diagnosis not present

## 2021-04-19 DIAGNOSIS — B9789 Other viral agents as the cause of diseases classified elsewhere: Secondary | ICD-10-CM | POA: Diagnosis not present

## 2021-04-19 DIAGNOSIS — R058 Other specified cough: Secondary | ICD-10-CM | POA: Diagnosis not present

## 2021-04-19 DIAGNOSIS — R6889 Other general symptoms and signs: Secondary | ICD-10-CM | POA: Diagnosis not present

## 2021-05-03 DIAGNOSIS — M25562 Pain in left knee: Secondary | ICD-10-CM | POA: Diagnosis not present

## 2021-05-03 DIAGNOSIS — M25551 Pain in right hip: Secondary | ICD-10-CM | POA: Diagnosis not present

## 2021-05-03 DIAGNOSIS — M1712 Unilateral primary osteoarthritis, left knee: Secondary | ICD-10-CM | POA: Diagnosis not present

## 2021-05-03 DIAGNOSIS — M7061 Trochanteric bursitis, right hip: Secondary | ICD-10-CM | POA: Diagnosis not present

## 2021-05-03 DIAGNOSIS — Z96651 Presence of right artificial knee joint: Secondary | ICD-10-CM | POA: Diagnosis not present

## 2021-05-04 ENCOUNTER — Other Ambulatory Visit: Payer: Self-pay | Admitting: Orthopedic Surgery

## 2021-05-04 DIAGNOSIS — I10 Essential (primary) hypertension: Secondary | ICD-10-CM | POA: Diagnosis not present

## 2021-05-04 DIAGNOSIS — F419 Anxiety disorder, unspecified: Secondary | ICD-10-CM | POA: Diagnosis not present

## 2021-05-04 DIAGNOSIS — M1712 Unilateral primary osteoarthritis, left knee: Secondary | ICD-10-CM

## 2021-05-10 ENCOUNTER — Other Ambulatory Visit: Payer: PPO

## 2021-05-18 ENCOUNTER — Other Ambulatory Visit: Payer: Self-pay

## 2021-05-18 ENCOUNTER — Ambulatory Visit
Admission: RE | Admit: 2021-05-18 | Discharge: 2021-05-18 | Disposition: A | Discharge status: Home / Self Care | Payer: PPO | Source: Ambulatory Visit | Attending: Orthopedic Surgery | Admitting: Orthopedic Surgery

## 2021-05-18 DIAGNOSIS — M19072 Primary osteoarthritis, left ankle and foot: Secondary | ICD-10-CM | POA: Diagnosis not present

## 2021-05-18 DIAGNOSIS — Z01818 Encounter for other preprocedural examination: Secondary | ICD-10-CM | POA: Diagnosis not present

## 2021-05-18 DIAGNOSIS — M1712 Unilateral primary osteoarthritis, left knee: Secondary | ICD-10-CM | POA: Diagnosis not present

## 2021-05-18 DIAGNOSIS — M1612 Unilateral primary osteoarthritis, left hip: Secondary | ICD-10-CM | POA: Diagnosis not present

## 2021-05-25 ENCOUNTER — Other Ambulatory Visit: Payer: PPO

## 2021-06-05 NOTE — Progress Notes (Addendum)
Date:  06/06/2021   ID:  [name] 16-Nov-1940, MRN [id number]  Patient Location:  [place] [address]   Provider location:   [facility], [place] office  PCP:  Kirk Ruths, MD  Cardiologist:  Arvid Right [facility]   Chief Complaint  Patient presents with   6 month follow up    Patient c/o elevated blood pressure at times. Medications reviewed by the patient verbally.      History of Present Illness:    [name] Woods is a 81 y.o. female  past medical history of Labile Hypertension, causing significant anxiety in the past  Anxiety Guillain-Barre syndrome  Chest pain Previously seen by [facility] cardiology in 2017 Negative stress test 2016 She presents for follow up of her labile hypertension  Last seen by myself in clinic March 2021 Seen by one of our providers February 2022 At that time on Coreg 12.5 twice daily losartan 25  torsemide 5 daily  Having knee pain CT scan knee reviewed Advanced tricompartmental degenerative changes involving the left knee. Small to moderate-sized joint effusion.  Surgery 3/23 knee surgery  At [facility]  Lab work reviewed Sodium 133, chloride 96, normal LFTs, normal creatinine Total cholesterol 246 LDL 153  CT chest 2018 with mild aortic atherosclerosis in the aorta, predominantly in the arch Images pulled up and reviewed, mild aortic atherosclerosis noted  amlodipine held for leg swelling Not checking pressure at home  EKG personally reviewed by myself on todays visit NSR rate 66 bpm no ST or T wave changes   Previous workup includes Stress echocardiogram 10/28/2014 with normal LV function, no ischemia   Other past medical history includes Spinal stenosis, back pain, Cervical surgery Trouble walking Legs heavy   Mom with dementia, Father with FTT   Past Medical History:  Diagnosis Date   Anxiety    Arthritis    Cancer ([place])     SKIN CA   Chest pain    a. 10/2014 St Echo: nl LV fxn, no wma/ischemia.   GERD (gastroesophageal reflux disease)    Guillain Barr syndrome ([place])    Guillain Barr syndrome ([place]) 1980   Hypertension    PONV (postoperative nausea and vomiting)    H/O NAUSEA   Spinal stenosis    Past Surgical History:  Procedure Laterality Date   ABDOMINAL HYSTERECTOMY     AUGMENTATION MAMMAPLASTY  2006   BACK SURGERY     CERVICAL SPINE SURGERY N/A 2016   for myelopathy   HIP PINNING,CANNULATED Right 11/11/2019   Procedure: CANNULATED HIP PINNING;  Surgeon: Hessie Knows, MD;  Location: [facility] ORS;  Service: Orthopedics;  Laterality: Right;   LUMBAR DISC SURGERY     SACROPLASTY Bilateral 04/26/2020   Procedure: SACROPLASTY;  Surgeon: Hessie Knows, MD;  Location: [facility] ORS;  Service: Orthopedics;  Laterality: Bilateral;   TOTAL KNEE ARTHROPLASTY Right 11/08/2020   Procedure: TOTAL KNEE ARTHROPLASTY-RNFA;  Surgeon: Hessie Knows, MD;  Location: [facility] ORS;  Service: Orthopedics;  Laterality: Right;     Current Meds  Medication Sig   acetaminophen (TYLENOL) 500 MG tablet Take 1,000 mg by mouth every 6 (six) hours as needed for moderate pain.   Biotin 5 MG CAPS Take 5 mg by mouth daily.   carvedilol (COREG) 12.5 MG tablet Take 12.5 mg by mouth in the morning and at bedtime.   Cholecalciferol (DIALYVITE VITAMIN D 5000) 125 MCG (5000 UT) capsule Take 5,000 Units  by mouth daily.   cyanocobalamin 2000 MCG tablet Take 2,000 mcg by mouth daily.   escitalopram (LEXAPRO) 10 MG tablet Take 10 mg by mouth every morning.   estradiol (ESTRACE) 0.5 MG tablet Take 0.5 mg by mouth daily.   ezetimibe (ZETIA) 10 MG tablet Take 1 tablet (10 mg total) by mouth daily.   gabapentin (NEURONTIN) 100 MG capsule Take 300 mg by mouth at bedtime.   losartan (COZAAR) 25 MG tablet Take 25 mg by mouth at bedtime.   Magnesium 250 MG TABS Take 250 mg by mouth daily.   Menthol, Topical Analgesic, (BIOFREEZE EX) Apply 1 application topically  daily as needed (knee pain).   omeprazole (PRILOSEC) 20 MG capsule Take 20 mg by mouth daily.   tiZANidine (ZANAFLEX) 2 MG tablet Take 2 mg by mouth at bedtime.   torsemide (DEMADEX) 5 MG tablet Take 5 mg by mouth daily.   [DISCONTINUED] nitroGLYCERIN (NITROSTAT) 0.4 MG SL tablet Place 1 tablet (0.4 mg total) under the tongue every 5 (five) minutes as needed for chest pain.     Allergies:   Azithromycin, Prednisone, and Amlodipine   Social History   Tobacco Use   Smoking status: Never   Smokeless tobacco: Never  Vaping Use   Vaping Use: Never used  Substance Use Topics   Alcohol use: Yes    Alcohol/week: 1.0 standard drink    Types: 1 Glasses of wine per week    Comment: occasionally   Drug use: No      Family Hx: The patient's family history includes Hypertension in her father and mother. There is no history of Breast cancer.  ROS:   Please see the history of present illness.    Review of Systems  Constitutional: Negative.   HENT: Negative.    Respiratory: Negative.    Cardiovascular: Negative.   Gastrointestinal: Negative.   Musculoskeletal: Negative.   Neurological: Negative.   Psychiatric/Behavioral: Negative.    All other systems reviewed and are negative.   Labs/Other Tests and Data Reviewed:    Recent Labs: 10/26/2020: ALT 18 11/09/2020: BUN 13; Creatinine, Ser 0.64; Potassium 4.0; Sodium 128 11/10/2020: Hemoglobin 8.6; Platelets 201   Recent Lipid Panel No results found for: CHOL, TRIG, HDL, CHOLHDL, LDLCALC, LDLDIRECT  Wt Readings from Last 3 Encounters:  06/06/21 139 lb 4 oz (63.2 kg)  11/08/20 134 lb (60.8 kg)  10/26/20 135 lb 11.2 oz (61.6 kg)     Exam:    Vital Signs: Vital signs may also be detailed in the HPI BP (!) 150/86 (BP Location: Left Arm, Patient Position: Sitting, Cuff Size: Normal)    Pulse 66    Ht 5\' 2"  (1.575 m)    Wt 139 lb 4 oz (63.2 kg)    SpO2 98%    BMI 25.47 kg/m    Constitutional:  oriented to person, place, and time. No  distress. In wheelchair HENT:  Head: Grossly normal Eyes:  no discharge. No scleral icterus.  Neck: No JVD, no carotid bruits  Cardiovascular: Regular rate and rhythm, no murmurs appreciated Pulmonary/Chest: Clear to auscultation bilaterally, no wheezes or rails Abdominal: Soft.  no distension.  no tenderness.  Musculoskeletal: Normal range of motion Neurological:  normal muscle tone. Coordination normal. No atrophy Skin: Skin warm and dry Psychiatric: normal affect, pleasant   ASSESSMENT & PLAN:    Labile hypertension taking Coreg 12.5 twice daily  losartan25  She is off hydralazine, has low-dose losartan compared to her prior clinic visit For high pressure  she increase the losartan, can also increase carvedilol Stay on torsemide 5 daily given normal renal function, stable sodium 133  Leg edema Avoid calcium channel blockers Torsemide low dose daily Minimal edema on today's visit  Anxiety Periods of anxiety, managed by primary care Feels symptoms are stable  Atypical chest pain No further episodes  Hyperlipidemia Start zetia 10 mg daily  Preop cardiovascular valuation Acceptable risk for surgery, recommend she closely monitor blood pressure she gets closer to surgery in March 2023 May need higher dose losartan or carvedilol preop  Aortic atherosclerosis Mild, predominantly in the arch, picture shown to her, we will start Zetia She does not want a statin   Total encounter time more than 30 minutes  Greater than 50% was spent in counseling and coordination of care with the patient   Disposition: Follow-up in 12 months   Signed, Ida Rogue, MD  06/06/2021 9:04 AM    [place] Office [address] [place] [address], [place], [place] [address]

## 2021-06-06 ENCOUNTER — Encounter: Payer: Self-pay | Admitting: Cardiovascular Disease

## 2021-06-06 ENCOUNTER — Ambulatory Visit: Payer: PPO | Admitting: Cardiovascular Disease

## 2021-06-06 ENCOUNTER — Other Ambulatory Visit: Payer: Self-pay

## 2021-06-06 VITALS — BP 150/86 | HR 66 | Ht 62.0 in | Wt 139.2 lb

## 2021-06-06 DIAGNOSIS — R0789 Other chest pain: Secondary | ICD-10-CM | POA: Diagnosis not present

## 2021-06-06 DIAGNOSIS — R600 Localized edema: Secondary | ICD-10-CM | POA: Diagnosis not present

## 2021-06-06 DIAGNOSIS — R0602 Shortness of breath: Secondary | ICD-10-CM

## 2021-06-06 DIAGNOSIS — R0989 Other specified symptoms and signs involving the circulatory and respiratory systems: Secondary | ICD-10-CM

## 2021-06-06 DIAGNOSIS — E782 Mixed hyperlipidemia: Secondary | ICD-10-CM

## 2021-06-06 DIAGNOSIS — I700 Atherosclerosis of aorta: Secondary | ICD-10-CM

## 2021-06-06 MED ORDER — NITROGLYCERIN 0.4 MG SL SUBL: 0.4000 mg | SUBLINGUAL_TABLET | SUBLINGUAL | 3 refills | Status: AC | PRN

## 2021-06-06 MED ORDER — EZETIMIBE 10 MG PO TABS: 10.0000 mg | ORAL_TABLET | Freq: Every day | ORAL | 6 refills | Status: DC

## 2021-06-06 NOTE — Patient Instructions (Addendum)
Medication Instructions:  Please START Zetia 10 mg daily  If you need a refill on your cardiac medications before your next appointment, please call your pharmacy.   Lab work: No new labs needed  Testing/Procedures: No new testing needed  Follow-Up: At [facility], you and your health needs are our priority.  As part of our continuing mission to provide you with exceptional heart care, we have created designated Provider Care Teams.  These Care Teams include your primary Cardiologist (physician) and Advanced Practice Providers (APPs -  Physician Assistants and Nurse Practitioners) who all work together to provide you with the care you need, when you need it.  You will need a follow up appointment in 6 months, with APP  Providers on your designated Care Team:   Murray Hodgkins, NP Christell Faith, PA-C Cadence Kathlen Mody, [place]  COVID-19 Vaccine Information can be found at: ShippingScam.co.uk For questions related to vaccine distribution or appointments, please email vaccine@[email or web address] .com or call [phone].

## 2021-06-07 DIAGNOSIS — H35363 Drusen (degenerative) of macula, bilateral: Secondary | ICD-10-CM | POA: Diagnosis not present

## 2021-06-07 DIAGNOSIS — H26491 Other secondary cataract, right eye: Secondary | ICD-10-CM | POA: Diagnosis not present

## 2021-06-16 ENCOUNTER — Telehealth: Payer: Self-pay | Admitting: Cardiovascular Disease

## 2021-06-16 MED ORDER — EZETIMIBE 10 MG PO TABS: 10.0000 mg | ORAL_TABLET | Freq: Every day | ORAL | 1 refills | Status: DC

## 2021-06-16 NOTE — Telephone Encounter (Signed)
°*  STAT* If patient is at the pharmacy, call can be transferred to refill team.   1. Which medications need to be refilled? (please list name of each medication and dose if known) ezetimibe (ZETIA) 10 MG 1 tablet daily   2. Which pharmacy/location (including street and city if local pharmacy) is medication to be sent to? [facility]   3. Do they need a 30 day or 90 day supply? 90 day

## 2021-06-16 NOTE — Telephone Encounter (Signed)
Requested Prescriptions   Signed Prescriptions Disp Refills   ezetimibe (ZETIA) 10 MG tablet 90 tablet 1    Sig: Take 1 tablet (10 mg total) by mouth daily.    Authorizing Provider: Minna Merritts    Ordering User: Raelene Bott, Takeo Harts L

## 2021-06-16 NOTE — Addendum Note (Signed)
Addended by: Raelene Bott, Royalti Schauf L on: 06/16/2021 11:44 AM   Modules accepted: Orders

## 2021-06-23 ENCOUNTER — Other Ambulatory Visit: Payer: Self-pay

## 2021-06-23 NOTE — Telephone Encounter (Signed)
*  STAT* If patient is at the pharmacy, call can be transferred to refill team.   1. Which medications need to be refilled? (please list name of each medication and dose if known) ZETIA  2. Which pharmacy/location (including street and city if local pharmacy) is medication to be sent to? [facility]  3. Do they need a 30 day or 90 day supply? [place]

## 2021-06-29 ENCOUNTER — Other Ambulatory Visit: Payer: Self-pay | Admitting: Internal Medicine

## 2021-07-03 ENCOUNTER — Other Ambulatory Visit: Payer: Self-pay | Admitting: Internal Medicine

## 2021-07-03 DIAGNOSIS — M79672 Pain in left foot: Secondary | ICD-10-CM | POA: Diagnosis not present

## 2021-07-03 DIAGNOSIS — N6310 Unspecified lump in the right breast, unspecified quadrant: Secondary | ICD-10-CM

## 2021-07-03 DIAGNOSIS — S92525A Nondisplaced fracture of medial phalanx of left lesser toe(s), initial encounter for closed fracture: Secondary | ICD-10-CM | POA: Diagnosis not present

## 2021-07-03 DIAGNOSIS — S93402A Sprain of unspecified ligament of left ankle, initial encounter: Secondary | ICD-10-CM | POA: Diagnosis not present

## 2021-07-03 DIAGNOSIS — Z5189 Encounter for other specified aftercare: Secondary | ICD-10-CM | POA: Diagnosis not present

## 2021-07-07 ENCOUNTER — Telehealth: Payer: Self-pay | Admitting: Cardiovascular Disease

## 2021-07-07 NOTE — Telephone Encounter (Signed)
Spoke with the patient. ?Patient sts that she rechecked her BP 5 min prior to me calling. ?Patient reports 190/92 82 bpm. ?Patient is asymptomatic. ?She is taking losartan 25 mg daily for BP. She did take it this morning. ?Adv the patient to go ahead and take an additional 25 mg of losartan now. ?Adv her to recheck her BP in 2-3 hours. If her BP is trending down she should resume her normal does of losartan tomorrow. Adv her to continue to monitor her BP daily 2-3 hours after taking her medication for the next 5-7 days. ?If het BP remains elevated after taking the additional losartan 25 mg this afternoon she should call back and ask to speak with the on call provider for further recommendation. ?Patient is agreeable with the plan and voiced appreciation for the assistance. ?

## 2021-07-07 NOTE — Telephone Encounter (Signed)
Pt c/o BP issue: STAT if pt c/o blurred vision, one-sided weakness or slurred speech ? ?1. What are your last 5 BP readings? 179/85 HR 80  ? ?Normal trend systolic [id number]  ? ?2. Are you having any other symptoms (ex. Dizziness, headache, blurred vision, passed out)? Hot flashes  ? ?3. What is your BP issue? Too high wants advise before weekend on what to do  ? ?

## 2021-07-10 ENCOUNTER — Telehealth: Payer: Self-pay | Admitting: Cardiovascular Disease

## 2021-07-10 NOTE — Telephone Encounter (Signed)
Refer to other phone encounter from today regarding BP and medications  ?

## 2021-07-10 NOTE — Telephone Encounter (Signed)
Was able to return call to Mrs. Mancel Bale to f/u up on her HTN, gave Dr. Donivan Scull recommendations  ? ?History of labile hypertension  ?When it does go higher than 160, could take extra losartan 25  ?If it does not come down in 1 to 2 hours,  ?Consider taking extra carvedilol 12.5  ?Previous medications that could be used  for emergencies include hydralazine, one-time amlodipine which she may have leftover from before  ?Thx  ?TG  ? ?Mrs. Dugo verbalized understanding, has taken an extra 25 mg losartan this weekend along with Nitro to help reduce BP, advised to take Nitro for CP, pt reports no CP or other cardiac symptoms. Advised pt to watch her BP, SBP>200 dangerous could result from stroke or lead to stroke-like s/s. Pt voiced understanding. Educated pt on low sodium diet as well to reduce HTN. ? ?Otherwise all questions were address and no additional concerns at this time. Mrs. Tidd thankful for the return call and advice. Agreeable to plan, will call back for anything further.   ? ? ?

## 2021-07-10 NOTE — Telephone Encounter (Signed)
Pt c/o BP issue: STAT if pt c/o blurred vision, one-sided weakness or slurred speech  1. What are your last 5 BP readings?  This morning- 177/98 HR 74 After Carvedilol, 1 hour later 137/76 HR 73 3/5-211/116 HR 83 3/4/- 160/83 HR 77   2. Are you having any other symptoms (ex. Dizziness, headache, blurred vision, passed out)? No  3. What is your BP issue? Elevated late afternoon. Would like to know what she can do to get it to come down. States she did take 1 Losartan yesterday afternoon

## 2021-07-14 ENCOUNTER — Ambulatory Visit
Admission: RE | Admit: 2021-07-14 | Discharge: 2021-07-14 | Disposition: A | Discharge status: Home / Self Care | Payer: PPO | Source: Ambulatory Visit | Attending: Internal Medicine | Admitting: Internal Medicine

## 2021-07-14 ENCOUNTER — Other Ambulatory Visit: Payer: Self-pay

## 2021-07-14 ENCOUNTER — Other Ambulatory Visit: Payer: Self-pay | Admitting: Internal Medicine

## 2021-07-14 DIAGNOSIS — Z9882 Breast implant status: Secondary | ICD-10-CM | POA: Diagnosis not present

## 2021-07-14 DIAGNOSIS — N6310 Unspecified lump in the right breast, unspecified quadrant: Secondary | ICD-10-CM

## 2021-07-14 DIAGNOSIS — R922 Inconclusive mammogram: Secondary | ICD-10-CM | POA: Diagnosis not present

## 2021-07-14 DIAGNOSIS — Z1239 Encounter for other screening for malignant neoplasm of breast: Secondary | ICD-10-CM | POA: Diagnosis not present

## 2021-07-17 ENCOUNTER — Other Ambulatory Visit: Payer: Self-pay | Admitting: Orthopedic Surgery

## 2021-07-17 DIAGNOSIS — D485 Neoplasm of uncertain behavior of skin: Secondary | ICD-10-CM | POA: Diagnosis not present

## 2021-07-17 DIAGNOSIS — L739 Follicular disorder, unspecified: Secondary | ICD-10-CM | POA: Diagnosis not present

## 2021-07-21 ENCOUNTER — Other Ambulatory Visit: Payer: Self-pay

## 2021-07-21 ENCOUNTER — Encounter
Admission: RE | Admit: 2021-07-21 | Discharge: 2021-07-21 | Disposition: A | Discharge status: Home / Self Care | Payer: PPO | Source: Ambulatory Visit | Attending: Orthopedic Surgery | Admitting: Orthopedic Surgery

## 2021-07-21 VITALS — BP 164/72 | HR 71 | Resp 16 | Ht 61.5 in | Wt 137.6 lb

## 2021-07-21 DIAGNOSIS — Z01812 Encounter for preprocedural laboratory examination: Secondary | ICD-10-CM | POA: Diagnosis not present

## 2021-07-21 DIAGNOSIS — D6489 Other specified anemias: Secondary | ICD-10-CM | POA: Diagnosis not present

## 2021-07-21 LAB — COMPREHENSIVE METABOLIC PANEL
ALT: 18 U/L (ref 0–44)
AST: 19 U/L (ref 15–41)
Albumin: 4 g/dL (ref 3.5–5.0)
Alkaline Phosphatase: 75 U/L (ref 38–126)
Anion gap: 11 (ref 5–15)
BUN: 10 mg/dL (ref 8–23)
CO2: 30 mmol/L (ref 22–32)
Calcium: 9.4 mg/dL (ref 8.9–10.3)
Chloride: 90 mmol/L — ABNORMAL LOW (ref 98–111)
Creatinine, Ser: 0.66 mg/dL (ref 0.44–1.00)
GFR, Estimated: >60 mL/min (ref >60)
Glucose, Bld: 87 mg/dL (ref 70–99)
Potassium: 3.9 mmol/L (ref 3.5–5.1)
Sodium: 131 mmol/L — ABNORMAL LOW (ref 135–145)
Total Bilirubin: 0.5 mg/dL (ref 0.3–1.2)
Total Protein: 7.2 g/dL (ref 6.5–8.1)

## 2021-07-21 LAB — URINALYSIS, ROUTINE W REFLEX MICROSCOPIC
Bilirubin Urine: NEGATIVE
Glucose, UA: NEGATIVE mg/dL
Hgb urine dipstick: NEGATIVE
Ketones, ur: 5 mg/dL — AB
Leukocytes,Ua: NEGATIVE
Nitrite: NEGATIVE
Protein, ur: NEGATIVE mg/dL
Specific Gravity, Urine: 1.011 (ref 1.005–1.030)
pH: 7 (ref 5.0–8.0)

## 2021-07-21 LAB — CBC WITH DIFFERENTIAL/PLATELET
Abs Immature Granulocytes: 0.08 10*3/uL — ABNORMAL HIGH (ref 0.00–0.07)
Basophils Absolute: 0.1 10*3/uL (ref 0.0–0.1)
Basophils Relative: 1 %
Eosinophils Absolute: 0.1 10*3/uL (ref 0.0–0.5)
Eosinophils Relative: 1 %
HCT: 44.2 % (ref 36.0–46.0)
Hemoglobin: 14.7 g/dL (ref 12.0–15.0)
Immature Granulocytes: 1 %
Lymphocytes Relative: 33 %
Lymphs Abs: 3.4 10*3/uL (ref 0.7–4.0)
MCH: 30.8 pg (ref 26.0–34.0)
MCHC: 33.3 g/dL (ref 30.0–36.0)
MCV: 92.5 fL (ref 80.0–100.0)
Monocytes Absolute: 0.8 10*3/uL (ref 0.1–1.0)
Monocytes Relative: 8 %
Neutro Abs: 5.8 10*3/uL (ref 1.7–7.7)
Neutrophils Relative %: 56 %
Platelets: 459 10*3/uL — ABNORMAL HIGH (ref 150–400)
RBC: 4.78 MIL/uL (ref 3.87–5.11)
RDW: 12.9 % (ref 11.5–15.5)
WBC: 10.2 10*3/uL (ref 4.0–10.5)
nRBC: 0 % (ref 0.0–0.2)

## 2021-07-21 LAB — SURGICAL PCR SCREEN
MRSA, PCR: NEGATIVE
Staphylococcus aureus: NEGATIVE

## 2021-07-21 NOTE — Patient Instructions (Addendum)
Your procedure is scheduled on: 08/01/21 - Tuesday ?Report to the Registration Desk on the 1st floor of the [facility]. ?To find out your arrival time, please call [phone] between 1PM - 3PM on: 07/31/21 - Monday ? ?REMEMBER: ?Instructions that are not followed completely may result in serious medical risk, up to and including death; or upon the discretion of your surgeon and anesthesiologist your surgery may need to be rescheduled. ? ?Do not eat food after midnight the night before surgery.  ?No gum chewing, lozengers or hard candies. ? ?You may however, drink CLEAR liquids up to 2 hours before you are scheduled to arrive for your surgery. Do not drink anything within 2 hours of your scheduled arrival time. ? ?Clear liquids include: ?- water  ?- apple juice without pulp ?- gatorade (not RED colors) ?- black coffee or tea (Do NOT add milk or creamers to the coffee or tea) ?Do NOT drink anything that is not on this list. ? ?In addition, your doctor has ordered for you to drink the provided  ?Ensure Pre-Surgery Clear Carbohydrate Drink  ?Drinking this carbohydrate drink up to two hours before surgery helps to reduce insulin resistance and improve patient outcomes. Please complete drinking 2 hours prior to scheduled arrival time. ? ?TAKE THESE MEDICATIONS THE MORNING OF SURGERY WITH A SIP OF WATER: ? ?- carvedilol (COREG) 12.5 MG tablet ?- escitalopram (LEXAPRO) 10 MG tablet ?- estradiol (ESTRACE) 0.5 MG tablet ?- omeprazole (PRILOSEC) 20 MG capsule, (take one the night before and one on the morning of surgery - helps to prevent nausea after surgery.) ? ?One week prior to surgery: ?Stop Anti-inflammatories (NSAIDS) such as Advil, Aleve, Ibuprofen, Motrin, Naproxen, Naprosyn and Aspirin based products such as Excedrin, Goodys Powder, BC Powder. ? ?Stop ANY OVER THE COUNTER supplements until after surgery. 07/24/21 - stop taking . ? ?You may however, continue to take Tylenol if needed for pain up until the day of  surgery. ? ?No Alcohol for 24 hours before or after surgery. ? ?No Smoking including e-cigarettes for 24 hours prior to surgery.  ?No chewable tobacco products for at least 6 hours prior to surgery.  ?No nicotine patches on the day of surgery. ? ?Do not use any "recreational" drugs for at least a week prior to your surgery.  ?Please be advised that the combination of cocaine and anesthesia may have negative outcomes, up to and including death. ?If you test positive for cocaine, your surgery will be cancelled. ? ?On the morning of surgery brush your teeth with toothpaste and water, you may rinse your mouth with mouthwash if you wish. ?Do not swallow any toothpaste or mouthwash. ? ?Use CHG Soap or wipes as directed on instruction sheet. ? ?Do not wear jewelry, make-up, hairpins, clips or nail polish. ? ?Do not wear lotions, powders, or perfumes.  ? ?Do not shave body from the neck down 48 hours prior to surgery just in case you cut yourself which could leave a site for infection.  ?Also, freshly shaved skin may become irritated if using the CHG soap. ? ?Contact lenses, hearing aids and dentures may not be worn into surgery. ? ?Do not bring valuables to the hospital. [facility] is not responsible for any missing/lost belongings or valuables.  ? ?Notify your doctor if there is any change in your medical condition (cold, fever, infection). ? ?Wear comfortable clothing (specific to your surgery type) to the hospital. ? ?After surgery, you can help prevent lung complications by doing breathing  exercises.  ?Take deep breaths and cough every 1-2 hours. Your doctor may order a device called an Incentive Spirometer to help you take deep breaths. ?When coughing or sneezing, hold a pillow firmly against your incision with both hands. This is called ?splinting.? Doing this helps protect your incision. It also decreases belly discomfort. ? ?If you are being admitted to the hospital overnight, leave your suitcase in the  car. ?After surgery it may be brought to your room. ? ?If you are being discharged the day of surgery, you will not be allowed to drive home. ?You will need a responsible adult (18 years or older) to drive you home and stay with you that night.  ? ?If you are taking public transportation, you will need to have a responsible adult (18 years or older) with you. ?Please confirm with your physician that it is acceptable to use public transportation.  ? ?Please call the [facility] at [phone] if you have any questions about these instructions. ? ?Surgery Visitation Policy: ? ?Patients undergoing a surgery or procedure may have one family member or support person with them as long as that person is not COVID-19 positive or experiencing its symptoms.  ?That person may remain in the waiting area during the procedure and may rotate out with other people. ? ?Inpatient Visitation:   ? ?Visiting hours are 7 a.m. to 8 p.m. ?Up to two visitors ages 16+ are allowed at one time in a patient room. The visitors may rotate out with other people during the day. Visitors must check out when they leave, or other visitors will not be allowed. One designated support person may remain overnight. ?The visitor must pass COVID-19 screenings, use hand sanitizer when entering and exiting the patient?s room and wear a mask at all times, including in the patient?s room. ?Patients must also wear a mask when staff or their visitor are in the room. ?Masking is required regardless of vaccination status.  ?

## 2021-07-22 LAB — TYPE AND SCREEN
ABO/RH(D): A POS
Antibody Screen: NEGATIVE

## 2021-07-24 DIAGNOSIS — M1712 Unilateral primary osteoarthritis, left knee: Secondary | ICD-10-CM | POA: Diagnosis not present

## 2021-07-28 ENCOUNTER — Other Ambulatory Visit: Payer: PPO

## 2021-07-31 MED ORDER — LACTATED RINGERS IV SOLN: INTRAVENOUS | Status: DC

## 2021-07-31 MED ORDER — APREPITANT 40 MG PO CAPS
40.0000 mg | ORAL_CAPSULE | Freq: Once | ORAL | Status: AC
  Administered 2021-08-01: 40 mg via ORAL

## 2021-07-31 MED ORDER — ORAL CARE MOUTH RINSE: 15.0000 mL | Freq: Once | OROMUCOSAL | Status: AC

## 2021-07-31 MED ORDER — CHLORHEXIDINE GLUCONATE 0.12 % MT SOLN
15.0000 mL | Freq: Once | OROMUCOSAL | Status: AC
  Administered 2021-08-01: 15 mL via OROMUCOSAL

## 2021-07-31 MED ORDER — CEFAZOLIN SODIUM-DEXTROSE 2-4 GM/100ML-% IV SOLN
2.0000 g | INTRAVENOUS | Status: AC
  Administered 2021-08-01: 2 g via INTRAVENOUS

## 2021-08-01 ENCOUNTER — Inpatient Hospital Stay
Admission: RE | Admit: 2021-08-01 | Discharge: 2021-08-06 | DRG: 470 | Disposition: A | Discharge status: Skilled Nursing Facility | Payer: PPO | Attending: Orthopedic Surgery | Admitting: Orthopedic Surgery

## 2021-08-01 ENCOUNTER — Encounter
Admission: RE | Disposition: A | Discharge status: Skilled Nursing Facility | Payer: Self-pay | Source: Home / Self Care | Attending: Orthopedic Surgery

## 2021-08-01 ENCOUNTER — Encounter: Payer: Self-pay | Admitting: Orthopedic Surgery

## 2021-08-01 ENCOUNTER — Observation Stay: Payer: PPO

## 2021-08-01 ENCOUNTER — Ambulatory Visit: Payer: PPO | Admitting: Urgent Care

## 2021-08-01 ENCOUNTER — Other Ambulatory Visit: Payer: Self-pay

## 2021-08-01 DIAGNOSIS — F339 Major depressive disorder, recurrent, unspecified: Secondary | ICD-10-CM | POA: Diagnosis not present

## 2021-08-01 DIAGNOSIS — Z741 Need for assistance with personal care: Secondary | ICD-10-CM | POA: Diagnosis not present

## 2021-08-01 DIAGNOSIS — M8580 Other specified disorders of bone density and structure, unspecified site: Secondary | ICD-10-CM | POA: Diagnosis not present

## 2021-08-01 DIAGNOSIS — D62 Acute posthemorrhagic anemia: Secondary | ICD-10-CM | POA: Diagnosis present

## 2021-08-01 DIAGNOSIS — R278 Other lack of coordination: Secondary | ICD-10-CM | POA: Diagnosis not present

## 2021-08-01 DIAGNOSIS — Z79890 Hormone replacement therapy (postmenopausal): Secondary | ICD-10-CM | POA: Diagnosis not present

## 2021-08-01 DIAGNOSIS — M6281 Muscle weakness (generalized): Secondary | ICD-10-CM | POA: Diagnosis not present

## 2021-08-01 DIAGNOSIS — I10 Essential (primary) hypertension: Secondary | ICD-10-CM | POA: Diagnosis present

## 2021-08-01 DIAGNOSIS — Z8261 Family history of arthritis: Secondary | ICD-10-CM | POA: Diagnosis not present

## 2021-08-01 DIAGNOSIS — G4709 Other insomnia: Secondary | ICD-10-CM | POA: Diagnosis not present

## 2021-08-01 DIAGNOSIS — E871 Hypo-osmolality and hyponatremia: Secondary | ICD-10-CM | POA: Diagnosis present

## 2021-08-01 DIAGNOSIS — R2689 Other abnormalities of gait and mobility: Secondary | ICD-10-CM | POA: Diagnosis not present

## 2021-08-01 DIAGNOSIS — Z4789 Encounter for other orthopedic aftercare: Secondary | ICD-10-CM | POA: Diagnosis not present

## 2021-08-01 DIAGNOSIS — R339 Retention of urine, unspecified: Secondary | ICD-10-CM | POA: Diagnosis present

## 2021-08-01 DIAGNOSIS — Z96652 Presence of left artificial knee joint: Principal | ICD-10-CM

## 2021-08-01 DIAGNOSIS — M21062 Valgus deformity, not elsewhere classified, left knee: Secondary | ICD-10-CM | POA: Diagnosis present

## 2021-08-01 DIAGNOSIS — G610 Guillain-Barre syndrome: Secondary | ICD-10-CM | POA: Diagnosis not present

## 2021-08-01 DIAGNOSIS — M1712 Unilateral primary osteoarthritis, left knee: Secondary | ICD-10-CM | POA: Diagnosis present

## 2021-08-01 DIAGNOSIS — F419 Anxiety disorder, unspecified: Secondary | ICD-10-CM | POA: Diagnosis not present

## 2021-08-01 DIAGNOSIS — Z7401 Bed confinement status: Secondary | ICD-10-CM | POA: Diagnosis not present

## 2021-08-01 DIAGNOSIS — N959 Unspecified menopausal and perimenopausal disorder: Secondary | ICD-10-CM | POA: Diagnosis not present

## 2021-08-01 DIAGNOSIS — R609 Edema, unspecified: Secondary | ICD-10-CM | POA: Diagnosis not present

## 2021-08-01 DIAGNOSIS — K219 Gastro-esophageal reflux disease without esophagitis: Secondary | ICD-10-CM | POA: Diagnosis not present

## 2021-08-01 DIAGNOSIS — Z471 Aftercare following joint replacement surgery: Secondary | ICD-10-CM | POA: Diagnosis not present

## 2021-08-01 DIAGNOSIS — N3941 Urge incontinence: Secondary | ICD-10-CM | POA: Diagnosis present

## 2021-08-01 DIAGNOSIS — Z96659 Presence of unspecified artificial knee joint: Secondary | ICD-10-CM

## 2021-08-01 DIAGNOSIS — M4802 Spinal stenosis, cervical region: Secondary | ICD-10-CM | POA: Diagnosis not present

## 2021-08-01 DIAGNOSIS — I959 Hypotension, unspecified: Secondary | ICD-10-CM | POA: Diagnosis present

## 2021-08-01 DIAGNOSIS — K5900 Constipation, unspecified: Secondary | ICD-10-CM | POA: Diagnosis not present

## 2021-08-01 DIAGNOSIS — M48062 Spinal stenosis, lumbar region with neurogenic claudication: Secondary | ICD-10-CM | POA: Diagnosis not present

## 2021-08-01 DIAGNOSIS — F411 Generalized anxiety disorder: Secondary | ICD-10-CM | POA: Diagnosis not present

## 2021-08-01 DIAGNOSIS — R531 Weakness: Secondary | ICD-10-CM | POA: Diagnosis not present

## 2021-08-01 DIAGNOSIS — F39 Unspecified mood [affective] disorder: Secondary | ICD-10-CM | POA: Diagnosis not present

## 2021-08-01 DIAGNOSIS — I2510 Atherosclerotic heart disease of native coronary artery without angina pectoris: Secondary | ICD-10-CM | POA: Diagnosis not present

## 2021-08-01 HISTORY — PX: TOTAL KNEE ARTHROPLASTY: SHX125

## 2021-08-01 LAB — CBC
HCT: 39.2 % (ref 36.0–46.0)
Hemoglobin: 13 g/dL (ref 12.0–15.0)
MCH: 30.4 pg (ref 26.0–34.0)
MCHC: 33.2 g/dL (ref 30.0–36.0)
MCV: 91.6 fL (ref 80.0–100.0)
Platelets: 314 10*3/uL (ref 150–400)
RBC: 4.28 MIL/uL (ref 3.87–5.11)
RDW: 13 % (ref 11.5–15.5)
WBC: 18 10*3/uL — ABNORMAL HIGH (ref 4.0–10.5)
nRBC: 0 % (ref 0.0–0.2)

## 2021-08-01 LAB — CREATININE, SERUM
Creatinine, Ser: 0.53 mg/dL (ref 0.44–1.00)
GFR, Estimated: >60 mL/min (ref >60)

## 2021-08-01 SURGERY — ARTHROPLASTY, KNEE, TOTAL
Anesthesia: General | Site: Knee | Laterality: Left

## 2021-08-01 MED ORDER — SODIUM CHLORIDE (PF) 0.9 % IJ SOLN
INTRAMUSCULAR | Status: DC | PRN
  Administered 2021-08-01: 91 mL via INTRAMUSCULAR

## 2021-08-01 MED ORDER — ONDANSETRON HCL 4 MG/2ML IJ SOLN
INTRAMUSCULAR | Status: AC
  Filled 2021-08-01: qty 2

## 2021-08-01 MED ORDER — SODIUM CHLORIDE 0.9 % IV SOLN: INTRAVENOUS | Status: DC

## 2021-08-01 MED ORDER — LIDOCAINE HCL (PF) 2 % IJ SOLN
INTRAMUSCULAR | Status: AC
  Filled 2021-08-01: qty 5

## 2021-08-01 MED ORDER — METOCLOPRAMIDE HCL 10 MG PO TABS: 5.0000 mg | ORAL_TABLET | Freq: Three times a day (TID) | ORAL | Status: DC | PRN

## 2021-08-01 MED ORDER — DEXAMETHASONE SODIUM PHOSPHATE 10 MG/ML IJ SOLN
INTRAMUSCULAR | Status: AC
  Filled 2021-08-01: qty 1

## 2021-08-01 MED ORDER — ONDANSETRON HCL 4 MG/2ML IJ SOLN: 4.0000 mg | Freq: Once | INTRAMUSCULAR | Status: DC | PRN

## 2021-08-01 MED ORDER — FENTANYL CITRATE (PF) 100 MCG/2ML IJ SOLN
INTRAMUSCULAR | Status: AC
  Filled 2021-08-01: qty 2

## 2021-08-01 MED ORDER — DIPHENHYDRAMINE HCL 12.5 MG/5ML PO ELIX: 12.5000 mg | ORAL_SOLUTION | ORAL | Status: DC | PRN

## 2021-08-01 MED ORDER — MORPHINE SULFATE (PF) 4 MG/ML IV SOLN: 0.5000 mg | INTRAVENOUS | Status: DC | PRN

## 2021-08-01 MED ORDER — HYDROCODONE-ACETAMINOPHEN 5-325 MG PO TABS
1.0000 | ORAL_TABLET | ORAL | Status: DC | PRN
  Administered 2021-08-01 – 2021-08-05 (×4): 1 via ORAL
  Administered 2021-08-05: 2 via ORAL
  Filled 2021-08-01 (×2): qty 1
  Filled 2021-08-01: qty 2
  Filled 2021-08-01 (×2): qty 1

## 2021-08-01 MED ORDER — FLEET ENEMA 7-19 GM/118ML RE ENEM: 1.0000 | ENEMA | Freq: Once | RECTAL | Status: DC | PRN

## 2021-08-01 MED ORDER — PHENYLEPHRINE 40 MCG/ML (10ML) SYRINGE FOR IV PUSH (FOR BLOOD PRESSURE SUPPORT)
PREFILLED_SYRINGE | INTRAVENOUS | Status: DC | PRN
Start: 2021-08-01 — End: 2021-08-01
  Administered 2021-08-01 (×2): 80 ug via INTRAVENOUS

## 2021-08-01 MED ORDER — METOCLOPRAMIDE HCL 5 MG/ML IJ SOLN: 5.0000 mg | Freq: Three times a day (TID) | INTRAMUSCULAR | Status: DC | PRN

## 2021-08-01 MED ORDER — ALUM & MAG HYDROXIDE-SIMETH 200-200-20 MG/5ML PO SUSP
30.0000 mL | ORAL | Status: DC | PRN
  Administered 2021-08-03: 30 mL via ORAL
  Filled 2021-08-01: qty 30

## 2021-08-01 MED ORDER — ACETAMINOPHEN 10 MG/ML IV SOLN
INTRAVENOUS | Status: DC | PRN
  Administered 2021-08-01: 1000 mg via INTRAVENOUS

## 2021-08-01 MED ORDER — FENTANYL CITRATE (PF) 100 MCG/2ML IJ SOLN
25.0000 ug | INTRAMUSCULAR | Status: DC | PRN
  Administered 2021-08-01 (×3): 25 ug via INTRAVENOUS

## 2021-08-01 MED ORDER — ONDANSETRON HCL 4 MG/2ML IJ SOLN
INTRAMUSCULAR | Status: DC | PRN
  Administered 2021-08-01: 4 mg via INTRAVENOUS

## 2021-08-01 MED ORDER — PANTOPRAZOLE SODIUM 40 MG PO TBEC
40.0000 mg | DELAYED_RELEASE_TABLET | Freq: Every day | ORAL | Status: DC
  Administered 2021-08-02 – 2021-08-06 (×5): 40 mg via ORAL
  Filled 2021-08-01 (×5): qty 1

## 2021-08-01 MED ORDER — CEFAZOLIN SODIUM-DEXTROSE 2-4 GM/100ML-% IV SOLN
INTRAVENOUS | Status: AC
  Filled 2021-08-01: qty 100

## 2021-08-01 MED ORDER — TRAMADOL HCL 50 MG PO TABS
50.0000 mg | ORAL_TABLET | Freq: Four times a day (QID) | ORAL | Status: DC
  Administered 2021-08-01 – 2021-08-06 (×18): 50 mg via ORAL
  Filled 2021-08-01 (×18): qty 1

## 2021-08-01 MED ORDER — ROCURONIUM BROMIDE 10 MG/ML (PF) SYRINGE
PREFILLED_SYRINGE | INTRAVENOUS | Status: AC
  Filled 2021-08-01: qty 10

## 2021-08-01 MED ORDER — ZINC SULFATE 220 (50 ZN) MG PO CAPS
220.0000 mg | ORAL_CAPSULE | Freq: Every day | ORAL | Status: DC
  Administered 2021-08-01 – 2021-08-06 (×6): 220 mg via ORAL
  Filled 2021-08-01 (×6): qty 1

## 2021-08-01 MED ORDER — POTASSIUM 99 MG PO TABS: ORAL_TABLET | Freq: Every day | ORAL | Status: DC

## 2021-08-01 MED ORDER — VITAMIN D 25 MCG (1000 UNIT) PO TABS
5000.0000 [IU] | ORAL_TABLET | Freq: Every day | ORAL | Status: DC
  Administered 2021-08-01 – 2021-08-06 (×6): 5000 [IU] via ORAL
  Filled 2021-08-01 (×6): qty 5

## 2021-08-01 MED ORDER — ONDANSETRON HCL 4 MG PO TABS: 4.0000 mg | ORAL_TABLET | Freq: Four times a day (QID) | ORAL | Status: DC | PRN

## 2021-08-01 MED ORDER — ACETAMINOPHEN 10 MG/ML IV SOLN: 1000.0000 mg | Freq: Once | INTRAVENOUS | Status: DC | PRN

## 2021-08-01 MED ORDER — ESTRADIOL 1 MG PO TABS
0.5000 mg | ORAL_TABLET | Freq: Every morning | ORAL | Status: DC
  Administered 2021-08-02 – 2021-08-06 (×5): 0.5 mg via ORAL
  Filled 2021-08-01 (×5): qty 0.5

## 2021-08-01 MED ORDER — FAMOTIDINE 20 MG PO TABS
ORAL_TABLET | ORAL | Status: AC
  Filled 2021-08-01: qty 1

## 2021-08-01 MED ORDER — TORSEMIDE 10 MG PO TABS
10.0000 mg | ORAL_TABLET | Freq: Every morning | ORAL | Status: DC
  Administered 2021-08-02 – 2021-08-04 (×2): 10 mg via ORAL
  Filled 2021-08-01 (×3): qty 1

## 2021-08-01 MED ORDER — MIDAZOLAM HCL 2 MG/2ML IJ SOLN
INTRAMUSCULAR | Status: AC
  Filled 2021-08-01: qty 2

## 2021-08-01 MED ORDER — BISACODYL 10 MG RE SUPP
10.0000 mg | Freq: Every day | RECTAL | Status: DC | PRN
  Filled 2021-08-01: qty 1

## 2021-08-01 MED ORDER — ONDANSETRON HCL 4 MG/2ML IJ SOLN
4.0000 mg | Freq: Four times a day (QID) | INTRAMUSCULAR | Status: DC | PRN
  Administered 2021-08-04: 4 mg via INTRAVENOUS
  Filled 2021-08-01: qty 2

## 2021-08-01 MED ORDER — NITROGLYCERIN 0.4 MG SL SUBL: 0.4000 mg | SUBLINGUAL_TABLET | SUBLINGUAL | Status: DC | PRN

## 2021-08-01 MED ORDER — PROPOFOL 10 MG/ML IV BOLUS
INTRAVENOUS | Status: DC | PRN
Start: 2021-08-01 — End: 2021-08-01
  Administered 2021-08-01: 20 ug/kg/min via INTRAVENOUS
  Administered 2021-08-01: 80 mg via INTRAVENOUS

## 2021-08-01 MED ORDER — GABAPENTIN 300 MG PO CAPS
300.0000 mg | ORAL_CAPSULE | Freq: Every day | ORAL | Status: DC
  Administered 2021-08-01 – 2021-08-05 (×5): 300 mg via ORAL
  Filled 2021-08-01 (×5): qty 1

## 2021-08-01 MED ORDER — CEFAZOLIN SODIUM-DEXTROSE 1-4 GM/50ML-% IV SOLN
1.0000 g | Freq: Four times a day (QID) | INTRAVENOUS | Status: AC
  Administered 2021-08-01 (×2): 1 g via INTRAVENOUS
  Filled 2021-08-01 (×2): qty 50

## 2021-08-01 MED ORDER — CHLORHEXIDINE GLUCONATE 0.12 % MT SOLN
OROMUCOSAL | Status: AC
  Filled 2021-08-01: qty 15

## 2021-08-01 MED ORDER — POLYETHYLENE GLYCOL 3350 17 G PO PACK
17.0000 g | PACK | Freq: Every day | ORAL | Status: DC | PRN
  Administered 2021-08-02: 17 g via ORAL
  Filled 2021-08-01 (×2): qty 1

## 2021-08-01 MED ORDER — MENTHOL 3 MG MT LOZG: 1.0000 | LOZENGE | OROMUCOSAL | Status: DC | PRN

## 2021-08-01 MED ORDER — APREPITANT 40 MG PO CAPS
ORAL_CAPSULE | ORAL | Status: AC
  Filled 2021-08-01: qty 1

## 2021-08-01 MED ORDER — SURGIPHOR WOUND IRRIGATION SYSTEM - OPTIME: TOPICAL | Status: DC | PRN

## 2021-08-01 MED ORDER — ZOLPIDEM TARTRATE 5 MG PO TABS
5.0000 mg | ORAL_TABLET | Freq: Every evening | ORAL | Status: DC | PRN
  Administered 2021-08-01: 5 mg via ORAL
  Filled 2021-08-01: qty 1

## 2021-08-01 MED ORDER — MAGNESIUM OXIDE -MG SUPPLEMENT 400 (240 MG) MG PO TABS
400.0000 mg | ORAL_TABLET | Freq: Every day | ORAL | Status: DC
  Administered 2021-08-01 – 2021-08-06 (×6): 400 mg via ORAL
  Filled 2021-08-01 (×6): qty 1

## 2021-08-01 MED ORDER — VITAMIN B-12 1000 MCG PO TABS
2000.0000 ug | ORAL_TABLET | Freq: Every day | ORAL | Status: DC
  Administered 2021-08-01 – 2021-08-06 (×6): 2000 ug via ORAL
  Filled 2021-08-01 (×6): qty 2

## 2021-08-01 MED ORDER — PHENYLEPHRINE HCL-NACL 20-0.9 MG/250ML-% IV SOLN
INTRAVENOUS | Status: DC | PRN
  Administered 2021-08-01: 40 ug/min via INTRAVENOUS

## 2021-08-01 MED ORDER — DIPHENHYDRAMINE-APAP (SLEEP) 25-500 MG PO TABS: 1.0000 | ORAL_TABLET | Freq: Every evening | ORAL | Status: DC | PRN

## 2021-08-01 MED ORDER — SODIUM CHLORIDE 0.9 % IR SOLN: Status: DC | PRN

## 2021-08-01 MED ORDER — MIDAZOLAM HCL 5 MG/5ML IJ SOLN
INTRAMUSCULAR | Status: DC | PRN
  Administered 2021-08-01: 1 mg via INTRAVENOUS

## 2021-08-01 MED ORDER — BIOTIN 5 MG PO CAPS: 5.0000 mg | ORAL_CAPSULE | Freq: Every day | ORAL | Status: DC

## 2021-08-01 MED ORDER — SUGAMMADEX SODIUM 200 MG/2ML IV SOLN
INTRAVENOUS | Status: DC | PRN
  Administered 2021-08-01: 123.4 mg via INTRAVENOUS

## 2021-08-01 MED ORDER — METHOCARBAMOL 1000 MG/10ML IJ SOLN
500.0000 mg | Freq: Four times a day (QID) | INTRAVENOUS | Status: DC | PRN
  Filled 2021-08-01: qty 5

## 2021-08-01 MED ORDER — ROCURONIUM BROMIDE 100 MG/10ML IV SOLN
INTRAVENOUS | Status: DC | PRN
  Administered 2021-08-01: 50 mg via INTRAVENOUS

## 2021-08-01 MED ORDER — LOSARTAN POTASSIUM 25 MG PO TABS
25.0000 mg | ORAL_TABLET | Freq: Every evening | ORAL | Status: DC
  Administered 2021-08-01: 25 mg via ORAL
  Filled 2021-08-01: qty 1

## 2021-08-01 MED ORDER — CARVEDILOL 12.5 MG PO TABS
12.5000 mg | ORAL_TABLET | Freq: Two times a day (BID) | ORAL | Status: DC
  Administered 2021-08-01 – 2021-08-06 (×8): 12.5 mg via ORAL
  Filled 2021-08-01 (×8): qty 1

## 2021-08-01 MED ORDER — LIDOCAINE HCL (CARDIAC) PF 100 MG/5ML IV SOSY
PREFILLED_SYRINGE | INTRAVENOUS | Status: DC | PRN
Start: 2021-08-01 — End: 2021-08-01
  Administered 2021-08-01: 60 mg via INTRAVENOUS

## 2021-08-01 MED ORDER — TIZANIDINE HCL 2 MG PO TABS
2.0000 mg | ORAL_TABLET | Freq: Every evening | ORAL | Status: DC
  Administered 2021-08-01 – 2021-08-05 (×5): 2 mg via ORAL
  Filled 2021-08-01 (×6): qty 1

## 2021-08-01 MED ORDER — ESCITALOPRAM OXALATE 10 MG PO TABS
10.0000 mg | ORAL_TABLET | ORAL | Status: DC
  Administered 2021-08-02 – 2021-08-06 (×5): 10 mg via ORAL
  Filled 2021-08-01 (×5): qty 1

## 2021-08-01 MED ORDER — EZETIMIBE 10 MG PO TABS
10.0000 mg | ORAL_TABLET | Freq: Every day | ORAL | Status: DC
  Administered 2021-08-02 – 2021-08-06 (×5): 10 mg via ORAL
  Filled 2021-08-01 (×5): qty 1

## 2021-08-01 MED ORDER — ENOXAPARIN SODIUM 30 MG/0.3ML IJ SOSY
30.0000 mg | PREFILLED_SYRINGE | Freq: Two times a day (BID) | INTRAMUSCULAR | Status: DC
  Administered 2021-08-02 – 2021-08-06 (×9): 30 mg via SUBCUTANEOUS
  Filled 2021-08-01 (×9): qty 0.3

## 2021-08-01 MED ORDER — ACETAMINOPHEN 325 MG PO TABS: 325.0000 mg | ORAL_TABLET | Freq: Four times a day (QID) | ORAL | Status: DC | PRN

## 2021-08-01 MED ORDER — DEXAMETHASONE SODIUM PHOSPHATE 4 MG/ML IJ SOLN
INTRAMUSCULAR | Status: DC | PRN
  Administered 2021-08-01: 8 mg via INTRAVENOUS

## 2021-08-01 MED ORDER — OXYCODONE HCL 5 MG/5ML PO SOLN: 5.0000 mg | Freq: Once | ORAL | Status: AC | PRN

## 2021-08-01 MED ORDER — OXYCODONE HCL 5 MG PO TABS
ORAL_TABLET | ORAL | Status: AC
  Administered 2021-08-01: 5 mg via ORAL
  Filled 2021-08-01: qty 1

## 2021-08-01 MED ORDER — MORPHINE SULFATE (PF) 10 MG/ML IV SOLN
INTRAVENOUS | Status: AC
  Filled 2021-08-01: qty 1

## 2021-08-01 MED ORDER — PHENOL 1.4 % MT LIQD: 1.0000 | OROMUCOSAL | Status: DC | PRN

## 2021-08-01 MED ORDER — METHOCARBAMOL 500 MG PO TABS
500.0000 mg | ORAL_TABLET | Freq: Four times a day (QID) | ORAL | Status: DC | PRN
  Administered 2021-08-03 – 2021-08-05 (×3): 500 mg via ORAL
  Filled 2021-08-01 (×3): qty 1

## 2021-08-01 MED ORDER — FENTANYL CITRATE (PF) 100 MCG/2ML IJ SOLN
INTRAMUSCULAR | Status: AC
  Administered 2021-08-01: 25 ug via INTRAVENOUS
  Filled 2021-08-01: qty 2

## 2021-08-01 MED ORDER — FENTANYL CITRATE (PF) 100 MCG/2ML IJ SOLN
INTRAMUSCULAR | Status: DC | PRN
  Administered 2021-08-01 (×2): 50 ug via INTRAVENOUS

## 2021-08-01 MED ORDER — OXYCODONE HCL 5 MG PO TABS: 5.0000 mg | ORAL_TABLET | Freq: Once | ORAL | Status: AC | PRN

## 2021-08-01 MED ORDER — DOCUSATE SODIUM 100 MG PO CAPS
100.0000 mg | ORAL_CAPSULE | Freq: Two times a day (BID) | ORAL | Status: DC
  Administered 2021-08-01 – 2021-08-06 (×10): 100 mg via ORAL
  Filled 2021-08-01 (×11): qty 1

## 2021-08-01 MED ORDER — HYDROCODONE-ACETAMINOPHEN 7.5-325 MG PO TABS
1.0000 | ORAL_TABLET | ORAL | Status: DC | PRN
  Administered 2021-08-02 – 2021-08-03 (×2): 1 via ORAL
  Administered 2021-08-04: 2 via ORAL
  Administered 2021-08-04 – 2021-08-06 (×2): 1 via ORAL
  Filled 2021-08-01 (×4): qty 1
  Filled 2021-08-01: qty 2

## 2021-08-01 SURGICAL SUPPLY — 75 items
BLADE SAGITTAL 25.0X1.19X90 (BLADE) ×2 IMPLANT
BLADE SAW 90X13X1.19 OSCILLAT (BLADE) ×1 IMPLANT
BLOCK CUTTING FEMUR 4 LT MED (MISCELLANEOUS) ×1 IMPLANT
BLOCK CUTTING TIBIAL 3 LT (MISCELLANEOUS) ×1 IMPLANT
BNDG ELASTIC 6X5.8 VLCR STR LF (GAUZE/BANDAGES/DRESSINGS) ×2 IMPLANT
CANISTER WOUND CARE 500ML ATS (WOUND CARE) ×2 IMPLANT
CEMENT HV SMART SET (Cement) ×4 IMPLANT
CHLORAPREP W/TINT 26 (MISCELLANEOUS) ×4 IMPLANT
COOLER POLAR GLACIER W/PUMP (MISCELLANEOUS) ×2 IMPLANT
CUFF TOURN SGL QUICK 24 (TOURNIQUET CUFF)
CUFF TOURN SGL QUICK 34 (TOURNIQUET CUFF)
CUFF TRNQT CYL 24X4X16.5-23 (TOURNIQUET CUFF) IMPLANT
CUFF TRNQT CYL 34X4.125X (TOURNIQUET CUFF) IMPLANT
DRAPE 3/4 80X56 (DRAPES) ×4 IMPLANT
DRSG MEPILEX SACRM 8.7X9.8 (GAUZE/BANDAGES/DRESSINGS) ×2 IMPLANT
ELECT CAUTERY BLADE 6.4 (BLADE) ×2 IMPLANT
ELECT REM PT RETURN 9FT ADLT (ELECTROSURGICAL) ×2
ELECTRODE REM PT RTRN 9FT ADLT (ELECTROSURGICAL) ×1 IMPLANT
FEMORAL COMPONENT PS L S4N (Femur) ×1 IMPLANT
FEMUR BONE MODEL 4.9010 MEDACT (MISCELLANEOUS) ×1 IMPLANT
GAUZE SPONGE 4X4 12PLY STRL (GAUZE/BANDAGES/DRESSINGS) ×2 IMPLANT
GAUZE XEROFORM 1X8 LF (GAUZE/BANDAGES/DRESSINGS) ×1 IMPLANT
GLOVE SURG ORTHO LTX SZ8 (GLOVE) ×2 IMPLANT
GLOVE SURG SYN 9.0  PF PI (GLOVE) ×1
GLOVE SURG SYN 9.0 PF PI (GLOVE) ×1 IMPLANT
GLOVE SURG UNDER LTX SZ8 (GLOVE) ×2 IMPLANT
GLOVE SURG UNDER POLY LF SZ9 (GLOVE) ×2 IMPLANT
GOWN SRG 2XL LVL 4 RGLN SLV (GOWNS) ×1 IMPLANT
GOWN STRL NON-REIN 2XL LVL4 (GOWNS) ×1
GOWN STRL REUS W/ TWL LRG LVL3 (GOWN DISPOSABLE) ×1 IMPLANT
GOWN STRL REUS W/ TWL XL LVL3 (GOWN DISPOSABLE) ×1 IMPLANT
GOWN STRL REUS W/TWL LRG LVL3 (GOWN DISPOSABLE) ×1
GOWN STRL REUS W/TWL XL LVL3 (GOWN DISPOSABLE) ×1
HOLDER FOLEY CATH W/STRAP (MISCELLANEOUS) ×1 IMPLANT
HOOD PEEL AWAY FLYTE STAYCOOL (MISCELLANEOUS) ×2 IMPLANT
IV NS IRRIG 3000ML ARTHROMATIC (IV SOLUTION) ×2 IMPLANT
KIT PREVENA INCISION MGT20CM45 (CANNISTER) ×2 IMPLANT
KIT TURNOVER KIT A (KITS) ×2 IMPLANT
KNEE TIBIAL INSERT FXD 14MM S3 (Insert) ×1 IMPLANT
MANIFOLD NEPTUNE II (INSTRUMENTS) ×3 IMPLANT
NDL SAFETY ECLIPSE 18X1.5 (NEEDLE) ×1 IMPLANT
NDL SPNL 18GX3.5 QUINCKE PK (NEEDLE) ×1 IMPLANT
NDL SPNL 20GX3.5 QUINCKE YW (NEEDLE) ×1 IMPLANT
NEEDLE HYPO 18GX1.5 SHARP (NEEDLE)
NEEDLE SPNL 18GX3.5 QUINCKE PK (NEEDLE) IMPLANT
NEEDLE SPNL 20GX3.5 QUINCKE YW (NEEDLE) ×2 IMPLANT
NS IRRIG 1000ML POUR BTL (IV SOLUTION) ×2 IMPLANT
PACK TOTAL KNEE (MISCELLANEOUS) ×2 IMPLANT
PAD WRAPON POLAR KNEE (MISCELLANEOUS) ×1 IMPLANT
PATELLA RESURFACING MEDACTA 02 (Bone Implant) ×1 IMPLANT
PENCIL SMOKE EVACUATOR COATED (MISCELLANEOUS) ×1 IMPLANT
PULSAVAC PLUS IRRIG FAN TIP (DISPOSABLE) ×2
SCALPEL PROTECTED #10 DISP (BLADE) ×4 IMPLANT
SOLUTION IRRIG SURGIPHOR (IV SOLUTION) ×1 IMPLANT
SOLUTION PRONTOSAN WOUND 350ML (IRRIGATION / IRRIGATOR) ×1 IMPLANT
STAPLER SKIN PROX 35W (STAPLE) ×2 IMPLANT
STEM EXTENSION 11MMX30MM (Stem) ×1 IMPLANT
SUCTION FRAZIER HANDLE 10FR (MISCELLANEOUS) ×1
SUCTION TUBE FRAZIER 10FR DISP (MISCELLANEOUS) ×1 IMPLANT
SUT DVC 2 QUILL PDO  T11 36X36 (SUTURE) ×1
SUT DVC 2 QUILL PDO T11 36X36 (SUTURE) ×1 IMPLANT
SUT ETHIBOND 2 V 37 (SUTURE) ×1 IMPLANT
SUT V-LOC 90 ABS 3-0 VLT  V-20 (SUTURE) ×1
SUT V-LOC 90 ABS 3-0 VLT V-20 (SUTURE) IMPLANT
SUT V-LOC 90 ABS DVC 3-0 CL (SUTURE) ×1 IMPLANT
SYR 20ML LL LF (SYRINGE) ×2 IMPLANT
SYR 50ML LL SCALE MARK (SYRINGE) ×4 IMPLANT
TIBIAL BONE MODEL LEFT (MISCELLANEOUS) ×1 IMPLANT
TIBIAL TRAY FIXED MEDACTA 0207 (Bone Implant) ×1 IMPLANT
TIP FAN IRRIG PULSAVAC PLUS (DISPOSABLE) ×1 IMPLANT
TOWEL OR 17X26 4PK STRL BLUE (TOWEL DISPOSABLE) ×1 IMPLANT
TOWER CARTRIDGE SMART MIX (DISPOSABLE) ×2 IMPLANT
TRAY FOLEY MTR SLVR 16FR STAT (SET/KITS/TRAYS/PACK) ×1 IMPLANT
WATER STERILE IRR 1000ML POUR (IV SOLUTION) ×2 IMPLANT
WRAPON POLAR PAD KNEE (MISCELLANEOUS) ×2

## 2021-08-01 NOTE — Op Note (Signed)
08/01/2021 ? ?12:04 PM ? ?PATIENT:  [name] Woods  ? ?MRN: [id number] ? ?PRE-OPERATIVE DIAGNOSIS:  Primary localized osteoarthritis of left knee ?  ?POST-OPERATIVE DIAGNOSIS:  Same ?  ?PROCEDURE:  Procedure(s): ?Left TOTAL KNEE ARTHROPLASTY ?  ?SURGEON: Laurene Footman, MD ?  ?ASSISTANTS: Rachelle Hora, PA-C ?  ?ANESTHESIA:   general ?  ?EBL: 150 ?  ?BLOOD ADMINISTERED:none ?  ?DRAINS:  Incisional wound VAC   ?  ?LOCAL MEDICATIONS USED:  MARCAINE    and OTHER Exparel morphine ?  ?SPECIMEN:  No Specimen ?  ?DISPOSITION OF SPECIMEN:  N/A ?  ?COUNTS:  YES ?  ?TOURNIQUET: 40 minutes at 300 mm Hg ?  ?IMPLANTS: Medacta  GMK PS system with 4 and femur, 3 tibia with short stem and 14 mm insert.  Size 2 patella, all components cemented. ?  ?DICTATION: .Dragon Dictation   patient was brought to the operating room and spinal anesthesia was attempted and general anesthesia obtained.  After prepping and draping the left leg in sterile fashion, and after patient identification and timeout procedures were completed, midline skin incision was made followed by medial parapatellar arthrotomy with moderate medial compartment osteoarthritis, advanced patellofemoral arthritis and very severe lateral compartment arthritis, partial synovectomy was also carried out.   The ACL and PCL and fat pad were excised along with anterior horns of the meniscus. The proximal tibia cutting guide from  the [facility] system was applied and the proximal tibia cut carried out.  The distal femoral cut was carried out in a similar fashion   this point there is oozing from the soft tissues and tourniquet was raised the 4 femoral cutting guide applied with anterior posterior and chamfer cuts made.  The posterior horns of the menisci were removed at this point.   Injection of the above medication was carried out after the femoral and tibial cuts were carried out.  The 3 baseplate trial was placed pinned into position and proximal tibial preparation carried  out with drilling hand reaming and the keel punch followed by placement of the 4 femur and sizing the tibial insert size 14 millimeter gave the best fit with stability and full extension.  The distal femoral drill holes were made in the notch cut for the trochlear groove was then carried out with trials were then removed the patella was cut using the patellar cutting guide and it sized to a size 2 after drill holes have been made  The knee was irrigated with pulsatile lavage and the bony surfaces dried the tibial component was cemented into place first.  Excess cement was removed and the polyethylene insert placed with a torque screw placed with a torque screwdriver tightened.  The distal femoral component was placed and the knee was held in extension as the patellar button was clamped into place.  After the cement was set, excess cement was removed and the knee was again irrigated thoroughly thoroughly irrigated.  The tourniquet was let down and hemostasis checked with electrocautery. The arthrotomy was repaired with a heavy Quill suture,  followed by 3-0 V lock subcuticular closure, skin staples followed by incisional wound VAC and Polar Care.. ?  ?PLAN OF CARE: Admit for overnight observation ?  ?PATIENT DISPOSITION:  PACU - hemodynamically stable. ?  ?  ?   ?  ?  ?  ? ? ?

## 2021-08-01 NOTE — H&P (Signed)
?Chief Complaint  ?Patient presents with  ? Pre-op Exam  ?Scheduled for Left TKA 08/01/21 with Dr. Rudene Christians  ? ?[name] Woods is a 81 y.o. female who presents today for history and physical for left total knee arthroplasty with Dr. Hessie Knows on 08/01/2021. Patient has advanced left knee osteoarthritis with significant valgus deformity and complete loss of joint space with erosion of the lateral compartment. Patient has severe left knee pain that interferes with her ability to walk. She has to walk with a walker and her knee will frequently buckle and give way. She has had no relief with conservative treatment consisting of cortisone injections, bracing and activity modification and over-the-counter medications. Patient's pain is progressively been increasing to the point where she is having a hard time walking. ? ?Past Medical History: ?Past Medical History:  ?Diagnosis Date  ? Anxiety  ? Arthritis  ? Cataract cortical, senile  ? GERD without esophagitis 11/25/2015  ? History of bone density study 08/20/06  ? History of Guillain-Barre syndrome 1978  ? History of migraine headaches  ? Hypertension  ? Neuropathy 2014  ? Plantar fasciitis, right  ? Postmenopausal  ? ?Past Surgical History: ?Past Surgical History:  ?Procedure Laterality Date  ? APPENDECTOMY 1950  ? HYSTERECTOMY 1988  ?for endometriosis  ? COLONOSCOPY 12/20/03  ? CATARACT EXTRACTION 2016  ? [place] ANTERIOR 11/07/2014  ?and decompression  ? TREATMENT HIP DISLOCATION W/ACETABULAR WALL/FEMORAL HEAD FRACTURE OPEN Right 11/11/2019  ?Dr. Rudene Christians  ? sacroplasty Bilateral 04/26/2020  ?Dr. Rudene Christians  ? FRACTURE SURGERY  ?Hip  ? history of breast implants  ? ?Past Family History: ?Family History  ?Problem Relation Age of Onset  ? Dementia Mother  ? Osteoarthritis Father  ?None  ? ?Medications: ?Current Outpatient Medications Ordered in Epic  ?Medication Sig Dispense Refill  ? acetaminophen (TYLENOL) 325 MG tablet Take by mouth  ? amoxicillin (AMOXIL) 500 MG  capsule Please take 4 tablets 1 hour prior to dental procedure 4 capsule 0  ? carvediloL (COREG) 12.5 MG tablet Take 1 tablet (12.5 mg total) by mouth 2 (two) times daily with meals 180 tablet 11  ? cholecalciferol (VITAMIN D3) 2,000 unit tablet Take 2,000 Units by mouth once daily  ? escitalopram oxalate (LEXAPRO) 10 MG tablet TAKE 1 TABLET BY MOUTH DAILY 90 tablet 3  ? estradioL (ESTRACE) 0.5 MG tablet TAKE ONE TABLET EVERY DAY 90 tablet 1  ? gabapentin (NEURONTIN) 300 MG capsule Take 1 capsule (300 mg total) by mouth at bedtime 90 capsule 0  ? losartan (COZAAR) 25 MG tablet TAKE 1 TABLET BY MOUTH DAILY 30 tablet 5  ? MAGNESIUM ORAL Take 1 tablet by mouth once daily  ? nitroGLYcerin (NITROSTAT) 0.4 MG SL tablet  ? omeprazole (PRILOSEC) 20 MG DR capsule TAKE 1 CAPSULE BY MOUTH ONCE DAILY 90 capsule 3  ? potassium 99 mg Tab Take 1 tablet by mouth once daily  ? TORsemide (DEMADEX) 10 MG tablet Take 1 tablet (10 mg total) by mouth once daily (Patient taking differently: Take 0.5 mg by mouth once daily) 90 tablet 11  ? TORsemide (DEMADEX) 5 MG tablet TAKE 1 TABLET BY MOUTH DAILY 90 tablet 0  ? ?No current Epic-ordered facility-administered medications on file.  ? ?Allergies: ?Allergies  ?Allergen Reactions  ? Azithromycin Hives and Rash  ? Amlodipine Swelling  ? Prednisone Other (See Comments)  ? ? ?Review of Systems:  ?A comprehensive 14 point ROS was performed, reviewed by me today, and the pertinent orthopaedic findings  are documented in the HPI. ? ?Exam: ?BP (!) 140/66  Ht 157.5 cm ('5\' 2"'$ )  Wt 61.7 kg (136 lb)  LMP (LMP Unknown)  BMI 24.87 kg/m?  ?General:  ?Well developed, well nourished, no apparent distress, normal affect, antalgic gait, she ambulates with a rolling walker. ? ?HEENT: ?Head normocephalic, atraumatic, PERRL.  ? ?Abdomen: ?Soft, non tender, non distended, Bowel sounds present. ? ?Heart: ?Examination of the heart reveals regular, rate, and rhythm. There is no murmur noted on ascultation. There  is a normal apical pulse. ? ?Lungs: ?Lungs are clear to auscultation. There is no wheeze, rhonchi, or crackles. There is normal expansion of bilateral chest walls.  ? ?Left knee ?Examination of the left knee shows valgus deformity partially passively correctable. Patient has 0 to 120 degrees range of motion. No crepitus. Tender along the lateral joint line and nontender along the medial joint line. No swelling or edema throughout the left leg. Left hip moves well with internal ex rotation with no discomfort. ? ?AP lateral sunrise views of the left knee are reviewed by me today 05/03/2021. Impression: Patient has complete loss of joint space in the lateral compartment with severe sclerotic changes along the lateral femoral condyle and lateral tibial plateau with erosion of the lateral tibial plateau. Severe valgus deformity. Patella tracks well the trochlear groove. No evidence of acute bony abnormality or abnormal bony lesions. ? ?Impression: ?Primary osteoarthritis of left knee [M17.12] ?Primary osteoarthritis of left knee (primary encounter diagnosis) ? ?Plan:  ?58. 81 year old female with advanced left knee osteoarthritis. Pain severe and interfering with quality of life and activities daily living. No relief with conservative treatment. Pain has been increasing to the point where it is limiting her mobility. She has agreed and consented to a left total knee arthroplasty with Dr. Hessie Knows on 08/01/2021. ? ?This note was generated in part with voice recognition software and I apologize for any typographical errors that were not detected and corrected. ? ?Feliberto Gottron MPA-C  ? ?Electronically signed by Feliberto Gottron, [place] at 07/24/2021 10:07 AM EDT ? ?Reviewed  H+P. ?No changes noted. ? ?

## 2021-08-01 NOTE — Anesthesia Preprocedure Evaluation (Signed)
Anesthesia Evaluation  ?Patient identified by MRN, date of birth, ID band ?Patient awake ? ? ? ?Reviewed: ?Allergy & Precautions, H&P , NPO status , Patient's Chart, lab work & pertinent test results ? ?History of Anesthesia Complications ?(+) PONV and history of anesthetic complications ? ?Airway ?Mallampati: II ? ?TM Distance: >3 FB ?Neck ROM: limited ? ? ? Dental ? ?(+) Teeth Intact, Dental Advidsory Given ?  ?Pulmonary ?neg pulmonary ROS, neg shortness of breath, neg COPD,  ?  ?Pulmonary exam normal ?breath sounds clear to auscultation ? ? ? ? ? ? Cardiovascular ?hypertension, Pt. on medications ?(-) angina(-) Past MI and (-) Cardiac Stents Normal cardiovascular exam(-) dysrhythmias (-) Valvular Problems/Murmurs ?Rhythm:regular Rate:Normal ? ? ?  ?Neuro/Psych ?neg Seizures PSYCHIATRIC DISORDERS Anxiety Peripheral neuropathy - numbness b/l lower legs ?H/o spinal stenosis s/p laminectomy ?H/o Guillan-Barre Syndrome ? Neuromuscular disease   ? GI/Hepatic ?Neg liver ROS, GERD  Controlled,  ?Endo/Other  ?negative endocrine ROS ? Renal/GU ?negative Renal ROS  ? ?  ?Musculoskeletal ? ? Abdominal ?  ?Peds ? Hematology ?negative hematology ROS ?(+)   ?Anesthesia Other Findings ?Past Medical History: ?No date: Chest pain ?    Comment:  a. 10/2014 [facility]: nl LV fxn, no wma/ischemia. ?No date: Guillain Barr? syndrome ([place]) ?1980: Guillain Barr? syndrome ([place]) ?No date: Hypertension ?No date: Spinal stenosis ? ?Past Surgical History: ?No date: ABDOMINAL HYSTERECTOMY ?2006: AUGMENTATION MAMMAPLASTY ?No date: BACK SURGERY ?2016: CERVICAL SPINE SURGERY; N/A ?    Comment:  for myelopathy ?No date: LUMBAR DISC SURGERY ? ?BMI   ? Body Mass Index: 24.80 kg/m?  ?  ? ? Reproductive/Obstetrics ?negative OB ROS ? ?  ? ? ? ? ? ? ? ? ? ? ? ? ? ?  ?  ? ? ? ? ? ? ? ? ?Anesthesia Physical ? ?Anesthesia Plan ? ?ASA: 2 ? ?Anesthesia Plan: Spinal  ? ?Post-op Pain Management: Ofirmev IV (intra-op)*   ? ?Induction: Intravenous ? ?PONV Risk Score and Plan: 3 and Treatment may vary due to age or medical condition, Propofol infusion, TIVA and Ondansetron ? ?Airway Management Planned: Natural Airway and Simple Face Mask ? ?Additional Equipment: None ? ?Intra-op Plan:  ? ?Post-operative Plan:  ? ?Informed Consent: I have reviewed the patients History and Physical, chart, labs and discussed the procedure including the risks, benefits and alternatives for the proposed anesthesia with the patient or authorized representative who has indicated his/her understanding and acceptance.  ? ? ? ?Dental Advisory Given ? ?Plan Discussed with: Anesthesiologist, CRNA and Surgeon ? ?Anesthesia Plan Comments: (Discussed R/B/A of neuraxial anesthesia technique with patient: ?- rare risks of spinal/epidural hematoma, nerve damage, infection ?- Risk of PDPH ?- Risk of nausea and vomiting ?- Risk of conversion to general anesthesia and its associated risks, including sore throat, damage to lips/eyes/teeth/oropharynx, and rare risks such as cardiac and respiratory events. ?- Risk of allergic reactions ? ?Discussed the role of CRNA in patient's perioperative care. ? ?Patient voiced understanding.)  ? ? ? ? ? ? ?Anesthesia Quick Evaluation ? ?

## 2021-08-01 NOTE — Transfer of Care (Signed)
Immediate Anesthesia Transfer of Care Note ? ?Patient: [name] Woods ? ?Procedure(s) Performed: TOTAL KNEE ARTHROPLASTY (Left: Knee) ? ?Patient Location: PACU ? ?Anesthesia Type:General ? ?Level of Consciousness: sedated ? ?Airway & Oxygen Therapy: Patient Spontanous Breathing and Patient connected to face mask oxygen ? ?Post-op Assessment: Report given to RN and Post -op Vital signs reviewed and stable ? ?Post vital signs: Reviewed and stable ? ?Last Vitals:  ?Vitals Value Taken Time  ?BP    ?Temp    ?Pulse 64 08/01/21 1154  ?Resp 16 08/01/21 1154  ?SpO2 100 % 08/01/21 1154  ?Vitals shown include unvalidated device data. ? ?Last Pain:  ?Vitals:  ? 08/01/21 0810  ?TempSrc: Temporal  ?PainSc: 0-No pain  ?   ? ?  ? ?Complications: No notable events documented. ?

## 2021-08-01 NOTE — Plan of Care (Signed)

## 2021-08-01 NOTE — TOC Progression Note (Signed)
Transition of Care (TOC) - Progression Note  ? ? ?Patient Details  ?Name: Marlaina Coburn ?MRN: [id number] ?Date of Birth: [birth year] ? ?Transition of Care (TOC) CM/SW Contact  ?Jobeth Pangilinan A Haiven Nardone, LCSW ?Phone Number: ?08/01/2021, 2:51 PM ? ?Clinical Narrative:   [place] arranged with [facility] through pt's Surgeons office prior to surgery.  ? ? ? ?  ?  ? ?Expected Discharge Plan and Services ?  ?  ?  ?  ?  ?                ?  ?  ?  ?  ?  ?  ?  ?  ?  ?  ? ? ?Social Determinants of Health (SDOH) Interventions ?  ? ?Readmission Risk Interventions ?   ? View : No data to display.  ?  ?  ?  ? ? ?

## 2021-08-01 NOTE — Anesthesia Postprocedure Evaluation (Signed)
Anesthesia Post Note ? ?Patient: [name] Woods ? ?Procedure(s) Performed: TOTAL KNEE ARTHROPLASTY (Left: Knee) ? ?Patient location during evaluation: PACU ?Anesthesia Type: General ?Level of consciousness: awake and alert ?Pain management: pain level controlled ?Vital Signs Assessment: post-procedure vital signs reviewed and stable ?Respiratory status: spontaneous breathing, nonlabored ventilation, respiratory function stable and patient connected to nasal cannula oxygen ?Cardiovascular status: blood pressure returned to baseline and stable ?Postop Assessment: no apparent nausea or vomiting ?Anesthetic complications: no ? ? ?No notable events documented. ? ? ?Last Vitals:  ?Vitals:  ? 08/01/21 1300 08/01/21 1315  ?BP: (!) 130/52 (!) 147/57  ?Pulse: 63 67  ?Resp: 13 12  ?Temp:    ?SpO2: 97% 100%  ?  ?Last Pain:  ?Vitals:  ? 08/01/21 1319  ?TempSrc:   ?PainSc: 4   ? ? ?  ?  ?  ?  ?  ?  ? ?Arita Miss ? ? ? ? ?

## 2021-08-01 NOTE — Evaluation (Signed)
Physical Therapy Evaluation ?Patient Details ?Name: [name] Woods ?MRN: [id number] ?DOB: [birth year] ?Today's Date: 08/01/2021 ? ?History of Present Illness ? 81yo female s/p L TKA 08/01/21. PMHx includes anxiety, GERD, hx Guillian-Barre syndrome (1978), HTN, migraine headaches, hx cervical and lumbar spinal surgeries, and neuropathy. ?  ?Clinical Impression ? Pt resting in bed upon PT entrance into room for evaluation today. Pt is A&Ox4; does not report quantitative pain value at rest, but noticeable grimacing throughout session. Pt reports she lives at [facility] and is independent w/ all ADLs and uses a rollator at baseline. ? ?Pt is able to complete LE exercises in supine (see "Total Joint Exercise" section) prior to mobility w/o any complaints of negative symptoms. She is able to perform bed mobility w/ SUPERVISION and increased time w/ reliance on bed rails to sit EOB. Requires modA and RW to progress to standing, but upon attempting to step-pivot to recliner she unable to move RLE to transfer and states she is feeling dizzy/weak, requesting to return to bed. Pt was notably alert/lethargic throughout session, family at bedside reported that she has been dozing off whenever she got the chance to get some quietness. RN notified about possible orthostatics due to c/o dizziness; RN did state she just received some medication that could be the result of the dizziness as well. Pt left in bed w/ all needs w/in reach prior to PT exiting room. Pt will benefit from continued skilled PT in order to increase LE strength, improve mobility/gait, and restore PLOF. Current discharge recommendation to SNF is appropriate due to the level of assistance required by the patient to ensure safety and improve overall function. ?  ?   ? ?Recommendations for follow up therapy are one component of a multi-disciplinary discharge planning process, led by the attending physician.  Recommendations may be  updated based on patient status, additional functional criteria and insurance authorization. ? ?Follow Up Recommendations Skilled nursing-short term rehab (<3 hours/day) ? ?  ?Assistance Recommended at Discharge Intermittent Supervision/Assistance  ?Patient can return home with the following ? A little help with walking and/or transfers;A little help with bathing/dressing/bathroom;Assistance with cooking/housework;Assist for transportation;Help with stairs or ramp for entrance ? ?  ?Equipment Recommendations None recommended by PT  ?Recommendations for Other Services ?    ?  ?Functional Status Assessment Patient has had a recent decline in their functional status and demonstrates the ability to make significant improvements in function in a reasonable and predictable amount of time.  ? ?  ?Precautions / Restrictions Precautions ?Precautions: Fall ?Restrictions ?Weight Bearing Restrictions: Yes ?LLE Weight Bearing: Weight bearing as tolerated  ? ?  ? ?Mobility ? Bed Mobility ?Overal bed mobility: Needs Assistance ?Bed Mobility: Supine to Sit, Sit to Supine ?  ?  ?Supine to sit: Supervision ?Sit to supine: Min assist ?  ?General bed mobility comments: minA for LE transfer ?  ? ?Transfers ?Overall transfer level: Needs assistance ?Equipment used: Rolling walker (2 wheels) ?Transfers: Sit to/from Stand ?Sit to Stand: Mod assist ?  ?  ?  ?  ?  ?General transfer comment: modA due to increased pain/weakness ?  ? ?Ambulation/Gait ?  ?  ?  ?  ?  ?  ?  ?General Gait Details: deferred due to pain and c/o dizziness ? ?Stairs ?  ?  ?  ?  ?  ? ?Wheelchair Mobility ?  ? ?Modified Rankin (Stroke Patients Only) ?  ? ?  ? ?Balance Overall balance assessment: Needs assistance ?  Sitting-balance support: Feet supported, Bilateral upper extremity supported ?Sitting balance-Leahy Scale: Fair ?  ?  ?Standing balance support: Bilateral upper extremity supported, During functional activity, Reliant on assistive device for balance ?Standing  balance-Leahy Scale: Fair ?  ?  ?  ?  ?  ?  ?  ?  ?  ?  ?  ?  ?   ? ? ? ?Pertinent Vitals/Pain Pain Assessment ?Pain Assessment: Faces ?Faces Pain Scale: Hurts even more ?Pain Descriptors / Indicators: Aching, Sore, Grimacing, Discomfort ?Pain Intervention(s): Limited activity within patient's tolerance, Monitored during session, Ice applied, Premedicated before session  ? ? ?Home Living Family/patient expects to be discharged to:: Skilled nursing facility ?Living Arrangements: Alone ?  ?  ?  ?  ?  ?  ?  ?  ?Additional Comments: Pt reports plan to discharge to [facility] for short term rehab; Pt lives in [place] at baseline, 2 story cottage with level entry and can live on the main floor with walk-in shower and comfort height toilets. Has rollator, shower chair, HH showerhead, reacher, sock aide, BSC.  ?  ?Prior Function Prior Level of Function : Independent/Modified Independent ?  ?  ?  ?  ?  ?  ?  ?  ?  ? ? ?Hand Dominance  ?   ? ?  ?Extremity/Trunk Assessment  ? Upper Extremity Assessment ?Upper Extremity Assessment: Overall WFL for tasks assessed;Generalized weakness ?  ? ?Lower Extremity Assessment ?Lower Extremity Assessment: Overall WFL for tasks assessed;Generalized weakness ?  ? ?   ?Communication  ?    ?Cognition Arousal/Alertness: Awake/alert, Lethargic ?Behavior During Therapy: [facility] for tasks assessed/performed ?Overall Cognitive Status: Within Functional Limits for tasks assessed ?  ?  ?  ?  ?  ?  ?  ?  ?  ?  ?  ?  ?  ?  ?  ?  ?General Comments: becomes lethargic throughout session; Per family at bedside "she has been dozing off every chance she gets some quiet"; Per RN pt just received medications that could be a result of the increased lethargy. ?  ?  ? ?  ?General Comments   ? ?  ?Exercises Total Joint Exercises ?Quad Sets: AROM, Both, 10 reps ?Heel Slides: 10 reps, AAROM, Left ?Straight Leg Raises: Left, 10 reps, AAROM  ? ?Assessment/Plan  ?  ?PT Assessment Patient needs continued PT services   ?PT Problem List Decreased strength;Decreased mobility;Decreased range of motion;Decreased coordination;Decreased activity tolerance;Decreased balance;Pain ? ?   ?  ?PT Treatment Interventions DME instruction;Therapeutic exercise;Gait training;Balance training;Stair training;Neuromuscular re-education;Functional mobility training;Therapeutic activities;Patient/family education   ? ?PT Goals (Current goals can be found in the Care Plan section)  ?Acute Rehab PT Goals ?Patient Stated Goal: to get better to go home ?PT Goal Formulation: With patient ?Time For Goal Achievement: 08/15/21 ?Potential to Achieve Goals: Fair ? ?  ?Frequency BID ?  ? ? ?Co-evaluation   ?  ?  ?  ?  ? ? ?  ?AM-PAC PT "6 Clicks" Mobility  ?Outcome Measure Help needed turning from your back to your side while in a flat bed without using bedrails?: A Little ?Help needed moving from lying on your back to sitting on the side of a flat bed without using bedrails?: A Little ?Help needed moving to and from a bed to a chair (including a wheelchair)?: A Lot ?Help needed standing up from a chair using your arms (e.g., wheelchair or bedside chair)?: A Lot ?Help needed to walk in  hospital room?: A Lot ?Help needed climbing 3-5 steps with a railing? : Total ?6 Click Score: 13 ? ?  ?End of Session Equipment Utilized During Treatment: Gait belt ?Activity Tolerance: Patient limited by lethargy;Patient limited by pain ?Patient left: in bed;with call bell/phone within reach;with bed alarm set ?Nurse Communication: Mobility status ?PT Visit Diagnosis: Unsteadiness on feet (R26.81);Pain;Muscle weakness (generalized) (M62.81) ?Pain - Right/Left: Left ?Pain - part of body: Knee ?  ? ?Time: 5992-3414 ?PT Time Calculation (min) (ACUTE ONLY): 32 min ? ? ?Charges:     ?  ?  ?   ? ?Jonnie Kind, SPT ?08/01/2021, 4:09 PM ? ?

## 2021-08-01 NOTE — Anesthesia Procedure Notes (Addendum)
Procedure Name: Intubation ?Date/Time: 08/01/2021 10:22 AM ?Performed by: Nelda Marseille, CRNA ?Pre-anesthesia Checklist: Patient identified, Patient being monitored, Timeout performed, Emergency Drugs available and Suction available ?Patient Re-evaluated:Patient Re-evaluated prior to induction ?Oxygen Delivery Method: Circle system utilized ?Preoxygenation: Pre-oxygenation with 100% oxygen ?Induction Type: IV induction ?Ventilation: Mask ventilation without difficulty ?Laryngoscope Size: Mac, 3 and McGraph ?Grade View: Grade I ?Tube type: Oral ?Tube size: 7.0 mm ?Number of attempts: 1 ?Airway Equipment and Method: Stylet ?Placement Confirmation: ETT inserted through vocal cords under direct vision, positive ETCO2 and breath sounds checked- equal and bilateral ?Secured at: 21 cm ?Tube secured with: Tape ?Dental Injury: Teeth and Oropharynx as per pre-operative assessment  ?Comments: Placed by Lucita Ferrara, SRNA ? ? ? ? ?

## 2021-08-01 NOTE — Plan of Care (Signed)
?  Problem: Education: ?Goal: Knowledge of General Education information will improve ?Description: Including pain rating scale, medication(s)/side effects and non-pharmacologic comfort measures ?Outcome: Progressing ?  ?Problem: Health Behavior/Discharge Planning: ?Goal: Ability to manage health-related needs will improve ?Outcome: Progressing ?  ?Problem: Clinical Measurements: ?Goal: Ability to maintain clinical measurements within normal limits will improve ?Outcome: Progressing ?Goal: Will remain free from infection ?Outcome: Progressing ?Goal: Diagnostic test results will improve ?Outcome: Progressing ?Goal: Respiratory complications will improve ?Outcome: Progressing ?Goal: Cardiovascular complication will be avoided ?Outcome: Progressing ?  ?Problem: Activity: ?Goal: Risk for activity intolerance will decrease ?Outcome: Progressing ?  ?Problem: Nutrition: ?Goal: Adequate nutrition will be maintained ?Outcome: Progressing ?  ?Problem: Coping: ?Goal: Level of anxiety will decrease ?Outcome: Progressing ?  ?Problem: Elimination: ?Goal: Will not experience complications related to bowel motility ?Outcome: Progressing ?Goal: Will not experience complications related to urinary retention ?Outcome: Progressing ?  ?Problem: Pain Managment: ?Goal: General experience of comfort will improve ?Outcome: Progressing ?  ?Problem: Safety: ?Goal: Ability to remain free from injury will improve ?Outcome: Progressing ?  ?Problem: Skin Integrity: ?Goal: Risk for impaired skin integrity will decrease ?Outcome: Progressing ?  ?Problem: Activity: ?Goal: Ability to avoid complications of mobility impairment will improve ?Outcome: Progressing ?Goal: Range of joint motion will improve ?Outcome: Progressing ?  ?Problem: Clinical Measurements: ?Goal: Postoperative complications will be avoided or minimized ?Outcome: Progressing ?  ?Problem: Pain Management: ?Goal: Pain level will decrease with appropriate interventions ?Outcome:  Progressing ?  ?

## 2021-08-02 ENCOUNTER — Encounter: Payer: Self-pay | Admitting: Orthopedic Surgery

## 2021-08-02 DIAGNOSIS — N3941 Urge incontinence: Secondary | ICD-10-CM | POA: Diagnosis present

## 2021-08-02 DIAGNOSIS — Z96659 Presence of unspecified artificial knee joint: Secondary | ICD-10-CM

## 2021-08-02 DIAGNOSIS — D62 Acute posthemorrhagic anemia: Secondary | ICD-10-CM | POA: Diagnosis present

## 2021-08-02 DIAGNOSIS — R339 Retention of urine, unspecified: Secondary | ICD-10-CM | POA: Diagnosis present

## 2021-08-02 DIAGNOSIS — I10 Essential (primary) hypertension: Secondary | ICD-10-CM | POA: Diagnosis present

## 2021-08-02 DIAGNOSIS — E871 Hypo-osmolality and hyponatremia: Secondary | ICD-10-CM | POA: Diagnosis present

## 2021-08-02 DIAGNOSIS — M1712 Unilateral primary osteoarthritis, left knee: Secondary | ICD-10-CM | POA: Diagnosis present

## 2021-08-02 DIAGNOSIS — M21062 Valgus deformity, not elsewhere classified, left knee: Secondary | ICD-10-CM | POA: Diagnosis present

## 2021-08-02 DIAGNOSIS — Z8261 Family history of arthritis: Secondary | ICD-10-CM | POA: Diagnosis not present

## 2021-08-02 DIAGNOSIS — I959 Hypotension, unspecified: Secondary | ICD-10-CM | POA: Diagnosis present

## 2021-08-02 LAB — CBC
HCT: 30 % — ABNORMAL LOW (ref 36.0–46.0)
Hemoglobin: 10.1 g/dL — ABNORMAL LOW (ref 12.0–15.0)
MCH: 31 pg (ref 26.0–34.0)
MCHC: 33.7 g/dL (ref 30.0–36.0)
MCV: 92 fL (ref 80.0–100.0)
Platelets: 249 10*3/uL (ref 150–400)
RBC: 3.26 MIL/uL — ABNORMAL LOW (ref 3.87–5.11)
RDW: 12.9 % (ref 11.5–15.5)
WBC: 16.1 10*3/uL — ABNORMAL HIGH (ref 4.0–10.5)
nRBC: 0 % (ref 0.0–0.2)

## 2021-08-02 LAB — BASIC METABOLIC PANEL
Anion gap: 6 (ref 5–15)
BUN: 16 mg/dL (ref 8–23)
CO2: 28 mmol/L (ref 22–32)
Calcium: 8.7 mg/dL — ABNORMAL LOW (ref 8.9–10.3)
Chloride: 94 mmol/L — ABNORMAL LOW (ref 98–111)
Creatinine, Ser: 0.82 mg/dL (ref 0.44–1.00)
GFR, Estimated: >60 mL/min (ref >60)
Glucose, Bld: 124 mg/dL — ABNORMAL HIGH (ref 70–99)
Potassium: 4.3 mmol/L (ref 3.5–5.1)
Sodium: 128 mmol/L — ABNORMAL LOW (ref 135–145)

## 2021-08-02 MED ORDER — LOSARTAN POTASSIUM 25 MG PO TABS: 25.0000 mg | ORAL_TABLET | Freq: Every evening | ORAL | Status: DC

## 2021-08-02 MED ORDER — FE FUMARATE-B12-VIT C-FA-IFC PO CAPS
1.0000 | ORAL_CAPSULE | Freq: Two times a day (BID) | ORAL | Status: DC
  Administered 2021-08-02 – 2021-08-06 (×9): 1 via ORAL
  Filled 2021-08-02 (×9): qty 1

## 2021-08-02 NOTE — Progress Notes (Signed)
Physical Therapy Treatment ?Patient Details ?Name: [name] Woods ?MRN: [id number] ?DOB: [birth year] ?Today's Date: 08/02/2021 ? ? ?History of Present Illness 81yo female s/p L TKA 08/01/21. PMHx includes anxiety, GERD, hx Guillian-Barre syndrome (1978), HTN, migraine headaches, hx cervical and lumbar spinal surgeries, and neuropathy. ? ?  ?PT Comments  ? ? Pt awake and alert resting in bed upon PT entrance into room for today's session. Pt denies any c/o pain currently at rest and is willing to work w/ PT today. She is able to complete bed mobility to EOB w/ SUPERVISION, once seated EOB she is able to maintain static sitting to take medication from RN. From EOB she progresses to standing w/ CGA using RW, is unable to fully extend/WB on LLE due to c/o of increased pain, but is able to take a few steps to transfer into recliner. Further ambulation/mobility differed due to Pt inability to fully extend LLE due to pain and c/o dizziness once getting ready to sit down in recliner. BP obtained and is 122/57 prior to PT exiting room for today's session. Pt will benefit from continued skilled PT in order to increase LE strength, improve mobility/gait, and restore PLOF. Current discharge recommendation remains appropriate due to the level of assistance required by the patient to ensure safety and improve overall function. ?  ?Recommendations for follow up therapy are one component of a multi-disciplinary discharge planning process, led by the attending physician.  Recommendations may be updated based on patient status, additional functional criteria and insurance authorization. ? ?Follow Up Recommendations ? Skilled nursing-short term rehab (<3 hours/day) ?  ?  ?Assistance Recommended at Discharge Intermittent Supervision/Assistance  ?Patient can return home with the following A little help with walking and/or transfers;A little help with bathing/dressing/bathroom;Assistance with cooking/housework;Assist for  transportation;Help with stairs or ramp for entrance ?  ?Equipment Recommendations ? None recommended by PT  ?  ?Recommendations for Other Services   ? ? ?  ?Precautions / Restrictions Precautions ?Precautions: Fall ?Restrictions ?Weight Bearing Restrictions: Yes ?LLE Weight Bearing: Weight bearing as tolerated  ?  ? ?Mobility ? Bed Mobility ?Overal bed mobility: Needs Assistance ?Bed Mobility: Supine to Sit ?  ?  ?Supine to sit: Supervision ?  ?  ?  ?  ? ?Transfers ?Overall transfer level: Needs assistance ?Equipment used: Rolling walker (2 wheels) ?Transfers: Sit to/from Stand, Bed to chair/wheelchair/BSC ?Sit to Stand: Min guard ?  ?Step pivot transfers: Min guard ?  ?  ?  ?  ?  ? ?Ambulation/Gait ?  ?  ?  ?  ?  ?  ?  ?General Gait Details: deferred, pt unable to full extended LLE due to pain ? ? ?Stairs ?  ?  ?  ?  ?  ? ? ?Wheelchair Mobility ?  ? ?Modified Rankin (Stroke Patients Only) ?  ? ? ?  ?Balance Overall balance assessment: Needs assistance ?Sitting-balance support: Feet supported, Bilateral upper extremity supported ?Sitting balance-Leahy Scale: Fair ?  ?  ?Standing balance support: Bilateral upper extremity supported, During functional activity, Reliant on assistive device for balance ?Standing balance-Leahy Scale: Fair ?  ?  ?  ?  ?  ?  ?  ?  ?  ?  ?  ?  ?  ? ?  ?Cognition Arousal/Alertness: Awake/alert ?Behavior During Therapy: [facility] for tasks assessed/performed ?Overall Cognitive Status: Within Functional Limits for tasks assessed ?  ?  ?  ?  ?  ?  ?  ?  ?  ?  ?  ?  ?  ?  ?  ?  ?  ?  ?  ? ?  ?  Exercises   ? ?  ?General Comments General comments (skin integrity, edema, etc.): Upon arrival to room, pt sitting upright in recliner c/o of dizziness. Vitals obtained when sitting in recliner BP: 92/49 (MAP 61) and HR 69. After seated exercises, BP 96/47 (MAP 64). In standing position, BP 95/48 (MAP 60). Once supine, BP improving to 120/52 (MAP 71) and pt reporting dizziness subsiding. ?  ?  ? ?Pertinent  Vitals/Pain Pain Assessment ?Pain Assessment: Faces ?Faces Pain Scale: Hurts a little bit ?Pain Location: L knee ?Pain Descriptors / Indicators: Aching, Sore, Grimacing, Discomfort ?Pain Intervention(s): Limited activity within patient's tolerance, Monitored during session, Repositioned, Ice applied  ? ? ?Home Living Family/patient expects to be discharged to:: Skilled nursing facility ?Living Arrangements: Alone ?  ?  ?  ?  ?  ?  ?  ?  ?Additional Comments: Pt reports plan to discharge to [facility] for short term rehab; Pt lives in [place] at baseline, 2 story cottage with level entry and can live on the main floor with walk-in shower and comfort height toilets. Has rollator, shower chair, HH showerhead, reacher, sock aide, BSC.  ?  ?Prior Function    ?  ?  ?   ? ?PT Goals (current goals can now be found in the care plan section) Progress towards PT goals: Progressing toward goals ? ?  ?Frequency ? ? ? BID ? ? ? ?  ?PT Plan Current plan remains appropriate  ? ? ?Co-evaluation   ?  ?  ?  ?  ? ?  ?AM-PAC PT "6 Clicks" Mobility   ?Outcome Measure ? Help needed turning from your back to your side while in a flat bed without using bedrails?: A Little ?Help needed moving from lying on your back to sitting on the side of a flat bed without using bedrails?: A Little ?Help needed moving to and from a bed to a chair (including a wheelchair)?: A Little ?Help needed standing up from a chair using your arms (e.g., wheelchair or bedside chair)?: A Little ?Help needed to walk in hospital room?: A Lot ?Help needed climbing 3-5 steps with a railing? : Total ?6 Click Score: 15 ? ?  ?End of Session Equipment Utilized During Treatment: Gait belt ?Activity Tolerance: Patient limited by pain ?Patient left: with call bell/phone within reach;in chair;with chair alarm set ?Nurse Communication: Mobility status ?PT Visit Diagnosis: Unsteadiness on feet (R26.81);Pain;Muscle weakness (generalized) (M62.81) ?Pain - Right/Left: Left ?Pain  - part of body: Knee ?  ? ? ?Time: 6761-9509 ?PT Time Calculation (min) (ACUTE ONLY): 28 min ? ?Charges:             ?          ? ?Jonnie Kind, SPT ?08/02/2021, 1:08 PM ? ?

## 2021-08-02 NOTE — Progress Notes (Addendum)
Physical Therapy Treatment ?Patient Details ?Name: [name] Woods ?MRN: [id number] ?DOB: [name] 09, 1941 ?Today's Date: 08/02/2021 ? ? ?History of Present Illness 81yo female s/p L TKA 08/01/21. PMHx includes anxiety, GERD, hx Guillian-Barre syndrome (1978), HTN, migraine headaches, hx cervical and lumbar spinal surgeries, and neuropathy. ? ?  ?PT Comments  ? ? Pt awake and alert resting in bed w/ visitor at bedside. Pt does not provide quantitative pain value at rest, but states "I was exhausted after working with you this morning and my BP is all off, can we try tomorrow?". I expressed the importance of doing some sort of exercises w/ me today, especially following post-op knee surgery. Pt expressed willingness to do "General LE Exercises" in bed for today's session. Prior to leaving, I educated to the patient the importance of continued mobility, pt verbalized understanding. Pt will benefit from continued skilled PT in order to increase LE strength, improve mobility/gait, and restore PLOF. Current discharge recommendation remains appropriate due to the level of assistance required by the patient to ensure safety and improve overall function. ?  ?Recommendations for follow up therapy are one component of a multi-disciplinary discharge planning process, led by the attending physician.  Recommendations may be updated based on patient status, additional functional criteria and insurance authorization. ? ?Follow Up Recommendations ? Skilled nursing-short term rehab (<3 hours/day) ?  ?  ?Assistance Recommended at Discharge Intermittent Supervision/Assistance  ?Patient can return home with the following A little help with walking and/or transfers;A little help with bathing/dressing/bathroom;Assistance with cooking/housework;Assist for transportation;Help with stairs or ramp for entrance ?  ?Equipment Recommendations ? None recommended by PT  ?  ?Recommendations for Other Services   ? ? ?  ?Precautions / Restrictions  Precautions ?Precautions: Fall ?Restrictions ?Weight Bearing Restrictions: Yes ?LLE Weight Bearing: Weight bearing as tolerated  ?  ? ?Mobility ? Bed Mobility ? ?  ?General bed mobility comments: deferred ?  ? ?Transfers ? ?  ? ?Ambulation/Gait ?  ?  ?  ?  ?  ?  ?  ?Stairs ?  ?  ?  ?  ?  ? ? ?Wheelchair Mobility ?  ? ?Modified Rankin (Stroke Patients Only) ?  ? ? ?  ?Balance  ?  ? ?  ?  ?  ?  ?  ?  ?  ?  ?  ?  ?  ?  ? ?  ?Cognition Arousal/Alertness: Awake/alert ?Behavior During Therapy: [facility] for tasks assessed/performed ?Overall Cognitive Status: Within Functional Limits for tasks assessed ?  ?  ?  ?  ?  ?  ?  ?  ?  ?  ?  ?  ?  ?  ?  ?  ?  ?  ?  ? ?  ?Exercises Total Joint Exercises ?Quad Sets: AROM, Both, 10 reps ?Hip ABduction/ADduction: AROM, 10 reps, Left ?Straight Leg Raises: AROM, Left, 10 reps ?Goniometric ROM: 95deg flexion ? ?  ?General Comments  ?  ?  ? ?Pertinent Vitals/Pain Pain Assessment ?Pain Assessment: Faces ?Faces Pain Scale: Hurts a little bit ?Pain Location: L knee ?Pain Descriptors / Indicators: Aching, Sore, Grimacing, Discomfort ?Pain Intervention(s): Limited activity within patient's tolerance, Monitored during session, Repositioned, Ice applied  ? ? ?Home Living Family/patient expects to be discharged to:: Skilled nursing facility ?Living Arrangements: Alone ?  ?  ?  ?  ?  ?  ?  ?  ?Additional Comments: Pt reports plan to discharge to [facility] for short term rehab; Pt lives in [place]  at baseline, 2 story cottage with level entry and can live on the main floor with walk-in shower and comfort height toilets. Has rollator, shower chair, HH showerhead, reacher, sock aide, BSC.  ?  ?Prior Function    ?  ?  ?   ? ?PT Goals (current goals can now be found in the care plan section) Progress towards PT goals: Progressing toward goals ? ?  ?Frequency ? ? ? BID ? ? ? ?  ?PT Plan Current plan remains appropriate  ? ? ?Co-evaluation   ?  ?  ?  ?  ? ?  ?AM-PAC PT "6 Clicks" Mobility   ?Outcome  Measure ? Help needed turning from your back to your side while in a flat bed without using bedrails?: A Little ?Help needed moving from lying on your back to sitting on the side of a flat bed without using bedrails?: A Little ?Help needed moving to and from a bed to a chair (including a wheelchair)?: A Little ?Help needed standing up from a chair using your arms (e.g., wheelchair or bedside chair)?: A Little ?Help needed to walk in hospital room?: A Lot ?Help needed climbing 3-5 steps with a railing? : Total ?6 Click Score: 15 ? ?  ?End of Session Equipment Utilized During Treatment: Gait belt ?Activity Tolerance: Patient limited by pain ?Patient left: in bed;with call bell/phone within reach;with bed alarm set;with family/visitor present ?Nurse Communication: Mobility status ?PT Visit Diagnosis: Unsteadiness on feet (R26.81);Pain;Muscle weakness (generalized) (M62.81) ?Pain - Right/Left: Left ?Pain - part of body: Knee ?  ? ? ?Time: 7408-1448 ?PT Time Calculation (min) (ACUTE ONLY): 15 min ? ?Charges:  $Therapeutic Exercise: 8-22 mins ?$Therapeutic Activity: 8-22 mins          ?          ? ? ?Jonnie Kind, SPT ?08/02/2021, 3:00 PM ? ?

## 2021-08-02 NOTE — TOC Progression Note (Signed)
Transition of Care (TOC) - Progression Note  ? ? ?Patient Details  ?Name: [name] Woods ?MRN: [id number] ?Date of Birth: 03/28/1941 ? ?Transition of Care (TOC) CM/SW Contact  ?Nereyda Bowler A Yi Haugan, LCSW ?Phone Number: ?08/02/2021, 10:49 AM ? ?Clinical Narrative:   Pt from [place], pt will return to the SNF.  ? ? ? ?  ?  ? ?Expected Discharge Plan and Services ?  ?  ?  ?  ?  ?                ?  ?  ?  ?  ?  ?  ?  ?  ?  ?  ? ? ?Social Determinants of Health (SDOH) Interventions ?  ? ?Readmission Risk Interventions ?   ? View : No data to display.  ?  ?  ?  ? ? ?

## 2021-08-02 NOTE — Plan of Care (Signed)
?  Problem: Education: ?Goal: Knowledge of General Education information will improve ?Description: Including pain rating scale, medication(s)/side effects and non-pharmacologic comfort measures ?Outcome: Progressing ?  ?Problem: Health Behavior/Discharge Planning: ?Goal: Ability to manage health-related needs will improve ?Outcome: Progressing ?  ?Problem: Clinical Measurements: ?Goal: Ability to maintain clinical measurements within normal limits will improve ?Outcome: Progressing ?Goal: Will remain free from infection ?Outcome: Progressing ?Goal: Diagnostic test results will improve ?Outcome: Progressing ?Goal: Respiratory complications will improve ?Outcome: Progressing ?Goal: Cardiovascular complication will be avoided ?Outcome: Progressing ?  ?Problem: Activity: ?Goal: Risk for activity intolerance will decrease ?Outcome: Progressing ?  ?Problem: Nutrition: ?Goal: Adequate nutrition will be maintained ?Outcome: Progressing ?  ?Problem: Coping: ?Goal: Level of anxiety will decrease ?Outcome: Progressing ?  ?Problem: Elimination: ?Goal: Will not experience complications related to bowel motility ?Outcome: Progressing ?Goal: Will not experience complications related to urinary retention ?Outcome: Progressing ?  ?Problem: Pain Managment: ?Goal: General experience of comfort will improve ?Outcome: Progressing ?  ?Problem: Safety: ?Goal: Ability to remain free from injury will improve ?Outcome: Progressing ?  ?Problem: Skin Integrity: ?Goal: Risk for impaired skin integrity will decrease ?Outcome: Progressing ?  ?Problem: Activity: ?Goal: Ability to avoid complications of mobility impairment will improve ?Outcome: Progressing ?Goal: Range of joint motion will improve ?Outcome: Progressing ?  ?Problem: Clinical Measurements: ?Goal: Postoperative complications will be avoided or minimized ?Outcome: Progressing ?  ?Problem: Pain Management: ?Goal: Pain level will decrease with appropriate interventions ?Outcome:  Progressing ?  ?

## 2021-08-02 NOTE — Evaluation (Signed)
Occupational Therapy Evaluation ?Patient Details ?Name: [name] Woods ?MRN: [id number] ?DOB: July 20, 1940 ?Today's Date: 08/02/2021 ? ? ?History of Present Illness 81yo female s/p L TKA 08/01/21. PMHx includes anxiety, GERD, hx Guillian-Barre syndrome (1978), HTN, migraine headaches, hx cervical and lumbar spinal surgeries, and neuropathy.  ? ?Clinical Impression ?  ?Pt seen for OT evaluation this date, POD#1 from above surgery. Upon arrival to room, pt sitting upright in recliner agreeable to OT evaluation but c/o of dizziness at rest. Vitals obtained when sitting in recliner BP: 92/49 (MAP 61) and HR 69. After seated exercises, BP 96/47 (MAP 64). Pt required MIN GUARD for stand pivot transfer from recliner to bed and MIN A for bed mobility d/t dizziness, decreased balance, LLE pain, and decreased activity tolerance. Once supine, BP improving to 120/52 (MAP 71) and pt reporting dizziness subsiding.  ? ?At baseline, pt is mod-independent in all ADLs and functional mobility. Pt is eager to return to PLOF with less pain and improved safety and independence. Pt currently requires SET-UP assist for seated UB ADLs and SET-UP assist for bed-level frontal peri-care. Pt instructed in polar care mgt, falls prevention strategies, home/routines modifications, DME/AE for LB bathing and dressing tasks, and compression stocking mgt. Handout provided and pt vocalized familiarity of all education provided (pt reports she had a R TKR earlier this year). Pt would benefit from additional skilled OT services to maximize safety, independence, and minimize falls risk and caregiver burden. Upon discharge, recommend SNF.    ? ?Recommendations for follow up therapy are one component of a multi-disciplinary discharge planning process, led by the attending physician.  Recommendations may be updated based on patient status, additional functional criteria and insurance authorization.  ? ?Follow Up Recommendations ? Skilled nursing-short  term rehab (<3 hours/day)  ?  ?Assistance Recommended at Discharge Frequent or constant Supervision/Assistance  ?Patient can return home with the following A little help with walking and/or transfers;A lot of help with bathing/dressing/bathroom ? ?  ?Functional Status Assessment ? Patient has had a recent decline in their functional status and demonstrates the ability to make significant improvements in function in a reasonable and predictable amount of time.  ?Equipment Recommendations ? Other (comment) (defer to next venue of care)  ?  ?   ?Precautions / Restrictions Precautions ?Precautions: Fall ?Restrictions ?Weight Bearing Restrictions: Yes ?LLE Weight Bearing: Weight bearing as tolerated  ? ?  ? ?Mobility Bed Mobility ?Overal bed mobility: Needs Assistance ?Bed Mobility: Sit to Supine ?  ?  ?  ?Sit to supine: Min assist ?  ?General bed mobility comments: min A for managing LLE ?  ? ?Transfers ?Overall transfer level: Needs assistance ?Equipment used: Rolling walker (2 wheels) ?Transfers: Sit to/from Stand ?Sit to Stand: Min guard ?  ?  ?  ?  ?  ?General transfer comment: MIN GUARD d/t pt reported dizziness ?  ? ?  ?Balance Overall balance assessment: Needs assistance ?Sitting-balance support: Feet supported, Bilateral upper extremity supported ?Sitting balance-Leahy Scale: Fair ?Sitting balance - Comments: Fair sitting balance reaching within BOS ?  ?Standing balance support: Bilateral upper extremity supported, During functional activity, Reliant on assistive device for balance ?Standing balance-Leahy Scale: Fair ?Standing balance comment: Able to maintain static standing balance with MIN GUARD ?  ?  ?  ?  ?  ?  ?  ?  ?  ?  ?  ?   ? ?ADL either performed or assessed with clinical judgement  ? ?ADL Overall ADL's : Needs assistance/impaired ?  ?  ?  ?  ?  ?  ?  ?  ?  ?  ?  ?  ?  ?  ?  ?  ?  ?  ?  ?  General ADL Comments: SET-UP assist for seated UB dressing and SET-UP assist for bed-level anterior peri-care   ? ? ? ?Vision Ability to See in Adequate Light: 0 Adequate ?   ?   ?   ?   ? ?Pertinent Vitals/Pain Pain Assessment ?Pain Assessment: Faces ?Faces Pain Scale: Hurts a little bit ?Pain Location: L knee ?Pain Descriptors / Indicators: Aching, Sore, Grimacing, Discomfort ?Pain Intervention(s): Limited activity within patient's tolerance, Monitored during session, Repositioned  ? ? ? ?   ?Extremity/Trunk Assessment Upper Extremity Assessment ?Upper Extremity Assessment: Overall WFL for tasks assessed ?  ?Lower Extremity Assessment ?Lower Extremity Assessment: Generalized weakness;LLE deficits/detail ?LLE Deficits / Details: s/p LTKR ?  ?  ?  ?Communication Communication ?Communication: No difficulties ?  ?Cognition Arousal/Alertness: Awake/alert ?Behavior During Therapy: [facility] for tasks assessed/performed ?Overall Cognitive Status: Within Functional Limits for tasks assessed ?  ?  ?  ?  ?  ?  ?  ?  ?  ?  ?  ?  ?  ?  ?  ?  ?  ?  ?  ?General Comments  Upon arrival to room, pt sitting upright in recliner c/o of dizziness. Vitals obtained when sitting in recliner BP: 92/49 (MAP 61) and HR 69. After seated exercises, BP 96/47 (MAP 64). In standing position, BP 95/48 (MAP 60). Once supine, BP improving to 120/52 (MAP 71) and pt reporting dizziness subsiding. ? ?  ?Exercises General Exercises - Upper Extremity ?Shoulder Flexion: Strengthening, Both, 10 reps, Seated ?General Exercises - Lower Extremity ?Ankle Circles/Pumps: Strengthening, Both, 20 reps, Seated ?Hip ABduction/ADduction: Strengthening, 10 reps, Seated, Both ?  ?Shoulder Instructions    ? ? ?Home Living Family/patient expects to be discharged to:: Skilled nursing facility ?Living Arrangements: Alone ?  ?  ?  ?  ?  ?  ?  ?  ?  ?  ?  ?  ?  ?  ?  ?Additional Comments: Pt reports plan to discharge to [facility] for short term rehab; Pt lives in [place] at baseline, 2 story cottage with level entry and can live on the main floor with walk-in shower and comfort  height toilets. Has rollator, shower chair, HH showerhead, reacher, sock aide, BSC. ?  ? ?  ?Prior Functioning/Environment Prior Level of Function : Independent/Modified Independent ?  ?  ?  ?  ?  ?  ?  ?ADLs Comments: pt reports being MOD-I with AD for ADLs ?  ? ?  ?  ?OT Problem List: Decreased strength;Decreased range of motion;Decreased activity tolerance;Impaired balance (sitting and/or standing) ?  ?   ?OT Treatment/Interventions: Self-care/ADL training;Therapeutic exercise;DME and/or AE instruction;Therapeutic activities;Patient/family education;Balance training  ?  ?OT Goals(Current goals can be found in the care plan section) Acute Rehab OT Goals ?Patient Stated Goal: to regain independence ?OT Goal Formulation: With patient ?Time For Goal Achievement: 08/16/21 ?Potential to Achieve Goals: Good ?ADL Goals ?Pt Will Perform Grooming: with supervision;standing ?Pt Will Perform Lower Body Dressing: with supervision;with adaptive equipment;sit to/from stand ?Pt Will Transfer to Toilet: with supervision;ambulating;bedside commode  ?OT Frequency: Min 2X/week ?  ? ?   ?AM-PAC OT "6 Clicks" Daily Activity     ?Outcome Measure Help from another person eating meals?: None ?Help from another person taking care of personal grooming?: A Little ?Help from another person toileting, which includes using toliet, bedpan, or urinal?: A Little ?Help from another person bathing (including washing, rinsing, drying)?: A Lot ?Help from another person to put on and taking  off regular upper body clothing?: A Little ?Help from another person to put on and taking off regular lower body clothing?: A Lot ?6 Click Score: 17 ?  ?End of Session Equipment Utilized During Treatment: Rolling walker (2 wheels) ?Nurse Communication: Mobility status;Other (comment) (vitals during session) ? ?Activity Tolerance: Treatment limited secondary to medical complications (Comment) (limited by low BP; RN informed) ?Patient left: in bed;with call  bell/phone within reach;with bed alarm set;with nursing/sitter in room ? ?OT Visit Diagnosis: Unsteadiness on feet (R26.81);Muscle weakness (generalized) (M62.81);Pain ?Pain - Right/Left: Left ?Pain - part of body: Knee

## 2021-08-02 NOTE — NC FL2 (Signed)
?[place] MEDICAID FL2 LEVEL OF CARE SCREENING TOOL  ?  ? ?IDENTIFICATION  ?Patient Name: ?[name] Woods Birthdate: 1940-10-02 Sex: female Admission Date (Current Location): ?08/01/2021  ?[place] and [place] Number: ? [place] ?  Facility and Address:  ?[facility], [address] [place], [place] [address] ?     Provider Number: ?[id number]  ?Attending Physician Name and Address:  ?Hessie Knows, MD ? Relative Name and Phone Number:  ?  ?   ?Current Level of Care: ?Hospital Recommended Level of Care: ?[place] Prior Approval Number: ?  ? ?Date Approved/Denied: ?  PASRR Number: ?[id number] A ? ?Discharge Plan: ?SNF ?  ? ?Current Diagnoses: ?Patient Active Problem List  ? Diagnosis Date Noted  ? S/P TKR (total knee replacement) using cement, left 08/01/2021  ? S/P TKR (total knee replacement) using cement, right 11/08/2020  ? Hip fracture ([place]) 11/10/2019  ? Closed fracture of neck of right femur ([place])   ? Acute hip pain, right   ? Generalized anxiety disorder   ? Widowed 09/23/2019  ? H/O Guillain-Barre syndrome 12/17/2018  ? Lumbar radiculopathy 12/17/2018  ? Walker as ambulation aid 10/02/2018  ? Labile hypertension 09/16/2018  ? Atypical chest pain 09/16/2018  ? Idiopathic peripheral neuropathy 01/16/2018  ? Chills 07/11/2017  ? Essential hypertension 08/03/2016  ? SOB (shortness of breath) 08/03/2016  ? Anxiety 08/03/2016  ? Leg edema 08/03/2016  ? GERD without esophagitis 11/25/2015  ? Health care maintenance 05/30/2015  ? Idiopathic progressive neuropathy 12/13/2013  ? Edema 11/14/2013  ? Menopause 11/14/2013  ? OA (osteoarthritis) 11/14/2013  ? Osteopenia 11/14/2013  ? Stenosis of cervical spine with myelopathy ([place]) 11/14/2013  ? ? ?Orientation RESPIRATION BLADDER Height & Weight   ?  ?Self, Time, Situation, Place ? Normal Continent Weight: 136 lb (61.7 kg) ?Height:  5' 1.5" (156.2 cm)  ?BEHAVIORAL SYMPTOMS/MOOD NEUROLOGICAL BOWEL NUTRITION STATUS  ?     Continent Diet (regular diet, thin liquids)  ?AMBULATORY STATUS COMMUNICATION OF NEEDS Skin   ?Limited Assist Verbally Surgical wounds (closed incision left knee) ?  ?  ?  ?    ?     ?     ? ? ?Personal Care Assistance Level of Assistance  ?Bathing, Feeding, Dressing Bathing Assistance: Limited assistance ?Feeding assistance: Independent ?Dressing Assistance: Limited assistance ?   ? ?Functional Limitations Info  ?Sight, Hearing, Speech Sight Info: Adequate ?Hearing Info: Adequate ?Speech Info: Adequate  ? ? ?SPECIAL CARE FACTORS FREQUENCY  ?PT (By licensed PT), OT (By licensed OT)   ?  ?PT Frequency: 5x ?OT Frequency: 5x ?  ?  ?  ?   ? ? ?Contractures Contractures Info: Not present  ? ? ?Additional Factors Info  ?Code Status, Allergies Code Status Info: full code ?Allergies Info: Azithromycin, Prednisone, Amlodipine ?  ?  ?  ?   ? ?Current Medications (08/02/2021):  This is the current hospital active medication list ?Current Facility-Administered Medications  ?Medication Dose Route Frequency Provider Last Rate Last Admin  ? 0.9 %  sodium chloride infusion   Intravenous Continuous Hessie Knows, MD   Stopped at 08/01/21 2334  ? acetaminophen (TYLENOL) tablet 325-650 mg  325-650 mg Oral Q6H PRN Hessie Knows, MD      ? alum & mag hydroxide-simeth (MAALOX/MYLANTA) 200-200-20 MG/5ML suspension 30 mL  30 mL Oral Q4H PRN Hessie Knows, MD      ? bisacodyl (DULCOLAX) suppository 10 mg  10 mg Rectal Daily PRN Hessie Knows, MD      ?  carvedilol (COREG) tablet 12.5 mg  12.5 mg Oral BID WC Hessie Knows, MD   12.5 mg at 08/02/21 0912  ? cholecalciferol (VITAMIN D3) tablet 5,000 Units  5,000 Units Oral Daily Hessie Knows, MD   5,000 Units at 08/02/21 0911  ? diphenhydrAMINE (BENADRYL) 12.5 MG/5ML elixir 12.5-25 mg  12.5-25 mg Oral Q4H PRN Hessie Knows, MD      ? docusate sodium (COLACE) capsule 100 mg  100 mg Oral BID Hessie Knows, MD   100 mg at 08/02/21 0913  ? enoxaparin (LOVENOX) injection 30 mg  30 mg Subcutaneous  Q12H Hessie Knows, MD   30 mg at 08/02/21 1941  ? escitalopram (LEXAPRO) tablet 10 mg  10 mg Oral Tedra Senegal, MD   10 mg at 08/02/21 7408  ? estradiol (ESTRACE) tablet 0.5 mg  0.5 mg Oral q AM Hessie Knows, MD   0.5 mg at 08/02/21 1448  ? ezetimibe (ZETIA) tablet 10 mg  10 mg Oral Daily Hessie Knows, MD   10 mg at 08/02/21 0911  ? ferrous [id number] C-folic acid (TRINSICON / FOLTRIN) capsule 1 capsule  1 capsule Oral BID Duanne Guess, PA-C   1 capsule at 08/02/21 5027  ? gabapentin (NEURONTIN) capsule 300 mg  300 mg Oral QHS Hessie Knows, MD   300 mg at 08/01/21 2052  ? HYDROcodone-acetaminophen (NORCO) 7.5-325 MG per tablet 1-2 tablet  1-2 tablet Oral Q4H PRN Hessie Knows, MD      ? HYDROcodone-acetaminophen (NORCO/VICODIN) 5-325 MG per tablet 1-2 tablet  1-2 tablet Oral Q4H PRN Hessie Knows, MD   1 tablet at 08/01/21 2224  ? [START ON 08/03/2021] losartan (COZAAR) tablet 25 mg  25 mg Oral QPM Dorise Hiss C, PA-C      ? magnesium oxide (MAG-OX) tablet 400 mg  400 mg Oral Daily Hessie Knows, MD   400 mg at 08/02/21 0912  ? menthol-cetylpyridinium (CEPACOL) lozenge 3 mg  1 lozenge Oral PRN Hessie Knows, MD      ? Or  ? phenol (CHLORASEPTIC) mouth spray 1 spray  1 spray Mouth/Throat PRN Hessie Knows, MD      ? methocarbamol (ROBAXIN) tablet 500 mg  500 mg Oral Q6H PRN Hessie Knows, MD      ? Or  ? methocarbamol (ROBAXIN) 500 mg in dextrose 5 % 50 mL IVPB  500 mg Intravenous Q6H PRN Hessie Knows, MD      ? metoCLOPramide (REGLAN) tablet 5-10 mg  5-10 mg Oral Q8H PRN Hessie Knows, MD      ? Or  ? metoCLOPramide (REGLAN) injection 5-10 mg  5-10 mg Intravenous Q8H PRN Hessie Knows, MD      ? morphine (PF) 4 MG/ML injection 0.52-1 mg  0.52-1 mg Intravenous Q2H PRN Hessie Knows, MD      ? nitroGLYCERIN (NITROSTAT) SL tablet 0.4 mg  0.4 mg Sublingual Q5 min PRN Hessie Knows, MD      ? ondansetron [facility]) tablet 4 mg  4 mg Oral Q6H PRN Hessie Knows, MD      ? Or  ? ondansetron  [facility]) injection 4 mg  4 mg Intravenous Q6H PRN Hessie Knows, MD      ? pantoprazole (PROTONIX) EC tablet 40 mg  40 mg Oral Daily Hessie Knows, MD   40 mg at 08/02/21 7412  ? polyethylene glycol (MIRALAX / GLYCOLAX) packet 17 g  17 g Oral Daily PRN Hessie Knows, MD      ? sodium phosphate (FLEET) 7-19 GM/118ML enema  1 enema  1 enema Rectal Once PRN Hessie Knows, MD      ? tiZANidine (ZANAFLEX) tablet 2 mg  2 mg Oral QPM Hessie Knows, MD   2 mg at 08/01/21 2217  ? torsemide (DEMADEX) tablet 10 mg  10 mg Oral q AM Hessie Knows, MD   10 mg at 08/02/21 0915  ? traMADol (ULTRAM) tablet 50 mg  50 mg Oral Q6H Hessie Knows, MD   50 mg at 08/02/21 0913  ? vitamin B-12 (CYANOCOBALAMIN) tablet 2,000 mcg  2,000 mcg Oral Daily Hessie Knows, MD   2,000 mcg at 08/02/21 7092  ? zinc sulfate capsule 220 mg  220 mg Oral Daily Hessie Knows, MD   220 mg at 08/02/21 0912  ? zolpidem (AMBIEN) tablet 5 mg  5 mg Oral QHS PRN Hessie Knows, MD   5 mg at 08/01/21 2330  ? ? ? ?Discharge Medications: ?Please see discharge summary for a list of discharge medications. ? ?Relevant Imaging Results: ? ?Relevant Lab Results: ? ? ?Additional Information ?HVF:[id number] ? ?Sherrel Ploch A Hipolito Martinezlopez, LCSW ? ? ? ? ?

## 2021-08-02 NOTE — Progress Notes (Signed)
? ?  Subjective: ?1 Day Post-Op Procedure(s) (LRB): ?TOTAL KNEE ARTHROPLASTY (Left) ?Patient reports pain as 5 on 0-10 scale.   ?Patient is well, but has had some minor complaints of hypotension ?Denies any CP, SOB, ABD pain.  No dizziness or lightheadedness ?We will continue therapy today.  ?Plan is to go Skilled nursing facility after hospital stay. ? ?Objective: ?Vital signs in last 24 hours: ?Temp:  [97.2 ?F (36.2 ?C)-98.4 ?F (36.9 ?C)] 97.6 ?F (36.4 ?C) (03/29 7711) ?Pulse Rate:  [61-77] 68 (03/29 0521) ?Resp:  [8-18] 18 (03/29 0521) ?BP: (90-166)/(52-74) 102/54 (03/29 0521) ?SpO2:  [91 %-100 %] 95 % (03/29 0521) ?Weight:  [61.7 kg] 61.7 kg (03/28 0810) ? ?Intake/Output from previous day: ?03/28 0701 - 03/29 0700 ?In: 2700 [P.O.:480; I.V.:1880; IV Piggyback:100] ?Out: 975 [Urine:925; Blood:50] ?Intake/Output this shift: ?No intake/output data recorded. ? ?Recent Labs  ?  08/01/21 ?1518 08/02/21 ?0415  ?HGB 13.0 10.1*  ? ?Recent Labs  ?  08/01/21 ?1518 08/02/21 ?0415  ?WBC 18.0* 16.1*  ?RBC 4.28 3.26*  ?HCT 39.2 30.0*  ?PLT 314 249  ? ?Recent Labs  ?  08/01/21 ?1518 08/02/21 ?0415  ?NA  --  128*  ?K  --  4.3  ?CL  --  94*  ?CO2  --  28  ?BUN  --  16  ?CREATININE 0.53 0.82  ?GLUCOSE  --  124*  ?CALCIUM  --  8.7*  ? ?No results for input(s): LABPT, INR in the last 72 hours. ? ?EXAM ?General - Patient is Alert, Appropriate, and Oriented ?Extremity - Neurovascular intact ?Sensation intact distally ?Intact pulses distally ?Dorsiflexion/Plantar flexion intact ?No cellulitis present ?Compartment soft ?Dressing - dressing C/D/I and no drainage, Prevena intact without drainage ?Motor Function - intact, moving foot and toes well on exam.  ? ?Past Medical History:  ?Diagnosis Date  ? Anxiety   ? Arthritis   ? Cancer [facility])   ? SKIN CA  ? Chest pain   ? a. 10/2014 [facility]: nl LV fxn, no wma/ischemia.  ? GERD (gastroesophageal reflux disease)   ? Guillain Barr? syndrome ([place])   ? Guillain Barr? syndrome ([place]) 1980  ?  Hypertension   ? PONV (postoperative nausea and vomiting)   ? H/O NAUSEA  ? Spinal stenosis   ? ? ?Assessment/Plan:   ?1 Day Post-Op Procedure(s) (LRB): ?TOTAL KNEE ARTHROPLASTY (Left) ?Principal Problem: ?  S/P TKR (total knee replacement) using cement, left ? ?Estimated body mass index is 25.28 kg/m? as calculated from the following: ?  Height as of this encounter: 5' 1.5" (1.562 m). ?  Weight as of this encounter: 61.7 kg. ?Advance diet ?Up with therapy ?Work on bowel movement ?Pain severe last night but better controlled today. ?Hypotensive -continue with IV fluids.  Hold losartan.  Continue to monitor. ?Hyponatremia -sodium 128, at baseline based on historical labs ?Acute postop blood loss anemia -hemoglobin 10.1.  Will start iron supplement  ?Recheck labs in the morning ?Care management to assist with discharge to skilled nursing facility ? ?DVT Prophylaxis - Lovenox, TED hose, and SCDs ?Weight-Bearing as tolerated to left leg ? ? ?T. Rachelle Hora, PA-C ?[place] ?08/02/2021, 7:42 AM ?  ?

## 2021-08-03 LAB — CBC
HCT: 28.4 % — ABNORMAL LOW (ref 36.0–46.0)
Hemoglobin: 9.5 g/dL — ABNORMAL LOW (ref 12.0–15.0)
MCH: 30.4 pg (ref 26.0–34.0)
MCHC: 33.5 g/dL (ref 30.0–36.0)
MCV: 91 fL (ref 80.0–100.0)
Platelets: 217 10*3/uL (ref 150–400)
RBC: 3.12 MIL/uL — ABNORMAL LOW (ref 3.87–5.11)
RDW: 13.2 % (ref 11.5–15.5)
WBC: 11.4 10*3/uL — ABNORMAL HIGH (ref 4.0–10.5)
nRBC: 0 % (ref 0.0–0.2)

## 2021-08-03 LAB — BASIC METABOLIC PANEL
Anion gap: 4 — ABNORMAL LOW (ref 5–15)
BUN: 22 mg/dL (ref 8–23)
CO2: 31 mmol/L (ref 22–32)
Calcium: 8.5 mg/dL — ABNORMAL LOW (ref 8.9–10.3)
Chloride: 92 mmol/L — ABNORMAL LOW (ref 98–111)
Creatinine, Ser: 0.98 mg/dL (ref 0.44–1.00)
GFR, Estimated: 59 mL/min — ABNORMAL LOW (ref >60)
Glucose, Bld: 110 mg/dL — ABNORMAL HIGH (ref 70–99)
Potassium: 4.3 mmol/L (ref 3.5–5.1)
Sodium: 127 mmol/L — ABNORMAL LOW (ref 135–145)

## 2021-08-03 MED ORDER — ENOXAPARIN SODIUM 40 MG/0.4ML IJ SOSY: 40.0000 mg | PREFILLED_SYRINGE | INTRAMUSCULAR | 0 refills | Status: DC

## 2021-08-03 MED ORDER — HYDROCODONE-ACETAMINOPHEN 5-325 MG PO TABS: 1.0000 | ORAL_TABLET | ORAL | 0 refills | Status: DC | PRN

## 2021-08-03 MED ORDER — POLYETHYLENE GLYCOL 3350 17 G PO PACK: 17.0000 g | PACK | Freq: Every day | ORAL | 0 refills | Status: DC | PRN

## 2021-08-03 MED ORDER — DOCUSATE SODIUM 100 MG PO CAPS: 100.0000 mg | ORAL_CAPSULE | Freq: Two times a day (BID) | ORAL | 0 refills | Status: DC

## 2021-08-03 MED ORDER — LOSARTAN POTASSIUM 25 MG PO TABS
25.0000 mg | ORAL_TABLET | Freq: Every evening | ORAL | Status: DC
  Administered 2021-08-04 – 2021-08-05 (×2): 25 mg via ORAL
  Filled 2021-08-03 (×2): qty 1

## 2021-08-03 MED ORDER — BISACODYL 10 MG RE SUPP
10.0000 mg | Freq: Once | RECTAL | Status: AC
  Administered 2021-08-03: 10 mg via RECTAL
  Filled 2021-08-03: qty 1

## 2021-08-03 MED ORDER — TRAMADOL HCL 50 MG PO TABS: 50.0000 mg | ORAL_TABLET | Freq: Four times a day (QID) | ORAL | 0 refills | Status: DC | PRN

## 2021-08-03 MED ORDER — FE FUMARATE-B12-VIT C-FA-IFC PO CAPS: 1.0000 | ORAL_CAPSULE | Freq: Two times a day (BID) | ORAL | Status: DC

## 2021-08-03 NOTE — Discharge Instructions (Signed)
? ?TOTAL KNEE REPLACEMENT POSTOPERATIVE DIRECTIONS ? ?Knee Rehabilitation, Guidelines Following Surgery  ?Results after knee surgery are often greatly improved when you follow the exercise, range of motion and muscle strengthening exercises prescribed by your doctor. Safety measures are also important to protect the knee from further injury. Any time any of these exercises cause you to have increased pain or swelling in your knee joint, decrease the amount until you are comfortable again and slowly increase them. If you have problems or questions, call your caregiver or physical therapist for advice.  ? ?HOME CARE INSTRUCTIONS  ?Remove items at home which could result in a fall. This includes throw rugs or furniture in walking pathways.  ?Continue to use polar care unit on the knee for pain and swelling from surgery. You may notice swelling that will progress down to the foot and ankle.  This is normal after surgery.  Elevate the leg when you are not up walking on it.   ?Continue to use the breathing machine which will help keep your temperature down.  It is common for your temperature to cycle up and down following surgery, especially at night when you are not up moving around and exerting yourself.  The breathing machine keeps your lungs expanded and your temperature down. ?Do not place pillow under knee, focus on keeping the knee STRAIGHT while resting ? ?DIET ?You may resume your previous home diet once your are discharged from the hospital. ? ?DRESSING / WOUND CARE / SHOWERING ?Please remove provena negative pressure dressing on 08/11/2021 and apply honey comb dressing. Keep dressing clean and dry at all times.  ? ?ACTIVITY ?Walk with your walker as instructed. ?Use walker as long as suggested by your caregivers. ?Avoid periods of inactivity such as sitting longer than an hour when not asleep. This helps prevent blood clots.  ?You may resume a sexual relationship in one month or when given the OK by your doctor.   ?You may return to work once you are cleared by your doctor.  ?Do not drive a car for 6 weeks or until released by you surgeon.  ?Do not drive while taking narcotics. ? ?WEIGHT BEARING ?Weight bearing as tolerated with assist device (walker, cane, etc) as directed, use it as long as suggested by your surgeon or therapist, typically at least 4-6 weeks. ? ?POSTOPERATIVE CONSTIPATION PROTOCOL ?Constipation - defined medically as fewer than three stools per week and severe constipation as less than one stool per week. ? ?One of the most common issues patients have following surgery is constipation.  Even if you have a regular bowel pattern at home, your normal regimen is likely to be disrupted due to multiple reasons following surgery.  Combination of anesthesia, postoperative narcotics, change in appetite and fluid intake all can affect your bowels.  In order to avoid complications following surgery, here are some recommendations in order to help you during your recovery period. ? ?Colace (docusate) - Pick up an over-the-counter form of Colace or another stool softener and take twice a day as long as you are requiring postoperative pain medications.  Take with a full glass of water daily.  If you experience loose stools or diarrhea, hold the colace until you stool forms back up.  If your symptoms do not get better within 1 week or if they get worse, check with your doctor. ? ?Dulcolax (bisacodyl) - Pick up over-the-counter and take as directed by the product packaging as needed to assist with the movement of your bowels.  Take with a full glass of water.  Use this product as needed if not relieved by Colace only.  ? ?MiraLax (polyethylene glycol) - Pick up over-the-counter to have on hand.  MiraLax is a solution that will increase the amount of water in your bowels to assist with bowel movements.  Take as directed and can mix with a glass of water, juice, soda, coffee, or tea.  Take if you go more than two days  without a movement. ?Do not use MiraLax more than once per day. Call your doctor if you are still constipated or irregular after using this medication for 7 days in a row. ? ?If you continue to have problems with postoperative constipation, please contact the office for further assistance and recommendations.  If you experience "the worst abdominal pain ever" or develop nausea or vomiting, please contact the office immediatly for further recommendations for treatment. ? ?ITCHING ? If you experience itching with your medications, try taking only a single pain pill, or even half a pain pill at a time.  You can also use Benadryl over the counter for itching or also to help with sleep.  ? ?TED HOSE STOCKINGS ?Wear the elastic stockings on both legs for six weeks following surgery during the day but you may remove then at night for sleeping. ? ?MEDICATIONS ?See your medication summary on the ?After Visit Summary? that the nursing staff will review with you prior to discharge.  You may have some home medications which will be placed on hold until you complete the course of blood thinner medication.  It is important for you to complete the blood thinner medication as prescribed by your surgeon.  Continue your approved medications as instructed at time of discharge. ? ?PRECAUTIONS ?If you experience chest pain or shortness of breath - call 911 immediately for transfer to the hospital emergency department.  ?If you develop a fever greater that 101 F, purulent drainage from wound, increased redness or drainage from wound, foul odor from the wound/dressing, or calf pain - CONTACT YOUR SURGEON.   ?                                                ?FOLLOW-UP APPOINTMENTS ?Make sure you keep all of your appointments after your operation with your surgeon and caregivers. You should call the office at the above phone number and make an appointment for approximately two weeks after the date of your surgery or on the date instructed by  your surgeon outlined in the "After Visit Summary". ? ? ?RANGE OF MOTION AND STRENGTHENING EXERCISES  ?Rehabilitation of the knee is important following a knee injury or an operation. After just a few days of immobilization, the muscles of the thigh which control the knee become weakened and shrink (atrophy). Knee exercises are designed to build up the tone and strength of the thigh muscles and to improve knee motion. Often times heat used for twenty to thirty minutes before working out will loosen up your tissues and help with improving the range of motion but do not use heat for the first two weeks following surgery. These exercises can be done on a training (exercise) mat, on the floor, on a table or on a bed. Use what ever works the best and is most comfortable for you Knee exercises include:  ?Leg Lifts - While your  knee is still immobilized in a splint or cast, you can do straight leg raises. Lift the leg to 60 degrees, hold for 3 sec, and slowly lower the leg. Repeat 10-20 times 2-3 times daily. Perform this exercise against resistance later as your knee gets better.  ?Quad and Hamstring Sets - Tighten up the muscle on the front of the thigh (Quad) and hold for 5-10 sec. Repeat this 10-20 times hourly. Hamstring sets are done by pushing the foot backward against an object and holding for 5-10 sec. Repeat as with quad sets.  ?Leg Slides: Lying on your back, slowly slide your foot toward your buttocks, bending your knee up off the floor (only go as far as is comfortable). Then slowly slide your foot back down until your leg is flat on the floor again. ?Angel Wings: Lying on your back spread your legs to the side as far apart as you can without causing discomfort.  ?A rehabilitation program following serious knee injuries can speed recovery and prevent re-injury in the future due to weakened muscles. Contact your doctor or a physical therapist for more information on knee rehabilitation.  ? ?IF YOU ARE  TRANSFERRED TO A SKILLED REHAB FACILITY ?If the patient is transferred to a skilled rehab facility following release from the hospital, a list of the current medications will be sent to the facility for the patien

## 2021-08-03 NOTE — Progress Notes (Signed)
? ?  Subjective: ?2 Days Post-Op Procedure(s) (LRB): ?TOTAL KNEE ARTHROPLASTY (Left) ?Patient reports pain as mild.   ?Patient is well, but has had some minor complaints of hypotension . Losartan held yesterday, will hold again today. Pain severe at night but patient not getting PRN norco at bedtime. ?Denies any CP, SOB, ABD pain.  No dizziness or lightheadedness ?We will continue therapy today.  ?Plan is to go Skilled nursing facility after hospital stay. ? ?Objective: ?Vital signs in last 24 hours: ?Temp:  [97.7 ?F (36.5 ?C)-98.4 ?F (36.9 ?C)] 97.9 ?F (36.6 ?C) (03/30 0815) ?Pulse Rate:  [67-99] 70 (03/30 0815) ?Resp:  [15-18] 15 (03/30 0815) ?BP: (105-136)/(44-75) 110/46 (03/30 0815) ?SpO2:  [95 %-100 %] 100 % (03/30 0815) ? ?Intake/Output from previous day: ?03/29 0701 - 03/30 0700 ?In: 320 [P.O.:320] ?Out: 250 [Urine:250] ?Intake/Output this shift: ?No intake/output data recorded. ? ?Recent Labs  ?  08/01/21 ?1518 08/02/21 ?6415 08/03/21 ?8309  ?HGB 13.0 10.1* 9.5*  ? ?Recent Labs  ?  08/02/21 ?0415 08/03/21 ?0438  ?WBC 16.1* 11.4*  ?RBC 3.26* 3.12*  ?HCT 30.0* 28.4*  ?PLT 249 217  ? ?Recent Labs  ?  08/02/21 ?0415 08/03/21 ?0438  ?NA 128* 127*  ?K 4.3 4.3  ?CL 94* 92*  ?CO2 28 31  ?BUN 16 22  ?CREATININE 0.82 0.98  ?GLUCOSE 124* 110*  ?CALCIUM 8.7* 8.5*  ? ?No results for input(s): LABPT, INR in the last 72 hours. ? ?EXAM ?General - Patient is Alert, Appropriate, and Oriented ?Extremity - Neurovascular intact ?Sensation intact distally ?Intact pulses distally ?Dorsiflexion/Plantar flexion intact ?No cellulitis present ?Compartment soft ?Dressing - dressing C/D/I and no drainage, Prevena intact without drainage ?Motor Function - intact, moving foot and toes well on exam.  ? ?Past Medical History:  ?Diagnosis Date  ? Anxiety   ? Arthritis   ? Cancer [facility])   ? SKIN CA  ? Chest pain   ? a. 10/2014 [facility]: nl LV fxn, no wma/ischemia.  ? GERD (gastroesophageal reflux disease)   ? Guillain Barr? syndrome ([place])   ?  Guillain Barr? syndrome ([place]) 1980  ? Hypertension   ? PONV (postoperative nausea and vomiting)   ? H/O NAUSEA  ? Spinal stenosis   ? ? ?Assessment/Plan:   ?2 Days Post-Op Procedure(s) (LRB): ?TOTAL KNEE ARTHROPLASTY (Left) ?Principal Problem: ?  S/P TKR (total knee replacement) using cement, left ?Active Problems: ?  S/P TKR (total knee replacement) ? ?Estimated body mass index is 25.28 kg/m? as calculated from the following: ?  Height as of this encounter: 5' 1.5" (1.562 m). ?  Weight as of this encounter: 61.7 kg. ?Advance diet ?Up with therapy ?Work on bowel movement, suppository ordered ?Pain controlled this am ?Hypotensive - Asymptomatic.  Hold losartan.  Continue to monitor. ?Hyponatremia -sodium 127, at baseline based on historical labs ?Acute postop blood loss anemia -hemoglobin 9.5  Continue with iron supplement  ?Recheck labs in the morning ?Care management to assist with discharge to skilled nursing facility, [facility] ? ?DVT Prophylaxis - Lovenox, TED hose, and SCDs ?Weight-Bearing as tolerated to left leg ? ? ?T. Rachelle Hora, PA-C ?[place] ?08/03/2021, 8:36 AM ?  ?

## 2021-08-03 NOTE — Plan of Care (Signed)
?  Problem: Education: ?Goal: Knowledge of General Education information will improve ?Description: Including pain rating scale, medication(s)/side effects and non-pharmacologic comfort measures ?Outcome: Progressing ?  ?Problem: Health Behavior/Discharge Planning: ?Goal: Ability to manage health-related needs will improve ?Outcome: Progressing ?  ?Problem: Clinical Measurements: ?Goal: Ability to maintain clinical measurements within normal limits will improve ?Outcome: Progressing ?Goal: Will remain free from infection ?Outcome: Progressing ?Goal: Diagnostic test results will improve ?Outcome: Progressing ?Goal: Respiratory complications will improve ?Outcome: Progressing ?Goal: Cardiovascular complication will be avoided ?Outcome: Progressing ?  ?Problem: Activity: ?Goal: Risk for activity intolerance will decrease ?Outcome: Progressing ?  ?Problem: Nutrition: ?Goal: Adequate nutrition will be maintained ?Outcome: Progressing ?  ?Problem: Coping: ?Goal: Level of anxiety will decrease ?Outcome: Progressing ?  ?Problem: Elimination: ?Goal: Will not experience complications related to bowel motility ?Outcome: Progressing ?Goal: Will not experience complications related to urinary retention ?Outcome: Progressing ?  ?Problem: Pain Managment: ?Goal: General experience of comfort will improve ?Outcome: Progressing ?  ?Problem: Safety: ?Goal: Ability to remain free from injury will improve ?Outcome: Progressing ?  ?Problem: Skin Integrity: ?Goal: Risk for impaired skin integrity will decrease ?Outcome: Progressing ?  ?Problem: Activity: ?Goal: Ability to avoid complications of mobility impairment will improve ?Outcome: Progressing ?Goal: Range of joint motion will improve ?Outcome: Progressing ?  ?Problem: Clinical Measurements: ?Goal: Postoperative complications will be avoided or minimized ?Outcome: Progressing ?  ?Problem: Pain Management: ?Goal: Pain level will decrease with appropriate interventions ?Outcome:  Progressing ?  ?

## 2021-08-03 NOTE — Progress Notes (Signed)
Physical Therapy Treatment ?Patient Details ?Name: [name] Woods ?MRN: [id number] ?DOB: 1940/09/06 ?Today's Date: 08/03/2021 ? ? ?History of Present Illness 81yo female s/p L TKA 08/01/21. PMHx includes anxiety, GERD, hx Guillian-Barre syndrome (1978), HTN, migraine headaches, hx cervical and lumbar spinal surgeries, and neuropathy. ? ?  ?PT Comments  ? ? Pt was long sitting in bed upon arriving. She is A and O x 4 and agreeable to session. She endorses not sleeping well but was agreeable to OOB activity. Pt's BP 112/61 upon sitting up EOB. C/o slight dizziness that quickly resolved. Performed several exercises EOB and a lot of education about PT post acute setting. Session progressed to pt standing and ambulate ~ 6 ft to recliner. Very slow antalgic step to pattern. No LOB however required step by step instructions for sequencing. Overall pt tolerated session well. She was repositioned in recliner with RN at bedside. PA rounded on pt during session. Plan to DC to SNF ([facility]) when insurance Josem Kaufmann is aquired.  ?  ?Recommendations for follow up therapy are one component of a multi-disciplinary discharge planning process, led by the attending physician.  Recommendations may be updated based on patient status, additional functional criteria and insurance authorization. ? ?Follow Up Recommendations ? Skilled nursing-short term rehab (<3 hours/day) ([facility] skilled side) ?  ?  ?Assistance Recommended at Discharge Frequent or constant Supervision/Assistance  ?Patient can return home with the following A little help with walking and/or transfers;A little help with bathing/dressing/bathroom;Assistance with cooking/housework;Assist for transportation;Help with stairs or ramp for entrance ?  ?Equipment Recommendations ? None recommended by PT  ?  ?   ?Precautions / Restrictions Precautions ?Precautions: Fall ?Restrictions ?Weight Bearing Restrictions: Yes ?LLE Weight Bearing: Weight bearing as tolerated  ?   ? ?Mobility ? Bed Mobility ?Overal bed mobility: Needs Assistance ?Bed Mobility: Supine to Sit ?  ?  ?Supine to sit: Min assist ?  ?  ?General bed mobility comments: min assist to achieve EOB short sit ?  ? ?Transfers ?Overall transfer level: Needs assistance ?Equipment used: Rolling walker (2 wheels) ?Transfers: Sit to/from Stand ?Sit to Stand: Min assist ?  ?  ?  ?  ?  ?General transfer comment: min assist to stand from lowest bed height ?  ? ?Ambulation/Gait ?Ambulation/Gait assistance: Min guard ?Gait Distance (Feet): 6 Feet ?Assistive device: Rolling walker (2 wheels) ?Gait Pattern/deviations: Step-to pattern, Antalgic ?Gait velocity: decreased ?  ?  ?General Gait Details: pt was able to ambulate 6 ft to recliner form EOB. very slow antalgic step to pattern. ? ? ?  ?Balance Overall balance assessment: Needs assistance ?Sitting-balance support: Feet supported, Bilateral upper extremity supported ?Sitting balance-Leahy Scale: Fair ?  ?  ?Standing balance support: Bilateral upper extremity supported, During functional activity, Reliant on assistive device for balance ?Standing balance-Leahy Scale: Fair ?  ?  ?  ?  ?Cognition Arousal/Alertness: Awake/alert ?Behavior During Therapy: [facility] for tasks assessed/performed ?Overall Cognitive Status: Within Functional Limits for tasks assessed ?  ?   ?General Comments: Pt is A and O x 4 ?  ?  ? ?  ?   ?General Comments General comments (skin integrity, edema, etc.): will address ROM, strength, and advance mobility in PM session ?  ?  ? ?Pertinent Vitals/Pain Pain Assessment ?Pain Assessment: 0-10 ?Pain Score: 4  ?Faces Pain Scale: Hurts little more ?Pain Location: L knee ?Pain Descriptors / Indicators: Aching, Sore, Grimacing, Discomfort ?Pain Intervention(s): Limited activity within patient's tolerance, Monitored during session, Premedicated before session, Repositioned,  Ice applied  ? ? ? ?PT Goals (current goals can now be found in the care plan section) Acute Rehab PT  Goals ?Patient Stated Goal: to get better to go home ?Progress towards PT goals: Progressing toward goals ? ?  ?Frequency ? ? ? BID ? ? ? ?  ?PT Plan Current plan remains appropriate  ? ? ?   ?AM-PAC PT "6 Clicks" Mobility   ?Outcome Measure ? Help needed turning from your back to your side while in a flat bed without using bedrails?: A Little ?Help needed moving from lying on your back to sitting on the side of a flat bed without using bedrails?: A Little ?Help needed moving to and from a bed to a chair (including a wheelchair)?: A Little ?Help needed standing up from a chair using your arms (e.g., wheelchair or bedside chair)?: A Little ?Help needed to walk in hospital room?: A Lot ?Help needed climbing 3-5 steps with a railing? : Total ?6 Click Score: 15 ? ?  ?End of Session   ?Activity Tolerance: Patient limited by pain ?Patient left: in chair;with call bell/phone within reach;with chair alarm set;with nursing/sitter in room ?Nurse Communication: Mobility status ?PT Visit Diagnosis: Unsteadiness on feet (R26.81);Pain;Muscle weakness (generalized) (M62.81) ?Pain - Right/Left: Left ?Pain - part of body: Knee ?  ? ? ?Time: 3300-7622 ?PT Time Calculation (min) (ACUTE ONLY): 29 min ? ?Charges:  $Gait Training: 8-22 mins ?$Therapeutic Activity: 8-22 mins          ?          ?Julaine Fusi PTA ?08/03/21, 9:28 AM  ? ?

## 2021-08-03 NOTE — Progress Notes (Signed)
Occupational Therapy Treatment ?Patient Details ?Name: [name] Woods ?MRN: [id number] ?DOB: 1940/10/10 ?Today's Date: 08/03/2021 ? ? ?History of present illness 81yo female s/p L TKA 08/01/21. PMHx includes anxiety, GERD, hx Guillian-Barre syndrome (1978), HTN, migraine headaches, hx cervical and lumbar spinal surgeries, and neuropathy. ?  ?OT comments ? Pt seen for OT tx. Pt initially declining but requesting assist to use the bathroom. Pt required MOD A for bed mobility and instruction in scooting forward with R foot positioning to improve transfer technique. Pt required bed to be elevated this afternoon with MIN A to complete 2/2 L knee pain. Pt completed step pivot transfer to [facility] with MIN A (nurse tech present for safety) and VC for sequencing and RW mgt. Pt requesting additional time for toileting needs. Provided with call bell and instructed to call once ready. Nurse tech notified at end of session. Pt progressing, pain limited this afternoon, but will continue to benefit from skilled OT services. Continue to recommend SNF.   ? ?Recommendations for follow up therapy are one component of a multi-disciplinary discharge planning process, led by the attending physician.  Recommendations may be updated based on patient status, additional functional criteria and insurance authorization. ?   ?Follow Up Recommendations ? Skilled nursing-short term rehab (<3 hours/day)  ?  ?Assistance Recommended at Discharge Frequent or constant Supervision/Assistance  ?Patient can return home with the following ? A little help with walking and/or transfers;A lot of help with bathing/dressing/bathroom;Assistance with cooking/housework;Assist for transportation;Help with stairs or ramp for entrance ?  ?Equipment Recommendations ? Other (comment) (defer to next venue, likely needs [facility])  ?  ?Recommendations for Other Services   ? ?  ?Precautions / Restrictions Precautions ?Precautions: Fall ?Restrictions ?Weight Bearing  Restrictions: Yes ?LLE Weight Bearing: Weight bearing as tolerated  ? ? ?  ? ?Mobility Bed Mobility ?Overal bed mobility: Needs Assistance ?Bed Mobility: Supine to Sit ?  ?  ?Supine to sit: Mod assist ?  ?  ?General bed mobility comments: MOD A, pain limited ?  ? ?Transfers ?Overall transfer level: Needs assistance ?Equipment used: Rolling walker (2 wheels) ?Transfers: Sit to/from Stand ?Sit to Stand: Min assist, From elevated surface ?  ?  ?  ?  ?  ?General transfer comment: MIN A from elevated bed this afternoon 2/2 pain ?  ?  ?Balance Overall balance assessment: Needs assistance ?Sitting-balance support: Feet supported, Bilateral upper extremity supported ?Sitting balance-Leahy Scale: Fair ?  ?  ?Standing balance support: Bilateral upper extremity supported, During functional activity, Reliant on assistive device for balance ?Standing balance-Leahy Scale: Fair ?  ?  ?  ?  ?  ?  ?  ?  ?  ?  ?  ?  ?   ? ?ADL either performed or assessed with clinical judgement  ? ?ADL Overall ADL's : Needs assistance/impaired ?  ?  ?  ?  ?  ?  ?  ?  ?  ?  ?  ?  ?Toilet Transfer: Rolling walker (2 wheels);BSC/3in1;Cueing for safety;Cueing for sequencing ?Toilet Transfer Details (indicate cue type and reason): step pivot to BSC from EOB with MIN A + VC for RW mgt and hand placement ?  ?  ?  ?  ?  ?  ?  ? ?Extremity/Trunk Assessment   ?  ?  ?  ?  ?  ? ?Vision   ?  ?  ?Perception   ?  ?Praxis   ?  ? ?Cognition Arousal/Alertness: Awake/alert ?Behavior During Therapy: [facility] for  tasks assessed/performed ?Overall Cognitive Status: Within Functional Limits for tasks assessed ?  ?  ?  ?  ?  ?  ?  ?  ?  ?  ?  ?  ?  ?  ?  ?  ?  ?  ?  ?   ?Exercises Other Exercises ?Other Exercises: ADL transfer training, RW mgt, falls prevention during ADL mobility ? ?  ?Shoulder Instructions   ? ? ?  ?General Comments    ? ? ?Pertinent Vitals/ Pain       Pain Assessment ?Pain Assessment: 0-10 ?Pain Score: 9  ?Pain Location: L knee ?Pain Descriptors /  Indicators: Aching, Sore, Discomfort ?Pain Intervention(s): Limited activity within patient's tolerance, Monitored during session ? ?Home Living   ?  ?  ?  ?  ?  ?  ?  ?  ?  ?  ?  ?  ?  ?  ?  ?  ?  ?  ? ?  ?Prior Functioning/Environment    ?  ?  ?  ?   ? ?Frequency ? Min 2X/week  ? ? ? ? ?  ?Progress Toward Goals ? ?OT Goals(current goals can now be found in the care plan section) ? Progress towards OT goals: Progressing toward goals ? ?Acute Rehab OT Goals ?Patient Stated Goal: to regain independence ?OT Goal Formulation: With patient ?Time For Goal Achievement: 08/16/21 ?Potential to Achieve Goals: Good  ?Plan Discharge plan remains appropriate;Frequency remains appropriate   ? ?Co-evaluation ? ? ?   ?  ?  ?  ?  ? ?  ?AM-PAC OT "6 Clicks" Daily Activity     ?Outcome Measure ? ? Help from another person eating meals?: None ?Help from another person taking care of personal grooming?: A Little ?Help from another person toileting, which includes using toliet, bedpan, or urinal?: A Little ?Help from another person bathing (including washing, rinsing, drying)?: A Lot ?Help from another person to put on and taking off regular upper body clothing?: A Little ?Help from another person to put on and taking off regular lower body clothing?: A Lot ?6 Click Score: 17 ? ?  ?End of Session Equipment Utilized During Treatment: Rolling walker (2 wheels);Gait belt ? ?OT Visit Diagnosis: Unsteadiness on feet (R26.81);Muscle weakness (generalized) (M62.81);Pain ?Pain - Right/Left: Left ?Pain - part of body: Knee ?  ?Activity Tolerance Patient tolerated treatment well ?  ?Patient Left Other (comment) (seated on BSC with call bell, will call for assist once ready) ?  ?Nurse Communication Other (comment) (nurse tech- pt on [facility] requesting more time for otileting) ?  ? ?   ? ?Time: 3343-5686 ?OT Time Calculation (min): 11 min ? ?Charges: OT General Charges ?$OT Visit: 1 Visit ?OT Treatments ?$Self Care/Home Management : 8-22 mins ? ?Ardeth Perfect., MPH, MS, OTR/L ?ascom [phone] ?08/03/21, 1:49 PM ? ?

## 2021-08-03 NOTE — Discharge Summary (Addendum)
?Physician Discharge Summary  ?Patient ID: ?Welcome ?MRN: [id number] ?DOB/AGE: 1940-07-16 81 y.o. ? ?Admit date: 08/01/2021 ?Discharge date: 08/06/2021 ? ?Admission Diagnoses:  ?S/P TKR (total knee replacement) using cement, left [Z96.652] ?S/P TKR (total knee replacement) [Z96.659] ? ?Discharge Diagnoses: ?Patient Active Problem List  ? Diagnosis Date Noted  ? S/P TKR (total knee replacement) 08/02/2021  ? S/P TKR (total knee replacement) using cement, left 08/01/2021  ? S/P TKR (total knee replacement) using cement, right 11/08/2020  ? Hip fracture ([place]) 11/10/2019  ? Closed fracture of neck of right femur ([place])   ? Acute hip pain, right   ? Generalized anxiety disorder   ? Widowed 09/23/2019  ? H/O Guillain-Barre syndrome 12/17/2018  ? Lumbar radiculopathy 12/17/2018  ? Walker as ambulation aid 10/02/2018  ? Labile hypertension 09/16/2018  ? Atypical chest pain 09/16/2018  ? Idiopathic peripheral neuropathy 01/16/2018  ? Chills 07/11/2017  ? Essential hypertension 08/03/2016  ? SOB (shortness of breath) 08/03/2016  ? Anxiety 08/03/2016  ? Leg edema 08/03/2016  ? GERD without esophagitis 11/25/2015  ? Health care maintenance 05/30/2015  ? Idiopathic progressive neuropathy 12/13/2013  ? Edema 11/14/2013  ? Menopause 11/14/2013  ? OA (osteoarthritis) 11/14/2013  ? Osteopenia 11/14/2013  ? Stenosis of cervical spine with myelopathy ([place]) 11/14/2013  ? ? ?Past Medical History:  ?Diagnosis Date  ? Anxiety   ? Arthritis   ? Cancer [facility])   ? SKIN CA  ? Chest pain   ? a. 10/2014 St Echo: nl LV fxn, no wma/ischemia.  ? GERD (gastroesophageal reflux disease)   ? Guillain Barr? syndrome ([place])   ? Guillain Barr? syndrome ([place]) 1980  ? Hypertension   ? PONV (postoperative nausea and vomiting)   ? H/O NAUSEA  ? Spinal stenosis   ? ?  ?Transfusion: None ?  ?Consultants (if any):  ? ?Discharged Condition: Improved ? ?Hospital Course: Isbella Arline Albano is an 81 y.o. female who was admitted 08/01/2021 with a  diagnosis of S/P TKR (total knee replacement) using cement, left and went to the operating room on 08/01/2021 and underwent the above named procedures.  ?  ?Surgeries: Procedure(s): ?TOTAL KNEE ARTHROPLASTY on 08/01/2021 ?Patient tolerated the surgery well. Taken to PACU where she was stabilized and then transferred to the orthopedic floor. ? ?Started on Lovenox 30 mg q 12 hrs. Foot pumps applied bilaterally at 80 mm. Heels elevated on bed with rolled towels. No evidence of DVT. Negative Homan. ?Physical therapy started on day #1 for gait training and transfer. OT started day #1 for ADL and assisted devices. ? ?Patient's foley was d/c on day #1.  On postop day 1 patient with acute postop blood loss anemia, he hemoglobin down to 10.1.  She is started on iron supplement.  On postop day 2, hemoglobin stable at 9.5.  Patient's blood pressure soft, losartan held on postop day 1 and 2.  Patient's IV was d/c on day #2.  Patient tolerating p.o. well.  Slow progress of physical therapy.  PT recommending skilled nursing facility.  Patient stable and ready for discharge ? ?Patient did complain of increased urinary frequency/incontinence.  UA was negative for UTI and patient was able to stand and void on the bedside commode.  Bladder scan demonstrated 269m of urine, in and out cath performed and patient was started on Flomax with improvement. ? ?Would recommend external catheter at rehab as a precaution. ? ?On post op day #5 [facility] ? ? ? ?  She was given perioperative antibiotics:  ?Anti-infectives (From admission, onward)  ? ? Start     Dose/Rate Route Frequency Ordered Stop  ? 08/01/21 1630  ceFAZolin (ANCEF) IVPB 1 g/50 mL premix       ? 1 g ?100 mL/hr over 30 Minutes Intravenous Every 6 hours 08/01/21 1405 08/02/21 0632  ? 08/01/21 0828  ceFAZolin (ANCEF) 2-4 GM/100ML-% IVPB       ?Note to Pharmacy: Doreen Salvage J: cabinet override  ?    08/01/21 0828 08/01/21 1024  ?  08/01/21 0600  ceFAZolin (ANCEF) IVPB 2g/100 mL premix       ? 2 g ?200 mL/hr over 30 Minutes Intravenous On call to O.R. 07/31/21 2229 08/01/21 1038  ? ?  ?. ? ?She was given sequential compression devices, early ambulation, and Lovenox, teds for DVT prophylaxis. ? ?She benefited maximally from the hospital stay and there were no complications.   ? ?Recent vital signs:  ?Vitals:  ? 08/06/21 0324 08/06/21 0754  ?BP: (!) 124/55 (!) 140/57  ?Pulse: 81 81  ?Resp: 18 16  ?Temp: 98.1 ?F (36.7 ?C) 98.4 ?F (36.9 ?C)  ?SpO2: 95% 99%  ? ? ?Recent laboratory studies:  ?Lab Results  ?Component Value Date  ? HGB 9.5 (L) 08/03/2021  ? HGB 10.1 (L) 08/02/2021  ? HGB 13.0 08/01/2021  ? ?Lab Results  ?Component Value Date  ? WBC 11.4 (H) 08/03/2021  ? PLT 217 08/03/2021  ? ?No results found for: INR ?Lab Results  ?Component Value Date  ? NA 127 (L) 08/03/2021  ? K 4.3 08/03/2021  ? CL 92 (L) 08/03/2021  ? CO2 31 08/03/2021  ? BUN 22 08/03/2021  ? CREATININE 0.98 08/03/2021  ? GLUCOSE 110 (H) 08/03/2021  ? ? ?Discharge Medications:   ?Allergies as of 08/06/2021   ? ?   Reactions  ? Azithromycin Hives, Rash  ? Prednisone Nausea And Vomiting  ? Amlodipine Swelling  ? ?  ? ?  ?Medication List  ?  ? ?STOP taking these medications   ? ?acetaminophen 500 MG tablet ?Commonly known as: TYLENOL ?  ? ?  ? ?TAKE these medications   ? ?BIOFREEZE EX ?Apply 1 application. topically daily as needed (knee pain). ?  ?Biotin 5 MG Caps ?Take 5 mg by mouth daily. ?  ?carvedilol 12.5 MG tablet ?Commonly known as: COREG ?Take 12.5 mg by mouth in the morning and at bedtime. May take an additional 12.5 mg if blood pressure is 160 or higher ?  ?cyanocobalamin 2000 MCG tablet ?Take 2,000 mcg by mouth daily. ?  ?Dialyvite Vitamin D 5000 125 MCG (5000 UT) capsule ?Generic drug: Cholecalciferol ?Take 5,000 Units by mouth daily. ?  ?diphenhydramine-acetaminophen 25-500 MG Tabs tablet ?Commonly known as: TYLENOL PM ?Take 1 tablet by mouth at bedtime as needed  (sleep). ?  ?docusate sodium 100 MG capsule ?Commonly known as: COLACE ?Take 1 capsule (100 mg total) by mouth 2 (two) times daily. ?  ?enoxaparin 40 MG/0.4ML injection ?Commonly known as: LOVENOX ?Inject 0.4 mLs (40 mg total) into the skin daily for 14 days. ?  ?escitalopram 10 MG tablet ?Commonly known as: LEXAPRO ?Take 10 mg by mouth every morning. ?  ?estradiol 0.5 MG tablet ?Commonly known as: ESTRACE ?Take 0.5 mg by mouth in the morning. ?  ?ezetimibe 10 MG tablet ?Commonly known as: ZETIA ?Take 1 tablet (10 mg total) by mouth daily. ?  ?ferrous [id number]-N86-[id number] C-folic acid capsule ?Commonly known as: TRINSICON / FOLTRIN ?Take 1  capsule by mouth 2 (two) times daily. ?  ?gabapentin 300 MG capsule ?Commonly known as: NEURONTIN ?Take 300 mg by mouth at bedtime. ?  ?HYDROcodone-acetaminophen 5-325 MG tablet ?Commonly known as: NORCO/VICODIN ?Take 1-2 tablets by mouth every 4 (four) hours as needed for moderate pain (pain score 4-6). ?  ?losartan 25 MG tablet ?Commonly known as: COZAAR ?Take 25 mg by mouth every evening. May take an additional 25 mg if blood pressure is 160 or higher ?  ?Magnesium 500 MG Tabs ?Take 500 mg by mouth daily. ?  ?nitroGLYCERIN 0.4 MG SL tablet ?Commonly known as: NITROSTAT ?Place 1 tablet (0.4 mg total) under the tongue every 5 (five) minutes as needed for chest pain. ?  ?omeprazole 20 MG capsule ?Commonly known as: PRILOSEC ?Take 20 mg by mouth in the morning. ?  ?polyethylene glycol 17 g packet ?Commonly known as: MIRALAX / GLYCOLAX ?Take 17 g by mouth daily as needed for mild constipation. ?  ?POTASSIUM PO ?Take 1 tablet by mouth daily. 650 mg per tablet ?  ?tamsulosin 0.4 MG Caps capsule ?Commonly known as: FLOMAX ?Take 1 capsule (0.4 mg total) by mouth daily. ?  ?tiZANidine 2 MG tablet ?Commonly known as: ZANAFLEX ?Take 2 mg by mouth every evening. ?  ?torsemide 10 MG tablet ?Commonly known as: DEMADEX ?Take 10 mg by mouth in the morning. ?  ?traMADol 50 MG tablet ?Commonly  known as: ULTRAM ?Take 1 tablet (50 mg total) by mouth every 6 (six) hours as needed. ?What changed:  ?when to take this ?reasons to take this ?  ?zinc gluconate 50 MG tablet ?Take 50 mg by mouth daily. ?  ? ?  ? ?Diagno

## 2021-08-03 NOTE — Progress Notes (Signed)
PT Cancellation Note ? ?Patient Details ?Name: [name] Woods ?MRN: [id number] ?DOB: April 13, 1941 ? ? ?Cancelled Treatment:     PT attempt. Pt was supine in bed upon but struggling to stay awake." I've been up to bathroom a few times and just am too tired. Can we wait and do it tomorrow." Encouraged OOB but pt remained unwilling. Author will return in the morning and PT will continue to follow per current POC.  ? ?Willette Pa ?08/03/2021, 4:19 PM ?

## 2021-08-04 NOTE — Progress Notes (Signed)
Occupational Therapy Treatment ?Patient Details ?Name: [name] Woods ?MRN: [id number] ?DOB: 14-Oct-1940 ?Today's Date: 08/04/2021 ? ? ?History of present illness 81yo female s/p L TKA 08/01/21. PMHx includes anxiety, GERD, hx Guillian-Barre syndrome (1978), HTN, migraine headaches, hx cervical and lumbar spinal surgeries, and neuropathy. ?  ?OT comments ? Pt seen for brief OT tx, limited by pain. RN in to address at end of session. Pt set up for seated grooming tasks from recliner. Pt endorsing 9/10 knee pain and declines out of chair recliner. Pt further instructed in polar care mgt after endorsing limited recall of previous instruction, also instructed in IS use and pt able to return demo appropriate technique x10 with intermittent VC to improve technique. Pt verbalized understanding and continues to benefit from skilled OT services.   ? ?Recommendations for follow up therapy are one component of a multi-disciplinary discharge planning process, led by the attending physician.  Recommendations may be updated based on patient status, additional functional criteria and insurance authorization. ?   ?Follow Up Recommendations ? Skilled nursing-short term rehab (<3 hours/day)  ?  ?Assistance Recommended at Discharge Frequent or constant Supervision/Assistance  ?Patient can return home with the following ? A little help with walking and/or transfers;A lot of help with bathing/dressing/bathroom;Assistance with cooking/housework;Assist for transportation;Help with stairs or ramp for entrance ?  ?Equipment Recommendations ? BSC/3in1  ?  ?Recommendations for Other Services   ? ?  ?Precautions / Restrictions Precautions ?Precautions: Fall ?Restrictions ?Weight Bearing Restrictions: Yes ?LLE Weight Bearing: Weight bearing as tolerated  ? ? ?  ? ?Mobility Bed Mobility ?  ?  ?  ?  ?  ?  ?  ?General bed mobility comments: NT, up in recliner ?  ? ?Transfers ?  ?  ?  ?  ?  ?  ?  ?  ?  ?General transfer comment: Pt declined 2/2  pain and reports having just been up to [facility] with staff ?  ?  ?Balance Overall balance assessment: Needs assistance ?Sitting-balance support: Feet supported, Bilateral upper extremity supported ?Sitting balance-Leahy Scale: Fair ?  ?  ?  ?  ?  ?  ?  ?  ?  ?  ?  ?  ?  ?  ?  ?  ?   ? ?ADL either performed or assessed with clinical judgement  ? ?ADL Overall ADL's : Needs assistance/impaired ?  ?  ?Grooming: Sitting;Set up;Oral care;Wash/dry face ?  ?  ?  ?  ?  ?  ?  ?  ?  ?  ?  ?  ?  ?  ?  ?  ?  ?  ? ?Extremity/Trunk Assessment   ?  ?  ?  ?  ?  ? ?Vision   ?  ?  ?Perception   ?  ?Praxis   ?  ? ?Cognition Arousal/Alertness: Awake/alert ?Behavior During Therapy: [facility] for tasks assessed/performed ?Overall Cognitive Status: Within Functional Limits for tasks assessed ?  ?  ?  ?  ?  ?  ?  ?  ?  ?  ?  ?  ?  ?  ?  ?  ?  ?  ?  ?   ?Exercises Other Exercises ?Other Exercises: Pt further instructed in polar care mgt after endorsing limited recall of previous instruction, also instructed in IS use and pt able to return demo appropriate technique x10 with intermittent VC to improve technique ? ?  ?Shoulder Instructions   ? ? ?  ?General Comments    ? ? ?  Pertinent Vitals/ Pain       Pain Assessment ?Pain Assessment: 0-10 ?Pain Score: 9  ?Pain Location: L knee ?Pain Descriptors / Indicators: Aching, Sore, Discomfort ?Pain Intervention(s): Limited activity within patient's tolerance, Monitored during session, Repositioned, Patient requesting pain meds-RN notified, Ice applied ? ?Home Living   ?  ?  ?  ?  ?  ?  ?  ?  ?  ?  ?  ?  ?  ?  ?  ?  ?  ?  ? ?  ?Prior Functioning/Environment    ?  ?  ?  ?   ? ?Frequency ? Min 2X/week  ? ? ? ? ?  ?Progress Toward Goals ? ?OT Goals(current goals can now be found in the care plan section) ? Progress towards OT goals: Progressing toward goals ? ?Acute Rehab OT Goals ?Patient Stated Goal: to regain independence ?OT Goal Formulation: With patient ?Time For Goal Achievement: 08/16/21 ?Potential to  Achieve Goals: Good  ?Plan Discharge plan remains appropriate;Frequency remains appropriate   ? ?Co-evaluation ? ? ?   ?  ?  ?  ?  ? ?  ?AM-PAC OT "6 Clicks" Daily Activity     ?Outcome Measure ? ? Help from another person eating meals?: None ?Help from another person taking care of personal grooming?: A Little ?Help from another person toileting, which includes using toliet, bedpan, or urinal?: A Little ?Help from another person bathing (including washing, rinsing, drying)?: A Lot ?Help from another person to put on and taking off regular upper body clothing?: A Little ?Help from another person to put on and taking off regular lower body clothing?: A Lot ?6 Click Score: 17 ? ?  ?End of Session   ? ?OT Visit Diagnosis: Unsteadiness on feet (R26.81);Muscle weakness (generalized) (M62.81);Pain ?Pain - Right/Left: Left ?Pain - part of body: Knee ?  ?Activity Tolerance Patient tolerated treatment well;Patient limited by pain ?  ?Patient Left in chair;with call bell/phone within reach;with chair alarm set;Other (comment) (polar care in place) ?  ?Nurse Communication Patient requests pain meds ?  ? ?   ? ?Time: 8676-1950 ?OT Time Calculation (min): 9 min ? ?Charges: OT General Charges ?$OT Visit: 1 Visit ?OT Treatments ?$Self Care/Home Management : 8-22 mins ? ?Ardeth Perfect., MPH, MS, OTR/L ?ascom [phone] ?08/04/21, 1:40 PM ? ?

## 2021-08-04 NOTE — Progress Notes (Signed)
? ?  Subjective: ?3 Days Post-Op Procedure(s) (LRB): ?TOTAL KNEE ARTHROPLASTY (Left) ?Patient reports pain as mild.   ?Patient is well with  no complaints. ?Denies any CP, SOB, ABD pain.  No dizziness or lightheadedness ?We will continue therapy today.  ?Plan is to go Skilled nursing facility after hospital stay. ? ?Objective: ?Vital signs in last 24 hours: ?Temp:  [97.8 ?F (36.6 ?C)-98.7 ?F (37.1 ?C)] 98 ?F (36.7 ?C) (03/31 0524) ?Pulse Rate:  [67-78] 78 (03/31 0524) ?Resp:  [15-20] 20 (03/31 0524) ?BP: (100-143)/(46-70) 143/70 (03/31 0524) ?SpO2:  [97 %-100 %] 99 % (03/31 0524) ? ?Intake/Output from previous day: ?No intake/output data recorded. ?Intake/Output this shift: ?No intake/output data recorded. ? ?Recent Labs  ?  08/01/21 ?1518 08/02/21 ?7017 08/03/21 ?7939  ?HGB 13.0 10.1* 9.5*  ? ?Recent Labs  ?  08/02/21 ?0415 08/03/21 ?0438  ?WBC 16.1* 11.4*  ?RBC 3.26* 3.12*  ?HCT 30.0* 28.4*  ?PLT 249 217  ? ?Recent Labs  ?  08/02/21 ?0415 08/03/21 ?0438  ?NA 128* 127*  ?K 4.3 4.3  ?CL 94* 92*  ?CO2 28 31  ?BUN 16 22  ?CREATININE 0.82 0.98  ?GLUCOSE 124* 110*  ?CALCIUM 8.7* 8.5*  ? ?No results for input(s): LABPT, INR in the last 72 hours. ? ?EXAM ?General - Patient is Alert, Appropriate, and Oriented ?Extremity - Neurovascular intact ?Sensation intact distally ?Intact pulses distally ?Dorsiflexion/Plantar flexion intact ?No cellulitis present ?Compartment soft ?Dressing - dressing C/D/I and no drainage, Prevena intact without drainage ?Motor Function - intact, moving foot and toes well on exam.  ? ?Past Medical History:  ?Diagnosis Date  ? Anxiety   ? Arthritis   ? Cancer [facility])   ? SKIN CA  ? Chest pain   ? a. 10/2014 St Echo: nl LV fxn, no wma/ischemia.  ? GERD (gastroesophageal reflux disease)   ? Guillain Barr? syndrome ([place])   ? Guillain Barr? syndrome ([place]) 1980  ? Hypertension   ? PONV (postoperative nausea and vomiting)   ? H/O NAUSEA  ? Spinal stenosis   ? ? ?Assessment/Plan:   ?3 Days Post-Op Procedure(s)  (LRB): ?TOTAL KNEE ARTHROPLASTY (Left) ?Principal Problem: ?  S/P TKR (total knee replacement) using cement, left ?Active Problems: ?  S/P TKR (total knee replacement) ? ?Estimated body mass index is 25.28 kg/m? as calculated from the following: ?  Height as of this encounter: 5' 1.5" (1.562 m). ?  Weight as of this encounter: 61.7 kg. ?Advance diet ?Up with therapy ?Work on bowel movement ?Pain controlled this am ?Care management to assist with discharge to skilled nursing facility, [facility] ? ?DVT Prophylaxis - Lovenox, TED hose, and SCDs ?Weight-Bearing as tolerated to left leg ? ? ?T. Rachelle Hora, PA-C ?[place] ?08/04/2021, 7:58 AM ?  ?

## 2021-08-04 NOTE — Progress Notes (Signed)
Physical Therapy Treatment ?Patient Details ?Name: [name] Woods ?MRN: [id number] ?DOB: 1940/10/12 ?Today's Date: 08/04/2021 ? ? ?History of Present Illness 81yo female s/p L TKA 08/01/21. PMHx includes anxiety, GERD, hx Guillian-Barre syndrome (1978), HTN, migraine headaches, hx cervical and lumbar spinal surgeries, and neuropathy. ? ?  ?PT Comments  ? ? Pt was long sitting in bed sitting on bed pan upon arriving. She requested to use BSC to attempt to have BM. She required min-mod assist to exit bed. Stood to [employer] and took a few steps to [facility] prior to standing and ambulating ~ 8 ft to recliner. Unsuccessful with having BM but did pass a lot of gas. Pt will require SNF at DC to address deficits while assisting pt to PLOF. Pt lives alone and is unsafe to return directly home.  ?  ?Recommendations for follow up therapy are one component of a multi-disciplinary discharge planning process, led by the attending physician.  Recommendations may be updated based on patient status, additional functional criteria and insurance authorization. ? ?Follow Up Recommendations ? Skilled nursing-short term rehab (<3 hours/day) ?  ?  ?Assistance Recommended at Discharge Frequent or constant Supervision/Assistance  ?Patient can return home with the following A little help with walking and/or transfers;A little help with bathing/dressing/bathroom;Assistance with cooking/housework;Assist for transportation;Help with stairs or ramp for entrance ?  ?Equipment Recommendations ? None recommended by PT  ?  ?   ?Precautions / Restrictions Precautions ?Precautions: Fall ?Restrictions ?Weight Bearing Restrictions: Yes ?LLE Weight Bearing: Weight bearing as tolerated  ?  ? ?Mobility ? Bed Mobility ?Overal bed mobility: Needs Assistance ?Bed Mobility: Supine to Sit ?  ?  ?Supine to sit: Mod assist, HOB elevated ?  ?  ?General bed mobility comments: increased time to perform ?  ? ?Transfers ?Overall transfer level: Needs assistance ?Equipment  used: Rolling walker (2 wheels) ?Transfers: Sit to/from Stand ?Sit to Stand: Min assist, From elevated surface ?  ?  ?  ?  ?  ?General transfer comment: pt stood EOB and from [facility] with min assist + vcsing ?  ? ?Ambulation/Gait ?Ambulation/Gait assistance: Min guard, Min assist ?Gait Distance (Feet): 8 Feet ?Assistive device: Rolling walker (2 wheels) ?Gait Pattern/deviations: Step-to pattern, Antalgic ?Gait velocity: decreased ?  ?  ?General Gait Details: pt was able to ambulate ~ 8 ft with slow antalgic step to pattern. increased time to perform with min assist occasional for lateral wt shift to allow opposite lE advancement ? ?  ?Balance Overall balance assessment: Needs assistance ?Sitting-balance support: Feet supported, Bilateral upper extremity supported ?Sitting balance-Leahy Scale: Fair ?  ?  ?Standing balance support: Bilateral upper extremity supported, During functional activity, Reliant on assistive device for balance ?Standing balance-Leahy Scale: Fair ?  ?  ?  ?Cognition Arousal/Alertness: Awake/alert ?Behavior During Therapy: [facility] for tasks assessed/performed ?Overall Cognitive Status: Within Functional Limits for tasks assessed ?  ?   ?General Comments: Pt is A and O x 4 ?  ?  ? ?  ?   ?   ? ?Pertinent Vitals/Pain Pain Assessment ?Pain Assessment: 0-10 ?Pain Score: 4  ?Pain Location: L knee ?Pain Descriptors / Indicators: Aching, Sore, Discomfort ?Pain Intervention(s): Limited activity within patient's tolerance, Monitored during session, Premedicated before session, Repositioned, Ice applied  ? ? ? ?PT Goals (current goals can now be found in the care plan section) Acute Rehab PT Goals ?Patient Stated Goal: to get better to go home ?Progress towards PT goals: Progressing toward goals ? ?  ?Frequency ? ? ?  BID ? ? ? ?  ?PT Plan Current plan remains appropriate  ? ? ?   ?AM-PAC PT "6 Clicks" Mobility   ?Outcome Measure ? Help needed turning from your back to your side while in a flat bed without using  bedrails?: A Little ?Help needed moving from lying on your back to sitting on the side of a flat bed without using bedrails?: A Little ?Help needed moving to and from a bed to a chair (including a wheelchair)?: A Little ?Help needed standing up from a chair using your arms (e.g., wheelchair or bedside chair)?: A Little ?Help needed to walk in hospital room?: A Lot ?Help needed climbing 3-5 steps with a railing? : Total ?6 Click Score: 15 ? ?  ?End of Session   ?Activity Tolerance: Patient tolerated treatment well ?Patient left: in chair;with call bell/phone within reach;with chair alarm set;with nursing/sitter in room ?Nurse Communication: Mobility status ?PT Visit Diagnosis: Unsteadiness on feet (R26.81);Pain;Muscle weakness (generalized) (M62.81) ?Pain - Right/Left: Left ?Pain - part of body: Knee ?  ? ? ?Time: 3235-5732 ?PT Time Calculation (min) (ACUTE ONLY): 23 min ? ?Charges:  $Gait Training: 8-22 mins ?$Therapeutic Activity: 8-22 mins          ?          ?Julaine Fusi PTA ?08/04/21, 10:07 AM  ? ?

## 2021-08-04 NOTE — Progress Notes (Signed)
Physical Therapy Treatment ?Patient Details ?Name: [name] Woods ?MRN: [id number] ?DOB: 02/15/1941 ?Today's Date: 08/04/2021 ? ? ?History of Present Illness 81yo female s/p L TKA 08/01/21. PMHx includes anxiety, GERD, hx Guillian-Barre syndrome (1978), HTN, migraine headaches, hx cervical and lumbar spinal surgeries, and neuropathy. ? ?  ?PT Comments  ? ? Pt was long sitting in bed upon arriving. She agrees to session and is cooperative however much more fatigued this afternoon than earlier in the day. She was able to progress to EOB and stand 2 x prior to perform ROM and there ex. AROM to ~ 84 degrees after performing AAROM in sitting. She tolerated session well and continue to progress. Will required SNF at DC due to lack of assistance at home and need for assistance to perform I ADLs. Acute PT will continue to follow and progress as able per current POC.   ?Recommendations for follow up therapy are one component of a multi-disciplinary discharge planning process, led by the attending physician.  Recommendations may be updated based on patient status, additional functional criteria and insurance authorization. ? ?Follow Up Recommendations ? Skilled nursing-short term rehab (<3 hours/day) ?  ?  ?Assistance Recommended at Discharge Frequent or constant Supervision/Assistance  ?Patient can return home with the following A little help with walking and/or transfers;A little help with bathing/dressing/bathroom;Assistance with cooking/housework;Assist for transportation;Help with stairs or ramp for entrance ?  ?Equipment Recommendations ? None recommended by PT  ?  ?   ?Precautions / Restrictions Precautions ?Precautions: Fall ?Restrictions ?Weight Bearing Restrictions: Yes ?LLE Weight Bearing: Weight bearing as tolerated  ?  ? ?Mobility ? Bed Mobility ?Overal bed mobility: Needs Assistance ?Bed Mobility: Supine to Sit ?  ?  ?Supine to sit: Min assist, HOB elevated ?Sit to supine: Min assist ?  ?General bed  mobility comments: increased time to perform ?  ? ?Transfers ?Overall transfer level: Needs assistance ?Equipment used: Rolling walker (2 wheels) ?Transfers: Sit to/from Stand ?Sit to Stand: Min assist, From elevated surface ?  ?  ?  ?  ?  ?General transfer comment: pt stood 2 x EOB. she did not want to ambulate due to fatigue. ?  ? ?Ambulation/Gait ?Ambulation/Gait assistance: Min guard, Min assist ?Gait Distance (Feet): 8 Feet ?Assistive device: Rolling walker (2 wheels) ?Gait Pattern/deviations: Step-to pattern, Antalgic ?Gait velocity: decreased ?  ?  ?General Gait Details: unwilling this session due to fatigue ?  ?Balance Overall balance assessment: Needs assistance ?Sitting-balance support: Feet supported, Bilateral upper extremity supported ?Sitting balance-Leahy Scale: Fair ?  ?  ?Standing balance support: Bilateral upper extremity supported, During functional activity, Reliant on assistive device for balance ?Standing balance-Leahy Scale: Fair ?  ?  ?Cognition Arousal/Alertness: Awake/alert ?Behavior During Therapy: [facility] for tasks assessed/performed ?Overall Cognitive Status: Within Functional Limits for tasks assessed ?   ?General Comments: Pt is A and O x 4 ?  ?  ? ?  ?Exercises Total Joint Exercises ?Ankle Circles/Pumps: AROM, 10 reps ?Quad Sets: AROM, 10 reps ?Gluteal Sets: AROM, 10 reps ?Heel Slides: AROM, 10 reps ?Hip ABduction/ADduction: AROM, 10 reps ?Straight Leg Raises: AROM, 10 reps ?Long Arc Quad: AROM, 10 reps ?Goniometric ROM: ~ 84 degrees ? ?  ?   ? ?Pertinent Vitals/Pain Pain Assessment ?Pain Assessment: 0-10 ?Pain Score: 3  ?Faces Pain Scale: Hurts a little bit ?Pain Location: L knee ?Pain Descriptors / Indicators: Aching, Sore, Discomfort ?Pain Intervention(s): Limited activity within patient's tolerance, Monitored during session, Premedicated before session, Repositioned  ? ? ? ?PT Goals (  current goals can now be found in the care plan section) Acute Rehab PT Goals ?Patient Stated Goal: to  get better to go home ?Progress towards PT goals: Progressing toward goals ? ?  ?Frequency ? ? ? BID ? ? ? ?  ?PT Plan Current plan remains appropriate  ? ? ?   ?AM-PAC PT "6 Clicks" Mobility   ?Outcome Measure ? Help needed turning from your back to your side while in a flat bed without using bedrails?: A Little ?Help needed moving from lying on your back to sitting on the side of a flat bed without using bedrails?: A Little ?Help needed moving to and from a bed to a chair (including a wheelchair)?: A Little ?Help needed standing up from a chair using your arms (e.g., wheelchair or bedside chair)?: A Little ?Help needed to walk in hospital room?: A Lot ?Help needed climbing 3-5 steps with a railing? : A Lot ?6 Click Score: 16 ? ?  ?End of Session   ?Activity Tolerance: Patient tolerated treatment well ?Patient left: in bed;with call bell/phone within reach;with bed alarm set ?Nurse Communication: Mobility status ?PT Visit Diagnosis: Unsteadiness on feet (R26.81);Pain;Muscle weakness (generalized) (M62.81) ?Pain - Right/Left: Left ?Pain - part of body: Knee ?  ? ? ?Time: 6433-2951 ?PT Time Calculation (min) (ACUTE ONLY): 29 min ? ?Charges:  $Gait Training: 8-22 mins ?$Therapeutic Exercise: 8-22 mins ?$Therapeutic Activity: 8-22 mins          ?          ? ?Julaine Fusi PTA ?08/04/21, 1:43 PM  ? ?

## 2021-08-04 NOTE — TOC Progression Note (Addendum)
Transition of Care (TOC) - Progression Note  ? ? ?Patient Details  ?Name: [name] Woods ?MRN: [id number] ?Date of Birth: [birth year] ? ?Transition of Care (TOC) CM/SW Contact  ?Candie Chroman, LCSW ?Phone Number: ?08/04/2021, 11:52 AM ? ?Clinical Narrative:  Started Ship broker for [employer] and EMS. SNF is able to accept her over the weekend if needed.  ? ?3:21 pm: Auth approved for SNF [id number]) and EMS ([id number]). SNF will have a bed for her tomorrow. ? ?Expected Discharge Plan and Services ?  ?  ?  ?  ?  ?                ?  ?  ?  ?  ?  ?  ?  ?  ?  ?  ? ? ?Social Determinants of Health (SDOH) Interventions ?  ? ?Readmission Risk Interventions ?   ? View : No data to display.  ?  ?  ?  ? ? ?

## 2021-08-04 NOTE — Care Management Important Message (Signed)
Important Message ? ?Patient Details  ?Name: [name] Woods ?MRN: [id number] ?Date of Birth: 04/27/1941 ? ? ?Medicare Important Message Given:  N/A - LOS <3 / Initial given by admissions ? ? ? ? ?Juliann Pulse A Jaevin Medearis ?08/04/2021, 11:19 AM ?

## 2021-08-05 LAB — URINALYSIS, COMPLETE (UACMP) WITH MICROSCOPIC
Bacteria, UA: NONE SEEN
Bilirubin Urine: NEGATIVE
Glucose, UA: NEGATIVE mg/dL
Hgb urine dipstick: NEGATIVE
Ketones, ur: 20 mg/dL — AB
Leukocytes,Ua: NEGATIVE
Nitrite: NEGATIVE
Protein, ur: NEGATIVE mg/dL
Specific Gravity, Urine: 1.006 (ref 1.005–1.030)
pH: 6 (ref 5.0–8.0)

## 2021-08-05 MED ORDER — TAMSULOSIN HCL 0.4 MG PO CAPS
0.4000 mg | ORAL_CAPSULE | Freq: Every day | ORAL | Status: DC
  Administered 2021-08-05 – 2021-08-06 (×2): 0.4 mg via ORAL
  Filled 2021-08-05 (×2): qty 1

## 2021-08-05 NOTE — TOC Progression Note (Signed)
Transition of Care (TOC) - Progression Note  ? ? ?Patient Details  ?Name: [name] Woods ?MRN: [id number] ?Date of Birth: [birth year] ? ?Transition of Care (TOC) CM/SW Contact  ?Raina Mina, LCSWA ?Phone Number: ?08/05/2021, 2:18 PM ? ?Clinical Narrative:    Patient was to discharge to room [facility] at [facility] today but due to bladder scan results patient will not be discharging today. CSW spoke with Seth Bake at [facility] who stated if patient is ready she can discharge tomorrow.  ?   ? ? ?  ?  ? ?Expected Discharge Plan and Services ?  ?  ?  ?  ?  ?Expected Discharge Date: 08/05/21               ?  ?  ?  ?  ?  ?  ?  ?  ?  ?  ? ? ?Social Determinants of Health (SDOH) Interventions ?  ? ?Readmission Risk Interventions ?   ? View : No data to display.  ?  ?  ?  ? ? ?

## 2021-08-05 NOTE — Progress Notes (Signed)
Vitals entered manually ° °

## 2021-08-05 NOTE — Progress Notes (Signed)
Physical Therapy Treatment ?Patient Details ?Name: [name] Woods ?MRN: [id number] ?DOB: 12-12-1940 ?Today's Date: 08/05/2021 ? ? ?History of Present Illness 81yo female s/p L TKA 08/01/21. PMHx includes anxiety, GERD, hx Guillian-Barre syndrome (1978), HTN, migraine headaches, hx cervical and lumbar spinal surgeries, and neuropathy. ? ?  ?PT Comments  ? ? Pt was long sitting in bed upon arriving. She agrees to PT session and is cooperative throughout. She endorses having rough night and is dealing with some incontinence issues. She did not want to get OOB but with encouragement was willing to get OOB to [facility]. She did successfully urinate on BSC prior to returning to bed. Pt continues to required increased time + assistance for all mobility, transfers, and gait. Overall pt is progressing but slowly. She will require extensive PT to return to PLOF. Recommend DC to SNF.  ?  ?Recommendations for follow up therapy are one component of a multi-disciplinary discharge planning process, led by the attending physician.  Recommendations may be updated based on patient status, additional functional criteria and insurance authorization. ? ?Follow Up Recommendations ? Skilled nursing-short term rehab (<3 hours/day) ?  ?  ?Assistance Recommended at Discharge Frequent or constant Supervision/Assistance  ?Patient can return home with the following A little help with walking and/or transfers;A little help with bathing/dressing/bathroom;Assistance with cooking/housework;Assist for transportation;Help with stairs or ramp for entrance ?  ?Equipment Recommendations ? None recommended by PT  ?  ?   ?Precautions / Restrictions Precautions ?Precautions: Fall ?Restrictions ?Weight Bearing Restrictions: Yes ?LLE Weight Bearing: Weight bearing as tolerated  ?  ? ?Mobility ? Bed Mobility ?Overal bed mobility: Needs Assistance ?Bed Mobility: Supine to Sit ?  ?  ?Supine to sit: Min assist, HOB elevated ?Sit to supine: Min assist ?  ?  ?   ? ?Transfers ?Overall transfer level: Needs assistance ?Equipment used: Rolling walker (2 wheels) ?Transfers: Sit to/from Stand ?Sit to Stand: Min assist, From elevated surface ?  ?Step pivot transfers: Min guard, Min assist ?  ?  ?  ?General transfer comment: increased time required with min assist to stand and take a few steps to pivot to [facility]. pt has very slow antalgic step to pattern. Vcs throughout for step by step sequencing. ?  ? ?Ambulation/Gait ?Ambulation/Gait assistance: Min guard, Min assist ?Gait Distance (Feet): 4 Feet ?Assistive device: Rolling walker (2 wheels) ?Gait Pattern/deviations: Step-to pattern, Antalgic ?Gait velocity: decreased ?  ?  ?General Gait Details: pt takes a very long time to take steps to [facility] from EOB then to return to bed. Vcs throughout for sequencing. did better with ambulation back to bed from [facility] then going to [facility]. ? ?  ?Balance Overall balance assessment: Needs assistance ?Sitting-balance support: Feet supported, Bilateral upper extremity supported ?Sitting balance-Leahy Scale: Good ?Sitting balance - Comments: no balance concerns i sitting ?  ?Standing balance support: Bilateral upper extremity supported, During functional activity, Reliant on assistive device for balance ?Standing balance-Leahy Scale: Poor ?Standing balance comment: Did have one episode of LOB with intervention ?  ?  ?  ?Cognition Arousal/Alertness: Awake/alert ?Behavior During Therapy: [facility] for tasks assessed/performed ?Overall Cognitive Status: Within Functional Limits for tasks assessed ?  ?   ?General Comments: Pt is A and O x 4 ?  ?  ? ?  ?   ?General Comments General comments (skin integrity, edema, etc.): lengthy discussion about need to DC to rehab. pt does not want to DC today however Pryor Curia does feel she is able to if cleared by  MD. ?  ?  ? ?Pertinent Vitals/Pain Pain Assessment ?Pain Assessment: 0-10 ?Pain Score: 4  ?Faces Pain Scale: Hurts a little bit ?Pain Location: L knee ?Pain Descriptors /  Indicators: Aching, Sore, Discomfort ?Pain Intervention(s): Limited activity within patient's tolerance, Monitored during session, Premedicated before session, Repositioned, Ice applied  ? ? ? ?PT Goals (current goals can now be found in the care plan section) Acute Rehab PT Goals ?Patient Stated Goal: get better and return to [facility] ?Progress towards PT goals: Progressing toward goals (slow progress due to pain) ? ?  ?Frequency ? ? ? BID ? ? ? ?  ?PT Plan Current plan remains appropriate  ? ? ?   ?AM-PAC PT "6 Clicks" Mobility   ?Outcome Measure ? Help needed turning from your back to your side while in a flat bed without using bedrails?: A Little ?Help needed moving from lying on your back to sitting on the side of a flat bed without using bedrails?: A Little ?Help needed moving to and from a bed to a chair (including a wheelchair)?: A Little ?Help needed standing up from a chair using your arms (e.g., wheelchair or bedside chair)?: A Lot ?Help needed to walk in hospital room?: A Lot ?Help needed climbing 3-5 steps with a railing? : A Lot ?6 Click Score: 15 ? ?  ?End of Session Equipment Utilized During Treatment: Gait belt ?Activity Tolerance: Patient tolerated treatment well ?Patient left: in bed;with call bell/phone within reach;with bed alarm set ?Nurse Communication: Mobility status ?PT Visit Diagnosis: Unsteadiness on feet (R26.81);Pain;Muscle weakness (generalized) (M62.81) ?Pain - Right/Left: Left ?Pain - part of body: Knee ?  ? ? ?Time: 9983-3825 ?PT Time Calculation (min) (ACUTE ONLY): 26 min ? ?Charges:  $Therapeutic Activity: 23-37 mins          ?          ? ?Julaine Fusi PTA ?08/05/21, 12:05 PM  ? ?

## 2021-08-05 NOTE — Progress Notes (Addendum)
Subjective: ?4 Days Post-Op Procedure(s) (LRB): ?TOTAL KNEE ARTHROPLASTY (Left) ?Patient reports pain as mild this morning. ?Patient is well but does report urinary incontinence this morning which she states is new. ?She denies any saddle paresthesias, she states that he has the urge to urinate but is not able to hold it in. ?Denies any CP, SOB, ABD pain.  No dizziness or lightheadedness ?We will continue therapy today.  ?Plan is to go Skilled nursing facility after hospital stay. ?Patient has had a BM.  Current plan is for discharge to [facility]. ? ?Objective: ?Vital signs in last 24 hours: ?Temp:  [97.8 ?F (36.6 ?C)-98.6 ?F (37 ?C)] 97.8 ?F (36.6 ?C) (04/01 0444) ?Pulse Rate:  [72-79] 79 (04/01 0444) ?Resp:  [17-20] 20 (04/01 0444) ?BP: (108-155)/(51-68) 155/68 (04/01 0444) ?SpO2:  [98 %-99 %] 99 % (04/01 0444) ? ?Intake/Output from previous day: ?03/31 0701 - 04/01 0700 ?In: 480 [P.O.:480] ?Out: 0  ?Intake/Output this shift: ?No intake/output data recorded. ? ?Recent Labs  ?  08/03/21 ?0438  ?HGB 9.5*  ? ?Recent Labs  ?  08/03/21 ?0438  ?WBC 11.4*  ?RBC 3.12*  ?HCT 28.4*  ?PLT 217  ? ?Recent Labs  ?  08/03/21 ?0438  ?NA 127*  ?K 4.3  ?CL 92*  ?CO2 31  ?BUN 22  ?CREATININE 0.98  ?GLUCOSE 110*  ?CALCIUM 8.5*  ? ?No results for input(s): LABPT, INR in the last 72 hours. ? ?EXAM ?General - Patient is Alert, Appropriate, and Oriented ?Extremity - Neurovascular intact ?Sensation intact distally ?Intact pulses distally ?Dorsiflexion/Plantar flexion intact ?No cellulitis present ?Compartment soft ?Dressing - dressing C/D/I and no drainage, Prevena intact without drainage ?Motor Function - intact, moving foot and toes well on exam.  ? ?Past Medical History:  ?Diagnosis Date  ? Anxiety   ? Arthritis   ? Cancer [facility])   ? SKIN CA  ? Chest pain   ? a. 10/2014 St Echo: nl LV fxn, no wma/ischemia.  ? GERD (gastroesophageal reflux disease)   ? Guillain Barr? syndrome ([place])   ? Guillain Barr? syndrome ([place]) 1980  ? Hypertension    ? PONV (postoperative nausea and vomiting)   ? H/O NAUSEA  ? Spinal stenosis   ? ? ?Assessment/Plan:   ?4 Days Post-Op Procedure(s) (LRB): ?TOTAL KNEE ARTHROPLASTY (Left) ?Principal Problem: ?  S/P TKR (total knee replacement) using cement, left ?Active Problems: ?  S/P TKR (total knee replacement) ? ?Estimated body mass index is 25.28 kg/m? as calculated from the following: ?  Height as of this encounter: 5' 1.5" (1.562 m). ?  Weight as of this encounter: 61.7 kg. ?Advance diet ?Up with therapy ? ?Vitals reviewed this morning, no recent fevers. ?Patient has had a BM. ?Pain is well controlled this morning. ?Patient reporting urinary incontinence, she denies any numbness, still has the urge to urinate. ?Will place order for external catheter for the patient, In and Out order placed as well.  Will also check for signs of UTI. ?UP with therapy this morning.   ?Plan for discharge to [facility], possibly today. ? ?Addendum: ?UA negative for UTI.  Bladder scan demonstrated 265 ml.  In and out Cath ordered. ?Patient not adequately voiding.  Will start on Flomax.  Will hold on discharge until tomorrow. ? ?DVT Prophylaxis - Lovenox, TED hose, and SCDs ?Weight-Bearing as tolerated to left leg ? ?Raquel Matylda Fehring, PA-C ?[place] ?08/05/2021, 7:58 AM ?  ?

## 2021-08-05 NOTE — Progress Notes (Signed)
PT Cancellation Note ? ?Patient Details ?Name: [name] Woods ?MRN: [id number] ?DOB: 06-14-1940 ? ? ?Cancelled Treatment:     PT attempt 2 x this afternoon. Pt politely refused requested PT to return tomorrow. Plan is to DC to [facility] once medically cleared.  ? ? ?Willette Pa ?08/05/2021, 3:16 PM ?

## 2021-08-05 NOTE — Plan of Care (Signed)
?  Problem: Education: ?Goal: Knowledge of General Education information will improve ?Description: Including pain rating scale, medication(s)/side effects and non-pharmacologic comfort measures ?Outcome: Progressing ?  ?Problem: Health Behavior/Discharge Planning: ?Goal: Ability to manage health-related needs will improve ?Outcome: Progressing ?  ?Problem: Clinical Measurements: ?Goal: Ability to maintain clinical measurements within normal limits will improve ?Outcome: Progressing ?Goal: Will remain free from infection ?Outcome: Progressing ?Goal: Respiratory complications will improve ?Outcome: Not Applicable ?Goal: Cardiovascular complication will be avoided ?Outcome: Progressing ?  ?Problem: Activity: ?Goal: Risk for activity intolerance will decrease ?Outcome: Progressing ?  ?Problem: Nutrition: ?Goal: Adequate nutrition will be maintained ?Outcome: Progressing ?  ?Problem: Coping: ?Goal: Level of anxiety will decrease ?Outcome: Progressing ?  ?Problem: Elimination: ?Goal: Will not experience complications related to bowel motility ?Outcome: Progressing ?Goal: Will not experience complications related to urinary retention ?Outcome: Progressing ?  ?Problem: Pain Managment: ?Goal: General experience of comfort will improve ?Outcome: Progressing ?  ?Problem: Safety: ?Goal: Ability to remain free from injury will improve ?Outcome: Progressing ?  ?Problem: Skin Integrity: ?Goal: Risk for impaired skin integrity will decrease ?Outcome: Progressing ?  ?Problem: Activity: ?Goal: Ability to avoid complications of mobility impairment will improve ?Outcome: Progressing ?Goal: Range of joint motion will improve ?Outcome: Progressing ?  ?Problem: Clinical Measurements: ?Goal: Postoperative complications will be avoided or minimized ?Outcome: Progressing ?  ?Problem: Pain Management: ?Goal: Pain level will decrease with appropriate interventions ?Outcome: Progressing ?  ?

## 2021-08-06 DIAGNOSIS — F339 Major depressive disorder, recurrent, unspecified: Secondary | ICD-10-CM | POA: Diagnosis not present

## 2021-08-06 DIAGNOSIS — F39 Unspecified mood [affective] disorder: Secondary | ICD-10-CM | POA: Diagnosis not present

## 2021-08-06 DIAGNOSIS — R2689 Other abnormalities of gait and mobility: Secondary | ICD-10-CM | POA: Diagnosis not present

## 2021-08-06 DIAGNOSIS — Z79890 Hormone replacement therapy (postmenopausal): Secondary | ICD-10-CM | POA: Diagnosis not present

## 2021-08-06 DIAGNOSIS — Z96652 Presence of left artificial knee joint: Secondary | ICD-10-CM | POA: Diagnosis not present

## 2021-08-06 DIAGNOSIS — N959 Unspecified menopausal and perimenopausal disorder: Secondary | ICD-10-CM | POA: Diagnosis not present

## 2021-08-06 DIAGNOSIS — M6281 Muscle weakness (generalized): Secondary | ICD-10-CM | POA: Diagnosis not present

## 2021-08-06 DIAGNOSIS — R531 Weakness: Secondary | ICD-10-CM | POA: Diagnosis not present

## 2021-08-06 DIAGNOSIS — F419 Anxiety disorder, unspecified: Secondary | ICD-10-CM | POA: Diagnosis not present

## 2021-08-06 DIAGNOSIS — Z7401 Bed confinement status: Secondary | ICD-10-CM | POA: Diagnosis not present

## 2021-08-06 DIAGNOSIS — M1712 Unilateral primary osteoarthritis, left knee: Secondary | ICD-10-CM | POA: Diagnosis not present

## 2021-08-06 DIAGNOSIS — F411 Generalized anxiety disorder: Secondary | ICD-10-CM | POA: Diagnosis not present

## 2021-08-06 DIAGNOSIS — R278 Other lack of coordination: Secondary | ICD-10-CM | POA: Diagnosis not present

## 2021-08-06 DIAGNOSIS — R609 Edema, unspecified: Secondary | ICD-10-CM | POA: Diagnosis not present

## 2021-08-06 DIAGNOSIS — R2242 Localized swelling, mass and lump, left lower limb: Secondary | ICD-10-CM | POA: Diagnosis not present

## 2021-08-06 DIAGNOSIS — Z471 Aftercare following joint replacement surgery: Secondary | ICD-10-CM | POA: Diagnosis not present

## 2021-08-06 DIAGNOSIS — K219 Gastro-esophageal reflux disease without esophagitis: Secondary | ICD-10-CM | POA: Diagnosis not present

## 2021-08-06 DIAGNOSIS — M48062 Spinal stenosis, lumbar region with neurogenic claudication: Secondary | ICD-10-CM | POA: Diagnosis not present

## 2021-08-06 DIAGNOSIS — G610 Guillain-Barre syndrome: Secondary | ICD-10-CM | POA: Diagnosis not present

## 2021-08-06 DIAGNOSIS — G4709 Other insomnia: Secondary | ICD-10-CM | POA: Diagnosis not present

## 2021-08-06 DIAGNOSIS — I2510 Atherosclerotic heart disease of native coronary artery without angina pectoris: Secondary | ICD-10-CM | POA: Diagnosis not present

## 2021-08-06 DIAGNOSIS — M8580 Other specified disorders of bone density and structure, unspecified site: Secondary | ICD-10-CM | POA: Diagnosis not present

## 2021-08-06 DIAGNOSIS — I10 Essential (primary) hypertension: Secondary | ICD-10-CM | POA: Diagnosis not present

## 2021-08-06 DIAGNOSIS — Z741 Need for assistance with personal care: Secondary | ICD-10-CM | POA: Diagnosis not present

## 2021-08-06 DIAGNOSIS — K5900 Constipation, unspecified: Secondary | ICD-10-CM | POA: Diagnosis not present

## 2021-08-06 DIAGNOSIS — E871 Hypo-osmolality and hyponatremia: Secondary | ICD-10-CM | POA: Diagnosis not present

## 2021-08-06 DIAGNOSIS — M4802 Spinal stenosis, cervical region: Secondary | ICD-10-CM | POA: Diagnosis not present

## 2021-08-06 DIAGNOSIS — Z4789 Encounter for other orthopedic aftercare: Secondary | ICD-10-CM | POA: Diagnosis not present

## 2021-08-06 MED ORDER — TAMSULOSIN HCL 0.4 MG PO CAPS
0.4000 mg | ORAL_CAPSULE | Freq: Every day | ORAL | 0 refills | Status: DC
Start: 2021-08-06 — End: 2021-11-23

## 2021-08-06 NOTE — Plan of Care (Signed)
?  Problem: Education: ?Goal: Knowledge of General Education information will improve ?Description: Including pain rating scale, medication(s)/side effects and non-pharmacologic comfort measures ?Outcome: Progressing ?  ?Problem: Health Behavior/Discharge Planning: ?Goal: Ability to manage health-related needs will improve ?Outcome: Progressing ?  ?Problem: Clinical Measurements: ?Goal: Ability to maintain clinical measurements within normal limits will improve ?Outcome: Progressing ?Goal: Will remain free from infection ?Outcome: Progressing ?Goal: Diagnostic test results will improve ?Outcome: Progressing ?Goal: Cardiovascular complication will be avoided ?Outcome: Progressing ?  ?Problem: Activity: ?Goal: Risk for activity intolerance will decrease ?Outcome: Progressing ?  ?Problem: Nutrition: ?Goal: Adequate nutrition will be maintained ?Outcome: Progressing ?  ?Problem: Coping: ?Goal: Level of anxiety will decrease ?Outcome: Progressing ?  ?Problem: Elimination: ?Goal: Will not experience complications related to bowel motility ?Outcome: Progressing ?Goal: Will not experience complications related to urinary retention ?Outcome: Progressing ?  ?Problem: Pain Managment: ?Goal: General experience of comfort will improve ?Outcome: Progressing ?  ?Problem: Safety: ?Goal: Ability to remain free from injury will improve ?Outcome: Progressing ?  ?Problem: Skin Integrity: ?Goal: Risk for impaired skin integrity will decrease ?Outcome: Progressing ?  ?Problem: Activity: ?Goal: Ability to avoid complications of mobility impairment will improve ?Outcome: Progressing ?Goal: Range of joint motion will improve ?Outcome: Progressing ?  ?Problem: Clinical Measurements: ?Goal: Postoperative complications will be avoided or minimized ?Outcome: Progressing ?  ?Problem: Pain Management: ?Goal: Pain level will decrease with appropriate interventions ?Outcome: Progressing ?  ?

## 2021-08-06 NOTE — Progress Notes (Addendum)
Subjective: ?5 Days Post-Op Procedure(s) (LRB): ?TOTAL KNEE ARTHROPLASTY (Left) ?Patient reports pain as mild this morning. ?Patient is well, Bladder scan yesterday demonstrated incomplete bladder emptying, started on Flomax.  Still with urge incontinence but is urinating more when she does urinate. ?She denies any saddle paresthesias or low back pain.  UA negative for UTI.  Denies any Numbness or tingling in her legs. ?Denies any CP, SOB, ABD pain.  No dizziness or lightheadedness ?We will continue therapy today.  ?Plan is to go Skilled nursing facility after hospital stay. ?Patient has had a BM.  Current plan is for discharge to [facility]. ? ?Objective: ?Vital signs in last 24 hours: ?Temp:  [97.8 ?F (36.6 ?C)-98.8 ?F (37.1 ?C)] 98.4 ?F (36.9 ?C) (04/02 0754) ?Pulse Rate:  [74-81] 81 (04/02 0754) ?Resp:  [16-18] 16 (04/02 0754) ?BP: (87-140)/(39-57) 140/57 (04/02 0754) ?SpO2:  [95 %-99 %] 99 % (04/02 0754) ? ?Intake/Output from previous day: ?04/01 0701 - 04/02 0700 ?In: 240 [P.O.:240] ?Out: -  ?Intake/Output this shift: ?No intake/output data recorded. ? ?No results for input(s): HGB in the last 72 hours. ? ?No results for input(s): WBC, RBC, HCT, PLT in the last 72 hours. ? ?No results for input(s): NA, K, CL, CO2, BUN, CREATININE, GLUCOSE, CALCIUM in the last 72 hours. ? ?No results for input(s): LABPT, INR in the last 72 hours. ? ?EXAM ?General - Patient is Alert, Appropriate, and Oriented ?Extremity - Neurovascular intact ?Sensation intact distally ?Intact pulses distally ?Dorsiflexion/Plantar flexion intact ?No cellulitis present ?Compartment soft ?Dressing - dressing C/D/I and no drainage, Prevena intact without drainage ?Motor Function - intact, moving foot and toes well on exam.  ? ?Past Medical History:  ?Diagnosis Date  ? Anxiety   ? Arthritis   ? Cancer [facility])   ? SKIN CA  ? Chest pain   ? a. 10/2014 St Echo: nl LV fxn, no wma/ischemia.  ? GERD (gastroesophageal reflux disease)   ? Guillain Barr?  syndrome ([place])   ? Guillain Barr? syndrome ([place]) 1980  ? Hypertension   ? PONV (postoperative nausea and vomiting)   ? H/O NAUSEA  ? Spinal stenosis   ? ? ?Assessment/Plan:   ?5 Days Post-Op Procedure(s) (LRB): ?TOTAL KNEE ARTHROPLASTY (Left) ?Principal Problem: ?  S/P TKR (total knee replacement) using cement, left ?Active Problems: ?  S/P TKR (total knee replacement) ? ?Estimated body mass index is 25.28 kg/m? as calculated from the following: ?  Height as of this encounter: 5' 1.5" (1.562 m). ?  Weight as of this encounter: 61.7 kg. ?Advance diet ?Up with therapy ? ?Vitals reviewed this morning, no recent fevers. ?Patient has had a BM. ?Pain is well controlled this morning. ?Patient is urinating more, still with some urge incontinence but improving with Flomax.  When she is urinating she is urinating more than she did yesterday.  Will plan on continuing this medication when discharged.  ?UP with therapy this morning.   ?Plan for discharge to [facility] today. ? ?DVT Prophylaxis - Lovenox, TED hose, and SCDs ?Weight-Bearing as tolerated to left leg ? ?[name] Mickie Kozikowski, PA-C ?[name] Woods ?08/06/2021, 9:25 AM ?  ?

## 2021-08-06 NOTE — TOC Transition Note (Signed)
Transition of Care (TOC) - CM/SW Discharge Note ? ? ?Patient Details  ?Name: [name] Woods ?MRN: [id number] ?Date of Birth: 26-Sep-1940 ? ?Transition of Care (TOC) CM/SW Contact:  ?Adelene Amas, LCSW ?Phone Number: [phone] ?08/06/2021, 2:06 PM ? ? ?Clinical Narrative:    ? ?Patient will discharge to [facility], room 115, report [phone].  [facility] has been contacted for transport, Unit LPN updated, and confirmed paperwork has been received. ? ?  ?  ? ? ?Patient Goals and CMS Choice ?  ?  ?  ? ?Discharge Placement ?  ?           ?  ?  ?  ?  ? ?Discharge Plan and Services ?  ?  ?           ?  ?  ?  ?  ?  ?  ?  ?  ?  ?  ? ?Social Determinants of Health (SDOH) Interventions ?  ? ? ?Readmission Risk Interventions ?   ? View : No data to display.  ?  ?  ?  ? ? ? ? ? ?

## 2021-08-06 NOTE — Progress Notes (Signed)
Physical Therapy Treatment ?Patient Details ?Name: [name] Woods ?MRN: [id number] ?DOB: [birth year] ?Today's Date: 08/06/2021 ? ? ?History of Present Illness 81yo female s/p L TKA 08/01/21. PMHx includes anxiety, GERD, hx Guillian-Barre syndrome (1978), HTN, migraine headaches, hx cervical and lumbar spinal surgeries, and neuropathy. ? ?  ?PT Comments  ? ? Pt awaiting discharge to SNF today.  Participated in exercises as described below.  Declined OOB at this time while waiting.  Pt encouraged to get OOB later with nursing staff if transfer to [facility] does not take place today as anticipated. ?  ?Recommendations for follow up therapy are one component of a multi-disciplinary discharge planning process, led by the attending physician.  Recommendations may be updated based on patient status, additional functional criteria and insurance authorization. ? ?Follow Up Recommendations ? Skilled nursing-short term rehab (<3 hours/day) ?  ?  ?Assistance Recommended at Discharge Frequent or constant Supervision/Assistance  ?Patient can return home with the following A little help with walking and/or transfers;A little help with bathing/dressing/bathroom;Assistance with cooking/housework;Assist for transportation;Help with stairs or ramp for entrance ?  ?Equipment Recommendations ? None recommended by PT  ?  ?Recommendations for Other Services   ? ? ?  ?Precautions / Restrictions Precautions ?Precautions: Fall ?Restrictions ?Weight Bearing Restrictions: Yes ?LLE Weight Bearing: Weight bearing as tolerated  ?  ? ?Mobility ? Bed Mobility ?  ?  ?  ?  ?  ?  ?  ?General bed mobility comments: declined - awating transport to SNF ?  ? ?Transfers ?  ?  ?  ?  ?  ?  ?  ?  ?  ?  ?  ? ?Ambulation/Gait ?  ?  ?  ?  ?  ?  ?  ?  ? ? ?Stairs ?  ?  ?  ?  ?  ? ? ?Wheelchair Mobility ?  ? ?Modified Rankin (Stroke Patients Only) ?  ? ? ?  ?Balance   ?  ?  ?  ?  ?  ?  ?  ?  ?  ?  ?  ?  ?  ?  ?  ?  ?  ?  ?  ? ?  ?Cognition Arousal/Alertness:  Awake/alert ?Behavior During Therapy: [facility] for tasks assessed/performed ?Overall Cognitive Status: Within Functional Limits for tasks assessed ?  ?  ?  ?  ?  ?  ?  ?  ?  ?  ?  ?  ?  ?  ?  ?  ?  ?  ?  ? ?  ?Exercises Other Exercises ?Other Exercises: supine AAROM LLE and gentle stretch ? ?  ?General Comments   ?  ?  ? ?Pertinent Vitals/Pain Pain Assessment ?Pain Assessment: Faces ?Faces Pain Scale: Hurts even more ?Pain Descriptors / Indicators: Aching, Sore, Discomfort ?Pain Intervention(s): Premedicated before session, Repositioned, Ice applied  ? ? ?Home Living   ?  ?  ?  ?  ?  ?  ?  ?  ?  ?   ?  ?Prior Function    ?  ?  ?   ? ?PT Goals (current goals can now be found in the care plan section) Progress towards PT goals: Progressing toward goals ? ?  ?Frequency ? ? ? BID ? ? ? ?  ?PT Plan Current plan remains appropriate  ? ? ?Co-evaluation   ?  ?  ?  ?  ? ?  ?AM-PAC PT "6 Clicks" Mobility   ?Outcome Measure ? Help needed  turning from your back to your side while in a flat bed without using bedrails?: A Little ?Help needed moving from lying on your back to sitting on the side of a flat bed without using bedrails?: A Little ?Help needed moving to and from a bed to a chair (including a wheelchair)?: A Little ?Help needed standing up from a chair using your arms (e.g., wheelchair or bedside chair)?: A Lot ?Help needed to walk in hospital room?: A Lot ?Help needed climbing 3-5 steps with a railing? : A Lot ?6 Click Score: 15 ? ?  ?End of Session   ?Activity Tolerance: Patient tolerated treatment well ?Patient left: in bed;with call bell/phone within reach;with bed alarm set ?Nurse Communication: Mobility status ?PT Visit Diagnosis: Unsteadiness on feet (R26.81);Pain;Muscle weakness (generalized) (M62.81) ?Pain - Right/Left: Left ?Pain - part of body: Knee ?  ? ? ?Time: 9371-6967 ?PT Time Calculation (min) (ACUTE ONLY): 9 min ? ?Charges:  $Therapeutic Exercise: 8-22 mins          ?         Chesley Noon, PTA ?08/06/21,  12:23 PM ? ?

## 2021-08-06 NOTE — Discharge Summary (Signed)
?Physician Discharge Summary  ?Patient ID: ?[name] Woods ?MRN: [id number] ?DOB/AGE: [name] 18, 1941 81 y.o. ? ?Admit date: 08/01/2021 ?Discharge date: 08/06/2021 ? ?Admission Diagnoses:  ?S/P TKR (total knee replacement) using cement, left [Z96.652] ?S/P TKR (total knee replacement) [Z96.659] ? ?Discharge Diagnoses: ?Patient Active Problem List  ? Diagnosis Date Noted  ? S/P TKR (total knee replacement) 08/02/2021  ? S/P TKR (total knee replacement) using cement, left 08/01/2021  ? S/P TKR (total knee replacement) using cement, right 11/08/2020  ? Hip fracture ([place]) 11/10/2019  ? Closed fracture of neck of right femur ([place])   ? Acute hip pain, right   ? Generalized anxiety disorder   ? Widowed 09/23/2019  ? H/O Guillain-Barre syndrome 12/17/2018  ? Lumbar radiculopathy 12/17/2018  ? Walker as ambulation aid 10/02/2018  ? Labile hypertension 09/16/2018  ? Atypical chest pain 09/16/2018  ? Idiopathic peripheral neuropathy 01/16/2018  ? Chills 07/11/2017  ? Essential hypertension 08/03/2016  ? SOB (shortness of breath) 08/03/2016  ? Anxiety 08/03/2016  ? Leg edema 08/03/2016  ? GERD without esophagitis 11/25/2015  ? Health care maintenance 05/30/2015  ? Idiopathic progressive neuropathy 12/13/2013  ? Edema 11/14/2013  ? Menopause 11/14/2013  ? OA (osteoarthritis) 11/14/2013  ? Osteopenia 11/14/2013  ? Stenosis of cervical spine with myelopathy ([employer]) 11/14/2013  ? ? ?Past Medical History:  ?Diagnosis Date  ? Anxiety   ? Arthritis   ? Cancer [facility])   ? SKIN CA  ? Chest pain   ? a. 10/2014 St Echo: nl LV fxn, no wma/ischemia.  ? GERD (gastroesophageal reflux disease)   ? Guillain Barr? syndrome ([place])   ? Guillain Barr? syndrome ([place]) 1980  ? Hypertension   ? PONV (postoperative nausea and vomiting)   ? H/O NAUSEA  ? Spinal stenosis   ? ?  ?Transfusion: None ?  ?Consultants (if any):  ? ?Discharged Condition: Improved ? ?Hospital Course: Shifa Brisbon Stetzer is an 81 y.o. female who was admitted 08/01/2021 with a  diagnosis of S/P TKR (total knee replacement) using cement, left and went to the operating room on 08/01/2021 and underwent the above named procedures.  ?  ?Surgeries: Procedure(s): ?TOTAL KNEE ARTHROPLASTY on 08/01/2021 ?Patient tolerated the surgery well. Taken to PACU where she was stabilized and then transferred to the orthopedic floor. ? ?Started on Lovenox 30 mg q 12 hrs. Foot pumps applied bilaterally at 80 mm. Heels elevated on bed with rolled towels. No evidence of DVT. Negative [place]. ?Physical therapy started on day #1 for gait training and transfer. OT started day #1 for ADL and assisted devices. ? ?Patient's foley was d/c on day #1.  On postop day 1 patient with acute postop blood loss anemia, he hemoglobin down to 10.1.  She is started on iron supplement.  On postop day 2, hemoglobin stable at 9.5.  Patient's blood pressure soft, losartan held on postop day 1 and 2.  Patient's IV was d/c on day #2.  Patient tolerating p.o. well.  Slow progress of physical therapy.  PT recommending skilled nursing facility.  Patient stable and ready for discharge ? ?Patient did complain of increased urinary frequency/incontinence.  UA was negative for UTI and patient was able to stand and void on the bedside commode.  Bladder scan demonstrated 219m of urine, in and out cath performed and patient was started on Flomax with improvement. ? ?Would recommend external catheter at rehab as a precaution. ? ?On post op day #5 [facility] ? ? ? ?  She was given perioperative antibiotics:  ?Anti-infectives (From admission, onward)  ? ? Start     Dose/Rate Route Frequency Ordered Stop  ? 08/01/21 1630  ceFAZolin (ANCEF) IVPB 1 g/50 mL premix       ? 1 g ?100 mL/hr over 30 Minutes Intravenous Every 6 hours 08/01/21 1405 08/02/21 0632  ? 08/01/21 0828  ceFAZolin (ANCEF) 2-4 GM/100ML-% IVPB       ?Note to Pharmacy: Doreen Salvage J: cabinet override  ?    08/01/21 0828 08/01/21 1024  ?  08/01/21 0600  ceFAZolin (ANCEF) IVPB 2g/100 mL premix       ? 2 g ?200 mL/hr over 30 Minutes Intravenous On call to O.R. 07/31/21 2229 08/01/21 1038  ? ?  ?. ? ?She was given sequential compression devices, early ambulation, and Lovenox, teds for DVT prophylaxis. ? ?She benefited maximally from the hospital stay and there were no complications.   ? ?Recent vital signs:  ?Vitals:  ? 08/06/21 0324 08/06/21 0754  ?BP: (!) 124/55 (!) 140/57  ?Pulse: 81 81  ?Resp: 18 16  ?Temp: 98.1 ?F (36.7 ?C) 98.4 ?F (36.9 ?C)  ?SpO2: 95% 99%  ? ? ?Recent laboratory studies:  ?Lab Results  ?Component Value Date  ? HGB 9.5 (L) 08/03/2021  ? HGB 10.1 (L) 08/02/2021  ? HGB 13.0 08/01/2021  ? ?Lab Results  ?Component Value Date  ? WBC 11.4 (H) 08/03/2021  ? PLT 217 08/03/2021  ? ?No results found for: INR ?Lab Results  ?Component Value Date  ? NA 127 (L) 08/03/2021  ? K 4.3 08/03/2021  ? CL 92 (L) 08/03/2021  ? CO2 31 08/03/2021  ? BUN 22 08/03/2021  ? CREATININE 0.98 08/03/2021  ? GLUCOSE 110 (H) 08/03/2021  ? ? ?Discharge Medications:   ?Allergies as of 08/06/2021   ? ?   Reactions  ? Azithromycin Hives, Rash  ? Prednisone Nausea And Vomiting  ? Amlodipine Swelling  ? ?  ? ?  ?Medication List  ?  ? ?STOP taking these medications   ? ?acetaminophen 500 MG tablet ?Commonly known as: TYLENOL ?  ? ?  ? ?TAKE these medications   ? ?BIOFREEZE EX ?Apply 1 application. topically daily as needed (knee pain). ?  ?Biotin 5 MG Caps ?Take 5 mg by mouth daily. ?  ?carvedilol 12.5 MG tablet ?Commonly known as: COREG ?Take 12.5 mg by mouth in the morning and at bedtime. May take an additional 12.5 mg if blood pressure is 160 or higher ?  ?cyanocobalamin 2000 MCG tablet ?Take 2,000 mcg by mouth daily. ?  ?Dialyvite Vitamin D 5000 125 MCG (5000 UT) capsule ?Generic drug: Cholecalciferol ?Take 5,000 Units by mouth daily. ?  ?diphenhydramine-acetaminophen 25-500 MG Tabs tablet ?Commonly known as: TYLENOL PM ?Take 1 tablet by mouth at bedtime as needed  (sleep). ?  ?docusate sodium 100 MG capsule ?Commonly known as: COLACE ?Take 1 capsule (100 mg total) by mouth 2 (two) times daily. ?  ?enoxaparin 40 MG/0.4ML injection ?Commonly known as: LOVENOX ?Inject 0.4 mLs (40 mg total) into the skin daily for 14 days. ?  ?escitalopram 10 MG tablet ?Commonly known as: LEXAPRO ?Take 10 mg by mouth every morning. ?  ?estradiol 0.5 MG tablet ?Commonly known as: ESTRACE ?Take 0.5 mg by mouth in the morning. ?  ?ezetimibe 10 MG tablet ?Commonly known as: ZETIA ?Take 1 tablet (10 mg total) by mouth daily. ?  ?ferrous [id number] C-folic acid capsule ?Commonly known as: TRINSICON / FOLTRIN ?Take 1  capsule by mouth 2 (two) times daily. ?  ?gabapentin 300 MG capsule ?Commonly known as: NEURONTIN ?Take 300 mg by mouth at bedtime. ?  ?HYDROcodone-acetaminophen 5-325 MG tablet ?Commonly known as: NORCO/VICODIN ?Take 1-2 tablets by mouth every 4 (four) hours as needed for moderate pain (pain score 4-6). ?  ?losartan 25 MG tablet ?Commonly known as: COZAAR ?Take 25 mg by mouth every evening. May take an additional 25 mg if blood pressure is 160 or higher ?  ?Magnesium 500 MG Tabs ?Take 500 mg by mouth daily. ?  ?nitroGLYCERIN 0.4 MG SL tablet ?Commonly known as: NITROSTAT ?Place 1 tablet (0.4 mg total) under the tongue every 5 (five) minutes as needed for chest pain. ?  ?omeprazole 20 MG capsule ?Commonly known as: PRILOSEC ?Take 20 mg by mouth in the morning. ?  ?polyethylene glycol 17 g packet ?Commonly known as: MIRALAX / GLYCOLAX ?Take 17 g by mouth daily as needed for mild constipation. ?  ?POTASSIUM PO ?Take 1 tablet by mouth daily. 650 mg per tablet ?  ?tamsulosin 0.4 MG Caps capsule ?Commonly known as: FLOMAX ?Take 1 capsule (0.4 mg total) by mouth daily. ?  ?tiZANidine 2 MG tablet ?Commonly known as: ZANAFLEX ?Take 2 mg by mouth every evening. ?  ?torsemide 10 MG tablet ?Commonly known as: DEMADEX ?Take 10 mg by mouth in the morning. ?  ?traMADol 50 MG tablet ?Commonly  known as: ULTRAM ?Take 1 tablet (50 mg total) by mouth every 6 (six) hours as needed. ?What changed:  ?when to take this ?reasons to take this ?  ?zinc gluconate 50 MG tablet ?Take 50 mg by mouth daily. ?  ? ?  ? ?Diagno

## 2021-08-07 DIAGNOSIS — M1712 Unilateral primary osteoarthritis, left knee: Secondary | ICD-10-CM | POA: Diagnosis not present

## 2021-08-07 DIAGNOSIS — I10 Essential (primary) hypertension: Secondary | ICD-10-CM | POA: Diagnosis not present

## 2021-08-07 DIAGNOSIS — F39 Unspecified mood [affective] disorder: Secondary | ICD-10-CM | POA: Diagnosis not present

## 2021-08-07 DIAGNOSIS — M4802 Spinal stenosis, cervical region: Secondary | ICD-10-CM | POA: Diagnosis not present

## 2021-08-07 DIAGNOSIS — K219 Gastro-esophageal reflux disease without esophagitis: Secondary | ICD-10-CM | POA: Diagnosis not present

## 2021-08-22 DIAGNOSIS — R2242 Localized swelling, mass and lump, left lower limb: Secondary | ICD-10-CM | POA: Diagnosis not present

## 2021-08-24 DIAGNOSIS — F330 Major depressive disorder, recurrent, mild: Secondary | ICD-10-CM | POA: Diagnosis not present

## 2021-08-24 DIAGNOSIS — E785 Hyperlipidemia, unspecified: Secondary | ICD-10-CM | POA: Diagnosis not present

## 2021-08-29 DIAGNOSIS — Z96652 Presence of left artificial knee joint: Secondary | ICD-10-CM | POA: Diagnosis not present

## 2021-09-07 DIAGNOSIS — M1712 Unilateral primary osteoarthritis, left knee: Secondary | ICD-10-CM | POA: Diagnosis not present

## 2021-09-07 DIAGNOSIS — I10 Essential (primary) hypertension: Secondary | ICD-10-CM | POA: Diagnosis not present

## 2021-09-07 DIAGNOSIS — F411 Generalized anxiety disorder: Secondary | ICD-10-CM | POA: Diagnosis not present

## 2021-09-13 DIAGNOSIS — M1712 Unilateral primary osteoarthritis, left knee: Secondary | ICD-10-CM | POA: Diagnosis not present

## 2021-09-13 DIAGNOSIS — R2689 Other abnormalities of gait and mobility: Secondary | ICD-10-CM | POA: Diagnosis not present

## 2021-09-13 DIAGNOSIS — M8580 Other specified disorders of bone density and structure, unspecified site: Secondary | ICD-10-CM | POA: Diagnosis not present

## 2021-09-13 DIAGNOSIS — Z96652 Presence of left artificial knee joint: Secondary | ICD-10-CM | POA: Diagnosis not present

## 2021-09-13 DIAGNOSIS — M7061 Trochanteric bursitis, right hip: Secondary | ICD-10-CM | POA: Diagnosis not present

## 2021-09-13 DIAGNOSIS — M6281 Muscle weakness (generalized): Secondary | ICD-10-CM | POA: Diagnosis not present

## 2021-09-13 DIAGNOSIS — M48062 Spinal stenosis, lumbar region with neurogenic claudication: Secondary | ICD-10-CM | POA: Diagnosis not present

## 2021-09-13 DIAGNOSIS — R2681 Unsteadiness on feet: Secondary | ICD-10-CM | POA: Diagnosis not present

## 2021-10-06 DIAGNOSIS — M8580 Other specified disorders of bone density and structure, unspecified site: Secondary | ICD-10-CM | POA: Diagnosis not present

## 2021-10-06 DIAGNOSIS — M48062 Spinal stenosis, lumbar region with neurogenic claudication: Secondary | ICD-10-CM | POA: Diagnosis not present

## 2021-10-06 DIAGNOSIS — R2681 Unsteadiness on feet: Secondary | ICD-10-CM | POA: Diagnosis not present

## 2021-10-06 DIAGNOSIS — M6281 Muscle weakness (generalized): Secondary | ICD-10-CM | POA: Diagnosis not present

## 2021-10-06 DIAGNOSIS — Z96652 Presence of left artificial knee joint: Secondary | ICD-10-CM | POA: Diagnosis not present

## 2021-10-06 DIAGNOSIS — R2689 Other abnormalities of gait and mobility: Secondary | ICD-10-CM | POA: Diagnosis not present

## 2021-10-11 DIAGNOSIS — M7061 Trochanteric bursitis, right hip: Secondary | ICD-10-CM | POA: Diagnosis not present

## 2021-10-11 DIAGNOSIS — S72044S Nondisplaced fracture of base of neck of right femur, sequela: Secondary | ICD-10-CM | POA: Diagnosis not present

## 2021-10-11 DIAGNOSIS — T8484XA Pain due to internal orthopedic prosthetic devices, implants and grafts, initial encounter: Secondary | ICD-10-CM | POA: Diagnosis not present

## 2021-10-12 DIAGNOSIS — L570 Actinic keratosis: Secondary | ICD-10-CM | POA: Diagnosis not present

## 2021-10-12 DIAGNOSIS — D2262 Melanocytic nevi of left upper limb, including shoulder: Secondary | ICD-10-CM | POA: Diagnosis not present

## 2021-10-12 DIAGNOSIS — L538 Other specified erythematous conditions: Secondary | ICD-10-CM | POA: Diagnosis not present

## 2021-10-12 DIAGNOSIS — D2261 Melanocytic nevi of right upper limb, including shoulder: Secondary | ICD-10-CM | POA: Diagnosis not present

## 2021-10-12 DIAGNOSIS — D2272 Melanocytic nevi of left lower limb, including hip: Secondary | ICD-10-CM | POA: Diagnosis not present

## 2021-10-12 DIAGNOSIS — R208 Other disturbances of skin sensation: Secondary | ICD-10-CM | POA: Diagnosis not present

## 2021-10-12 DIAGNOSIS — X32XXXA Exposure to sunlight, initial encounter: Secondary | ICD-10-CM | POA: Diagnosis not present

## 2021-10-12 DIAGNOSIS — L820 Inflamed seborrheic keratosis: Secondary | ICD-10-CM | POA: Diagnosis not present

## 2021-10-12 DIAGNOSIS — Z85828 Personal history of other malignant neoplasm of skin: Secondary | ICD-10-CM | POA: Diagnosis not present

## 2021-11-01 DIAGNOSIS — M25551 Pain in right hip: Secondary | ICD-10-CM | POA: Diagnosis not present

## 2021-11-01 DIAGNOSIS — M7611 Psoas tendinitis, right hip: Secondary | ICD-10-CM | POA: Diagnosis not present

## 2021-11-19 ENCOUNTER — Telehealth: Payer: Self-pay | Admitting: Student

## 2021-11-19 NOTE — Telephone Encounter (Signed)
   The patient called the after hours line reporting elevated BP today. Her BP was at 176/92 at 1230 and at 187/77 at 1400. She reports feeling chilled when BP is elevated but no respiratory issues, headaches or visual changes. She took an extra Losartan at 1230 but was concerned when her BP did not improve. By review of her medication list, Dr. Rockey Situ previously listed she can take an extra Coreg tablet and given her stable HR in the 70's, I recommended she go ahead and take this. Did advise her to check her BP before taking her evening medications to make sure this has not declined too much.   She does have a follow-up appointment later this week and I recommended she bring her BP log and home BP cuff to the visit. She may require adjustment of her daily Losartan as she typically only takes '25mg'$  daily. She voiced understanding of this and was appreciative of the return call.   Signed, Erma Heritage, PA-C 11/19/2021, 2:44 PM

## 2021-11-23 ENCOUNTER — Encounter: Payer: Self-pay | Admitting: Nurse Practitioner

## 2021-11-23 ENCOUNTER — Ambulatory Visit: Payer: PPO | Admitting: Nurse Practitioner

## 2021-11-23 VITALS — BP 138/70 | HR 67 | Ht 61.0 in | Wt 135.0 lb

## 2021-11-23 DIAGNOSIS — R0789 Other chest pain: Secondary | ICD-10-CM | POA: Diagnosis not present

## 2021-11-23 DIAGNOSIS — E782 Mixed hyperlipidemia: Secondary | ICD-10-CM

## 2021-11-23 DIAGNOSIS — R0989 Other specified symptoms and signs involving the circulatory and respiratory systems: Secondary | ICD-10-CM

## 2021-11-23 NOTE — Patient Instructions (Signed)
Medication Instructions:  - Your physician recommends that you continue on your current medications as directed. Please refer to the Current Medication list given to you today.  *If you need a refill on your cardiac medications before your next appointment, please call your pharmacy*   Lab Work: - none ordered  If you have labs (blood work) drawn today and your tests are completely normal, you will receive your results only by: [place] (if you have MyChart) OR A paper copy in the mail If you have any lab test that is abnormal or we need to change your treatment, we will call you to review the results.   Testing/Procedures: - none ordered   Follow-Up: At [facility], you and your health needs are our priority.  As part of our continuing mission to provide you with exceptional heart care, we have created designated Provider Care Teams.  These Care Teams include your primary Cardiologist (physician) and Advanced Practice Providers (APPs -  Physician Assistants and Nurse Practitioners) who all work together to provide you with the care you need, when you need it.  We recommend signing up for the patient portal called "MyChart".  Sign up information is provided on this After Visit Summary.  MyChart is used to connect with patients for Virtual Visits (Telemedicine).  Patients are able to view lab/test results, encounter notes, upcoming appointments, etc.  Non-urgent messages can be sent to your provider as well.   To learn more about what you can do with MyChart, go to [email or web address].    Your next appointment:   4-6 month(s)  The format for your next appointment:   In Person  Provider:   You may see Ida Rogue, MD or one of the following Advanced Practice Providers on your designated Care Team:   Murray Hodgkins, NP    Other Instructions N/a  Important Information About Sugar

## 2021-11-23 NOTE — Progress Notes (Signed)
Office Visit    Patient Name: [name] Woods Date of Encounter: 11/23/2021  Primary Care Provider:  Kirk Ruths, MD Primary Cardiologist:  Ida Rogue, MD  Chief Complaint    81 year old female with a history of labile hypertension, [name] Woods, chest pain, and spinal stenosis, who presents for follow-up related to hypertension.  Past Medical History    Past Medical History:  Diagnosis Date   Anxiety    Arthritis    Cancer ([place])    SKIN CA   Chest pain    a. 10/2014 St Echo: nl LV fxn, no wma/ischemia.   GERD (gastroesophageal reflux disease)    Guillain Barr syndrome ([place]) 1980   Hypertension    PONV (postoperative nausea and vomiting)    H/O NAUSEA   Spinal stenosis    Past Surgical History:  Procedure Laterality Date   ABDOMINAL HYSTERECTOMY     AUGMENTATION MAMMAPLASTY  2006   BACK SURGERY     CERVICAL SPINE SURGERY N/A 2016   for myelopathy   COLONOSCOPY     HIP PINNING,CANNULATED Right 11/11/2019   Procedure: CANNULATED HIP PINNING;  Surgeon: [name] Knows, MD;  Location: [facility];  Service: Orthopedics;  Laterality: Right;   LUMBAR DISC SURGERY     SACROPLASTY Bilateral 04/26/2020   Procedure: SACROPLASTY;  Surgeon: [name] Knows, MD;  Location: [facility];  Service: Orthopedics;  Laterality: Bilateral;   TOTAL KNEE ARTHROPLASTY Right 11/08/2020   Procedure: TOTAL KNEE ARTHROPLASTY-RNFA;  Surgeon: [name] Knows, MD;  Location: [facility];  Service: Orthopedics;  Laterality: Right;   TOTAL KNEE ARTHROPLASTY Left 08/01/2021   Procedure: TOTAL KNEE ARTHROPLASTY;  Surgeon: [name] Knows, MD;  Location: [facility];  Service: Orthopedics;  Laterality: Left;    Allergies  Allergies  Allergen Reactions   Amlodipine Swelling   Azithromycin Hives and Rash   Prednisone Nausea And Vomiting and Other (See Comments)    History of Present Illness    81 year old female with above past medical history including hypertension, chest pain with  prior negative stress echo in 2016, anxiety, and Guillain-Barr.  She has a long history of labile hypertension which has generated anxiety in the past.  She has had multiple medication adjustments over the years and was last seen in cardiology clinic in January 2023 at which time she was maintained on carvedilol 12.5 mg twice daily, losartan 25 mg daily, and torsemide 5 mg daily.  She was advised that she can take an additional losartan for elevated blood pressures.  She was previously on hydralazine though this was discontinued sometime in 2021.  [name] Woods contacted our on-call staff on July 16 due to elevated blood pressures at home of 176/92 and 187/77.  She had already taken additional losartan and was advised to take an additional carvedilol 12.5 mg.  Since then, she returned to her usual regimen and blood pressures have been stable in the 1 20-1 30 range at home.  She denies chest pain or dyspnea.  When her blood pressure is elevated, sometimes she feels "something," which usually prompts her check her blood pressure, though she carefully describe what it is that she feels.  She denies palpitations, PND, orthopnea, dizziness, syncope, edema, or early satiety.  Activity is limited in the setting of knee pain.  Home Medications    Current Outpatient Medications  Medication Sig Dispense Refill   Biotin 5 MG CAPS Take 5 mg by mouth daily.     carvedilol (COREG) 12.5 MG tablet Take 12.5 mg  by mouth in the morning and at bedtime. May take an additional 12.5 mg if blood pressure is 160 or higher     Cholecalciferol (DIALYVITE VITAMIN D 5000) 125 MCG (5000 UT) capsule Take 5,000 Units by mouth daily.     cyanocobalamin 2000 MCG tablet Take 2,000 mcg by mouth daily.     diphenhydramine-acetaminophen (TYLENOL PM) 25-500 MG TABS tablet Take 1 tablet by mouth at bedtime as needed (sleep).     escitalopram (LEXAPRO) 10 MG tablet Take 10 mg by mouth every morning.     estradiol (ESTRACE) 0.5 MG tablet Take  0.5 mg by mouth in the morning.     ezetimibe (ZETIA) 10 MG tablet Take 1 tablet (10 mg total) by mouth daily. 90 tablet 1   gabapentin (NEURONTIN) 300 MG capsule Take 300 mg by mouth at bedtime.     losartan (COZAAR) 25 MG tablet Take 25 mg by mouth every evening. May take an additional 25 mg if blood pressure is 160 or higher     Magnesium 500 MG TABS Take 500 mg by mouth daily.     Menthol, Topical Analgesic, (BIOFREEZE EX) Apply 1 application. topically daily as needed (knee pain).     nitroGLYCERIN (NITROSTAT) 0.4 MG SL tablet Place 1 tablet (0.4 mg total) under the tongue every 5 (five) minutes as needed for chest pain. 25 tablet 3   omeprazole (PRILOSEC) 20 MG capsule Take 20 mg by mouth in the morning.     POTASSIUM PO Take 1 tablet by mouth daily. 650 mg per tablet     tiZANidine (ZANAFLEX) 2 MG tablet Take 2 mg by mouth every evening.     torsemide (DEMADEX) 10 MG tablet Take 10 mg by mouth in the morning.     zinc gluconate 50 MG tablet Take 50 mg by mouth daily.     docusate sodium (COLACE) 100 MG capsule Take 1 capsule (100 mg total) by mouth 2 (two) times daily. (Patient not taking: Reported on 11/23/2021) 10 capsule 0   enoxaparin (LOVENOX) 40 MG/0.4ML injection Inject 0.4 mLs (40 mg total) into the skin daily for 14 days. (Patient not taking: Reported on 11/23/2021) 5.6 mL 0   ferrous [id number] C-folic acid (TRINSICON / FOLTRIN) capsule Take 1 capsule by mouth 2 (two) times daily. (Patient not taking: Reported on 11/23/2021)     HYDROcodone-acetaminophen (NORCO/VICODIN) 5-325 MG tablet Take 1-2 tablets by mouth every 4 (four) hours as needed for moderate pain (pain score 4-6). (Patient not taking: Reported on 11/23/2021) 30 tablet 0   polyethylene glycol (MIRALAX / GLYCOLAX) 17 g packet Take 17 g by mouth daily as needed for mild constipation. (Patient not taking: Reported on 11/23/2021) 14 each 0   tamsulosin (FLOMAX) 0.4 MG CAPS capsule Take 1 capsule (0.4 mg total) by mouth  daily. (Patient not taking: Reported on 11/23/2021) 30 capsule 0   traMADol (ULTRAM) 50 MG tablet Take 1 tablet (50 mg total) by mouth every 6 (six) hours as needed. (Patient not taking: Reported on 11/23/2021) 30 tablet 0   No current facility-administered medications for this visit.     Review of Systems    Chronic knee pain which limits activity.  She denies chest pain, dyspnea, palpitations, PND, orthopnea, dizziness, syncope, edema, or early satiety.  All other systems reviewed and are otherwise negative except as noted above.    Physical Exam    VS:  BP 138/70   Pulse 67   Ht '5\' 1"'$  (7.619 m)  Wt 135 lb (61.2 kg)   SpO2 99%   BMI 25.51 kg/m  , BMI Body mass index is 25.51 kg/m.     Vitals:   11/23/21 1038 11/23/21 1303  BP: 140/72 138/70  Pulse: 67   SpO2: 99%     GEN: Thin, frail, in no acute distress. HEENT: normal. Neck: Supple, no JVD, carotid bruits, or masses. Cardiac: RRR, no murmurs, rubs, or gallops. No clubbing, cyanosis, edema.  Radials/PT 2+ and equal bilaterally.  Respiratory:  Respirations regular and unlabored, clear to auscultation bilaterally. GI: Soft, nontender, nondistended, BS + x 4. MS: no deformity or atrophy. Skin: warm and dry, no rash. Neuro:  Strength and sensation are intact. Psych: Normal affect.  Accessory Clinical Findings    ECG personally reviewed by me today -regular sinus rhythm, 67 - no acute changes.  Lab Results  Component Value Date   WBC 11.4 (H) 08/03/2021   HGB 9.5 (L) 08/03/2021   HCT 28.4 (L) 08/03/2021   MCV 91.0 08/03/2021   PLT 217 08/03/2021   CBC from care everywhere 09/2021: H/H 12.8/39.1, WBC 10.1, Plt 340  Lab Results  Component Value Date   CREATININE 0.98 08/03/2021   BUN 22 08/03/2021   NA 127 (L) 08/03/2021   K 4.3 08/03/2021   CL 92 (L) 08/03/2021   CO2 31 08/03/2021   Lab Results  Component Value Date   ALT 18 07/21/2021   AST 19 07/21/2021   ALKPHOS 75 07/21/2021   BILITOT 0.5 07/21/2021    Assessment & Plan    1.  Labile hypertension: Patient with a long history of labile hypertension though recently, she has been fairly well managed on beta-blocker and ARB therapy.  Over this past weekend, she had elevations in blood pressures into the 170s and 190s.  She contacted our call team and took an additional losartan followed by carvedilol with improvement.  She has since gone back to her usual regimen of carvedilol 12.5 twice daily and losartan 25 mg daily.  Blood pressures at home have been trending in the 120s to 130s and overall, she has felt well.  Blood pressure was mildly elevated 140/72 on arrival with slight improvement on repeat at 138/70.  We will continue her current regimen and discussed that if systolics are greater than 150, she may take an additional losartan, as previously recommended.  2.  History of chest pain: Negative stress echo in 2016.  No recent chest pain or significant dyspnea.  She is on beta-blocker and Zetia.  3.  HL:  On zetia.  4.  Disposition: Follow-up in 4 to 6 months or sooner if necessary.  Murray Hodgkins, NP 11/23/2021, 1:13 PM

## 2021-11-28 DIAGNOSIS — M6281 Muscle weakness (generalized): Secondary | ICD-10-CM | POA: Diagnosis not present

## 2021-11-28 DIAGNOSIS — R2689 Other abnormalities of gait and mobility: Secondary | ICD-10-CM | POA: Diagnosis not present

## 2021-11-28 DIAGNOSIS — M25551 Pain in right hip: Secondary | ICD-10-CM | POA: Diagnosis not present

## 2021-11-28 DIAGNOSIS — M7061 Trochanteric bursitis, right hip: Secondary | ICD-10-CM | POA: Diagnosis not present

## 2021-11-28 DIAGNOSIS — T8484XD Pain due to internal orthopedic prosthetic devices, implants and grafts, subsequent encounter: Secondary | ICD-10-CM | POA: Diagnosis not present

## 2021-11-28 DIAGNOSIS — R2681 Unsteadiness on feet: Secondary | ICD-10-CM | POA: Diagnosis not present

## 2021-11-28 DIAGNOSIS — S72044S Nondisplaced fracture of base of neck of right femur, sequela: Secondary | ICD-10-CM | POA: Diagnosis not present

## 2021-12-04 ENCOUNTER — Ambulatory Visit: Payer: PPO | Admitting: Medical

## 2021-12-06 DIAGNOSIS — R609 Edema, unspecified: Secondary | ICD-10-CM | POA: Diagnosis not present

## 2021-12-06 DIAGNOSIS — I10 Essential (primary) hypertension: Secondary | ICD-10-CM | POA: Diagnosis not present

## 2021-12-06 DIAGNOSIS — R7309 Other abnormal glucose: Secondary | ICD-10-CM | POA: Diagnosis not present

## 2021-12-12 DIAGNOSIS — R2689 Other abnormalities of gait and mobility: Secondary | ICD-10-CM | POA: Diagnosis not present

## 2021-12-12 DIAGNOSIS — S72044S Nondisplaced fracture of base of neck of right femur, sequela: Secondary | ICD-10-CM | POA: Diagnosis not present

## 2021-12-12 DIAGNOSIS — G992 Myelopathy in diseases classified elsewhere: Secondary | ICD-10-CM | POA: Diagnosis not present

## 2021-12-12 DIAGNOSIS — I10 Essential (primary) hypertension: Secondary | ICD-10-CM | POA: Diagnosis not present

## 2021-12-12 DIAGNOSIS — M7061 Trochanteric bursitis, right hip: Secondary | ICD-10-CM | POA: Diagnosis not present

## 2021-12-12 DIAGNOSIS — M25551 Pain in right hip: Secondary | ICD-10-CM | POA: Diagnosis not present

## 2021-12-12 DIAGNOSIS — F411 Generalized anxiety disorder: Secondary | ICD-10-CM | POA: Diagnosis not present

## 2021-12-12 DIAGNOSIS — M4802 Spinal stenosis, cervical region: Secondary | ICD-10-CM | POA: Diagnosis not present

## 2021-12-12 DIAGNOSIS — Z9889 Other specified postprocedural states: Secondary | ICD-10-CM | POA: Diagnosis not present

## 2021-12-12 DIAGNOSIS — R2681 Unsteadiness on feet: Secondary | ICD-10-CM | POA: Diagnosis not present

## 2021-12-12 DIAGNOSIS — M6281 Muscle weakness (generalized): Secondary | ICD-10-CM | POA: Diagnosis not present

## 2021-12-12 DIAGNOSIS — R7303 Prediabetes: Secondary | ICD-10-CM | POA: Diagnosis not present

## 2021-12-12 DIAGNOSIS — T8484XD Pain due to internal orthopedic prosthetic devices, implants and grafts, subsequent encounter: Secondary | ICD-10-CM | POA: Diagnosis not present

## 2021-12-12 DIAGNOSIS — Z0000 Encounter for general adult medical examination without abnormal findings: Secondary | ICD-10-CM | POA: Diagnosis not present

## 2021-12-18 ENCOUNTER — Ambulatory Visit: Payer: PPO | Admitting: Podiatry

## 2021-12-18 DIAGNOSIS — B351 Tinea unguium: Secondary | ICD-10-CM

## 2021-12-18 DIAGNOSIS — D2371 Other benign neoplasm of skin of right lower limb, including hip: Secondary | ICD-10-CM

## 2021-12-18 DIAGNOSIS — M79676 Pain in unspecified toe(s): Secondary | ICD-10-CM

## 2021-12-18 NOTE — Progress Notes (Signed)
She presents today with her walker concerned that her toenails are long and she also has a painful callus beneath the first metatarsal head of the right foot.  She states has been bothering her for quite some time she would like to have it checked.  Objective: Vital signs are stable and oriented x3 presents with a wheeled walker today as unassisted pulses are palpable neurologic sensorium is intact Deetjen reflexes are intact she does have a little bit of diminished sensorium to the left foot and leg more so than the right.  Otherwise toenails are long thick yellow dystrophic Lee mycotic a subfirst metatarsal sesamoid lesion but not open.  Assessment: Benign skin lesions first right and pain in limb secondary to onychomycosis.  Plan: Debridement of toenails 1 through 5 bilateral debridement of reactive hyperkeratotic lesion benign in nature right.  Follow-up 3 months

## 2021-12-19 DIAGNOSIS — L298 Other pruritus: Secondary | ICD-10-CM | POA: Diagnosis not present

## 2021-12-19 DIAGNOSIS — L309 Dermatitis, unspecified: Secondary | ICD-10-CM | POA: Diagnosis not present

## 2021-12-26 DIAGNOSIS — Z961 Presence of intraocular lens: Secondary | ICD-10-CM | POA: Diagnosis not present

## 2021-12-26 DIAGNOSIS — H35363 Drusen (degenerative) of macula, bilateral: Secondary | ICD-10-CM | POA: Diagnosis not present

## 2021-12-27 DIAGNOSIS — D1801 Hemangioma of skin and subcutaneous tissue: Secondary | ICD-10-CM | POA: Diagnosis not present

## 2021-12-27 DIAGNOSIS — L820 Inflamed seborrheic keratosis: Secondary | ICD-10-CM | POA: Diagnosis not present

## 2021-12-27 DIAGNOSIS — L210 Seborrhea capitis: Secondary | ICD-10-CM | POA: Diagnosis not present

## 2021-12-27 DIAGNOSIS — L570 Actinic keratosis: Secondary | ICD-10-CM | POA: Diagnosis not present

## 2021-12-27 DIAGNOSIS — L814 Other melanin hyperpigmentation: Secondary | ICD-10-CM | POA: Diagnosis not present

## 2022-01-10 DIAGNOSIS — M6281 Muscle weakness (generalized): Secondary | ICD-10-CM | POA: Diagnosis not present

## 2022-01-10 DIAGNOSIS — M7061 Trochanteric bursitis, right hip: Secondary | ICD-10-CM | POA: Diagnosis not present

## 2022-01-10 DIAGNOSIS — R2689 Other abnormalities of gait and mobility: Secondary | ICD-10-CM | POA: Diagnosis not present

## 2022-01-10 DIAGNOSIS — M25551 Pain in right hip: Secondary | ICD-10-CM | POA: Diagnosis not present

## 2022-01-10 DIAGNOSIS — R2681 Unsteadiness on feet: Secondary | ICD-10-CM | POA: Diagnosis not present

## 2022-01-10 DIAGNOSIS — S72044S Nondisplaced fracture of base of neck of right femur, sequela: Secondary | ICD-10-CM | POA: Diagnosis not present

## 2022-01-10 DIAGNOSIS — T8484XD Pain due to internal orthopedic prosthetic devices, implants and grafts, subsequent encounter: Secondary | ICD-10-CM | POA: Diagnosis not present

## 2022-02-07 DIAGNOSIS — S72044S Nondisplaced fracture of base of neck of right femur, sequela: Secondary | ICD-10-CM | POA: Diagnosis not present

## 2022-02-07 DIAGNOSIS — R2681 Unsteadiness on feet: Secondary | ICD-10-CM | POA: Diagnosis not present

## 2022-02-07 DIAGNOSIS — M25551 Pain in right hip: Secondary | ICD-10-CM | POA: Diagnosis not present

## 2022-02-07 DIAGNOSIS — M7061 Trochanteric bursitis, right hip: Secondary | ICD-10-CM | POA: Diagnosis not present

## 2022-02-07 DIAGNOSIS — T8484XD Pain due to internal orthopedic prosthetic devices, implants and grafts, subsequent encounter: Secondary | ICD-10-CM | POA: Diagnosis not present

## 2022-02-07 DIAGNOSIS — M6281 Muscle weakness (generalized): Secondary | ICD-10-CM | POA: Diagnosis not present

## 2022-02-07 DIAGNOSIS — R2689 Other abnormalities of gait and mobility: Secondary | ICD-10-CM | POA: Diagnosis not present

## 2022-02-20 ENCOUNTER — Other Ambulatory Visit: Payer: Self-pay | Admitting: Internal Medicine

## 2022-02-20 DIAGNOSIS — Z1231 Encounter for screening mammogram for malignant neoplasm of breast: Secondary | ICD-10-CM

## 2022-02-28 DIAGNOSIS — L210 Seborrhea capitis: Secondary | ICD-10-CM | POA: Diagnosis not present

## 2022-02-28 DIAGNOSIS — L820 Inflamed seborrheic keratosis: Secondary | ICD-10-CM | POA: Diagnosis not present

## 2022-02-28 DIAGNOSIS — L814 Other melanin hyperpigmentation: Secondary | ICD-10-CM | POA: Diagnosis not present

## 2022-02-28 DIAGNOSIS — D1801 Hemangioma of skin and subcutaneous tissue: Secondary | ICD-10-CM | POA: Diagnosis not present

## 2022-02-28 DIAGNOSIS — L821 Other seborrheic keratosis: Secondary | ICD-10-CM | POA: Diagnosis not present

## 2022-02-28 DIAGNOSIS — L299 Pruritus, unspecified: Secondary | ICD-10-CM | POA: Diagnosis not present

## 2022-03-07 DIAGNOSIS — M25551 Pain in right hip: Secondary | ICD-10-CM | POA: Diagnosis not present

## 2022-03-07 DIAGNOSIS — S72044S Nondisplaced fracture of base of neck of right femur, sequela: Secondary | ICD-10-CM | POA: Diagnosis not present

## 2022-03-07 DIAGNOSIS — T8484XD Pain due to internal orthopedic prosthetic devices, implants and grafts, subsequent encounter: Secondary | ICD-10-CM | POA: Diagnosis not present

## 2022-03-07 DIAGNOSIS — R2681 Unsteadiness on feet: Secondary | ICD-10-CM | POA: Diagnosis not present

## 2022-03-07 DIAGNOSIS — M6281 Muscle weakness (generalized): Secondary | ICD-10-CM | POA: Diagnosis not present

## 2022-03-07 DIAGNOSIS — R2689 Other abnormalities of gait and mobility: Secondary | ICD-10-CM | POA: Diagnosis not present

## 2022-03-07 DIAGNOSIS — M7061 Trochanteric bursitis, right hip: Secondary | ICD-10-CM | POA: Diagnosis not present

## 2022-03-15 ENCOUNTER — Other Ambulatory Visit: Payer: Self-pay | Admitting: Cardiovascular Disease

## 2022-03-21 ENCOUNTER — Ambulatory Visit: Payer: PPO | Admitting: Podiatry

## 2022-03-21 ENCOUNTER — Ambulatory Visit
Admission: RE | Admit: 2022-03-21 | Discharge: 2022-03-21 | Disposition: A | Discharge status: Home / Self Care | Payer: PPO | Source: Ambulatory Visit | Attending: Internal Medicine | Admitting: Internal Medicine

## 2022-03-21 DIAGNOSIS — Z1231 Encounter for screening mammogram for malignant neoplasm of breast: Secondary | ICD-10-CM | POA: Diagnosis not present

## 2022-04-11 DIAGNOSIS — R2689 Other abnormalities of gait and mobility: Secondary | ICD-10-CM | POA: Diagnosis not present

## 2022-04-11 DIAGNOSIS — R2681 Unsteadiness on feet: Secondary | ICD-10-CM | POA: Diagnosis not present

## 2022-04-11 DIAGNOSIS — M6281 Muscle weakness (generalized): Secondary | ICD-10-CM | POA: Diagnosis not present

## 2022-04-11 DIAGNOSIS — M7061 Trochanteric bursitis, right hip: Secondary | ICD-10-CM | POA: Diagnosis not present

## 2022-04-11 DIAGNOSIS — M25551 Pain in right hip: Secondary | ICD-10-CM | POA: Diagnosis not present

## 2022-04-11 DIAGNOSIS — T8484XD Pain due to internal orthopedic prosthetic devices, implants and grafts, subsequent encounter: Secondary | ICD-10-CM | POA: Diagnosis not present

## 2022-04-11 DIAGNOSIS — S72044S Nondisplaced fracture of base of neck of right femur, sequela: Secondary | ICD-10-CM | POA: Diagnosis not present

## 2022-05-09 DIAGNOSIS — R2689 Other abnormalities of gait and mobility: Secondary | ICD-10-CM | POA: Diagnosis not present

## 2022-05-09 DIAGNOSIS — M7061 Trochanteric bursitis, right hip: Secondary | ICD-10-CM | POA: Diagnosis not present

## 2022-05-09 DIAGNOSIS — T8484XD Pain due to internal orthopedic prosthetic devices, implants and grafts, subsequent encounter: Secondary | ICD-10-CM | POA: Diagnosis not present

## 2022-05-09 DIAGNOSIS — S72044S Nondisplaced fracture of base of neck of right femur, sequela: Secondary | ICD-10-CM | POA: Diagnosis not present

## 2022-05-09 DIAGNOSIS — M6281 Muscle weakness (generalized): Secondary | ICD-10-CM | POA: Diagnosis not present

## 2022-05-09 DIAGNOSIS — M25551 Pain in right hip: Secondary | ICD-10-CM | POA: Diagnosis not present

## 2022-05-09 DIAGNOSIS — R2681 Unsteadiness on feet: Secondary | ICD-10-CM | POA: Diagnosis not present

## 2022-05-28 DIAGNOSIS — Z961 Presence of intraocular lens: Secondary | ICD-10-CM | POA: Diagnosis not present

## 2022-05-28 DIAGNOSIS — H35363 Drusen (degenerative) of macula, bilateral: Secondary | ICD-10-CM | POA: Diagnosis not present

## 2022-05-28 DIAGNOSIS — G43109 Migraine with aura, not intractable, without status migrainosus: Secondary | ICD-10-CM | POA: Diagnosis not present

## 2022-06-05 DIAGNOSIS — B351 Tinea unguium: Secondary | ICD-10-CM | POA: Diagnosis not present

## 2022-06-05 DIAGNOSIS — I7091 Generalized atherosclerosis: Secondary | ICD-10-CM | POA: Diagnosis not present

## 2022-06-07 DIAGNOSIS — R7303 Prediabetes: Secondary | ICD-10-CM | POA: Diagnosis not present

## 2022-06-07 DIAGNOSIS — I10 Essential (primary) hypertension: Secondary | ICD-10-CM | POA: Diagnosis not present

## 2022-06-10 NOTE — Progress Notes (Unsigned)
Date:  06/11/2022   ID:  [name] Woods 1940/05/21, MRN [id number]  Patient Location:  [facility] [address]   Provider location:   [facility], [place] office  PCP:  Kirk Ruths, MD  Cardiologist:  [facility]   Chief Complaint  Patient presents with   6 month follow up     "Doing well." Medications reviewed by the patient verbally.      History of Present Illness:    [name] Woods is a 82 y.o. female  past medical history of Labile Hypertension, causing significant anxiety in the past  Anxiety Guillain-Barre syndrome  Chest pain Previously seen by [facility] cardiology in 2017 Negative stress test 2016 She presents for follow up of her labile hypertension  Last seen by myself in clinic June 06, 2021 Seen by one of our providers July 2023 In follow-up today reports she has been doing relatively well, trying to stay active, does swimming once a week, recumbent bike Still with some gait instability, presents in wheelchair today  In general has not been checking her blood pressure Yesterday late afternoon, BP elevated 158 High this Am well Continues to take carvedilol 12.5 twice daily with losartan 20 5 in the evening  Continues on torsemide 10 every morning, denies significant leg swelling  Prior CT scan knee  Advanced 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  Surgery 3/23 knee surgery  Continues to live at [facility] work reviewed Normal BMP Total cholesterol 246 LDL 153 Started on Zetia total cholesterol down to 200  EKG personally reviewed by myself on todays visit Normal sinus rhythm rate 65 bpm no significant ST-T wave changes  Other past medical history reviewed CT chest 2018 with mild aortic atherosclerosis in the aorta, predominantly in the arch Images pulled up and reviewed, mild aortic  atherosclerosis noted  amlodipine held for leg swelling Not checking pressure at home  Previous workup includes Stress echocardiogram 10/28/2014 with normal LV function, no ischemia   Other past medical history includes Spinal stenosis, back pain, Cervical surgery Trouble walking Legs heavy   Mom with dementia, Father with FTT   Past Medical History:  Diagnosis Date   Anxiety    Arthritis    Cancer ([place])    SKIN CA   Chest pain    a. 10/2014 St Echo: nl LV fxn, no wma/ischemia.   GERD (gastroesophageal reflux disease)    Guillain Barr syndrome ([place]) 1980   Hypertension    PONV (postoperative nausea and vomiting)    H/O NAUSEA   Spinal stenosis    Past Surgical History:  Procedure Laterality Date   ABDOMINAL HYSTERECTOMY     AUGMENTATION MAMMAPLASTY  2006   BACK SURGERY     CERVICAL SPINE SURGERY N/A 2016   for myelopathy   COLONOSCOPY     HIP PINNING,CANNULATED Right 11/11/2019   Procedure: CANNULATED HIP PINNING;  Surgeon: Hessie Knows, MD;  Location: [facility];  Service: Orthopedics;  Laterality: Right;   LUMBAR DISC SURGERY     SACROPLASTY Bilateral 04/26/2020   Procedure: SACROPLASTY;  Surgeon: Hessie Knows, MD;  Location: [facility];  Service: Orthopedics;  Laterality: Bilateral;   TOTAL KNEE ARTHROPLASTY Right 11/08/2020   Procedure: TOTAL KNEE ARTHROPLASTY-RNFA;  Surgeon: Hessie Knows, MD;  Location: [facility];  Service: Orthopedics;  Laterality: Right;   TOTAL KNEE ARTHROPLASTY Left 08/01/2021   Procedure: TOTAL  KNEE ARTHROPLASTY;  Surgeon: Hessie Knows, MD;  Location: [facility];  Service: Orthopedics;  Laterality: Left;     Current Meds  Medication Sig   carvedilol (COREG) 12.5 MG tablet Take 12.5 mg by mouth in the morning and at bedtime. May take an additional 12.5 mg if blood pressure is 160 or higher   cyanocobalamin 2000 MCG tablet Take 2,000 mcg by mouth daily.   diphenhydramine-acetaminophen (TYLENOL PM) 25-500 MG TABS tablet Take 1 tablet by  mouth at bedtime as needed (sleep).   escitalopram (LEXAPRO) 10 MG tablet Take 10 mg by mouth every morning.   estradiol (ESTRACE) 0.5 MG tablet Take 0.5 mg by mouth in the morning.   ezetimibe (ZETIA) 10 MG tablet TAKE 1 TABLET BY MOUTH DAILY   gabapentin (NEURONTIN) 300 MG capsule Take 300 mg by mouth at bedtime.   losartan (COZAAR) 25 MG tablet Take 25 mg by mouth every evening. May take an additional 25 mg if blood pressure is 160 or higher   Magnesium 500 MG TABS Take 500 mg by mouth daily.   Menthol, Topical Analgesic, (BIOFREEZE EX) Apply 1 application. topically daily as needed (knee pain).   nitroGLYCERIN (NITROSTAT) 0.4 MG SL tablet Place 1 tablet (0.4 mg total) under the tongue every 5 (five) minutes as needed for chest pain.   omeprazole (PRILOSEC) 20 MG capsule Take 20 mg by mouth in the morning.   tiZANidine (ZANAFLEX) 2 MG tablet Take 2 mg by mouth every evening.   torsemide (DEMADEX) 10 MG tablet Take 10 mg by mouth in the morning.     Allergies:   Amlodipine, Azithromycin, and Prednisone   Social History   Tobacco Use   Smoking status: Never   Smokeless tobacco: Never  Vaping Use   Vaping Use: Never used  Substance Use Topics   Alcohol use: Yes    Alcohol/week: 1.0 standard drink of alcohol    Types: 1 Glasses of wine per week    Comment: occasionally   Drug use: No      Family Hx: The patient's family history includes Hypertension in her father and mother. There is no history of Breast cancer.  ROS:   Please see the history of present illness.    Review of Systems  Constitutional: Negative.   HENT: Negative.    Respiratory: Negative.    Cardiovascular: Negative.   Gastrointestinal: Negative.   Musculoskeletal: Negative.   Neurological: Negative.   Psychiatric/Behavioral: Negative.    All other systems reviewed and are negative.    Labs/Other Tests and Data Reviewed:    Recent Labs: 07/21/2021: ALT 18 08/03/2021: BUN 22; Creatinine, Ser 0.98;  Hemoglobin 9.5; Platelets 217; Potassium 4.3; Sodium 127   Recent Lipid Panel No results found for: "CHOL", "TRIG", "HDL", "CHOLHDL", "LDLCALC", "LDLDIRECT"  Wt Readings from Last 3 Encounters:  06/11/22 141 lb 8 oz (64.2 kg)  11/23/21 135 lb (61.2 kg)  08/01/21 136 lb (61.7 kg)     Exam:    Vital Signs: Vital signs may also be detailed in the HPI BP (!) 150/78 (BP Location: Left Arm, Patient Position: Sitting, Cuff Size: Normal)   Pulse 65   Ht '5\' 2"'$  (1.575 m)   Wt 141 lb 8 oz (64.2 kg)   SpO2 97%   BMI 25.88 kg/m   Constitutional:  oriented to person, place, and time. No distress.  In a wheelchair HENT:  Head: Grossly normal Eyes:  no discharge. No scleral icterus.  Neck: No JVD, no carotid bruits  Cardiovascular: Regular rate and rhythm, 6-3/1 systolic ejection murmur right sternal border Pulmonary/Chest: Clear to auscultation bilaterally, no wheezes or rails Abdominal: Soft.  no distension.  no tenderness.  Musculoskeletal: Normal range of motion Neurological:  normal muscle tone. Coordination normal. No atrophy Skin: Skin warm and dry Psychiatric: normal affect, pleasant   ASSESSMENT & PLAN:    Labile hypertension taking Coreg 12.5 twice daily losartan25  Recommend she continue to monitor blood pressure, take losartan 25 twice daily for systolic pressure greater than 140s She is off hydralazine, continue torsemide  Leg edema Avoid calcium channel blockers Torsemide low dose daily, stable BMP No significant edema on today's visit  Anxiety Periods of anxiety, managed by primary care Sometimes when anxious blood pressure will run high  Atypical chest pain No further episodes  Hyperlipidemia Tolerating zetia 10 mg daily, total cholesterol down 50 points  Aortic atherosclerosis Mild, predominantly in the arch, picture shown to her,  Continue Zetia, previously declined statin   Total encounter time more than 30 minutes  Greater than 50% was spent in  counseling and coordination of care with the patient   Disposition: Follow-up in 12 months   Signed, Ida Rogue, MD  06/11/2022 12:43 PM    [facility] [address] [place] [address], [place], [place] [address]

## 2022-06-11 ENCOUNTER — Encounter: Payer: Self-pay | Admitting: Cardiovascular Disease

## 2022-06-11 ENCOUNTER — Ambulatory Visit: Payer: PPO | Attending: Cardiovascular Disease | Admitting: Cardiovascular Disease

## 2022-06-11 VITALS — BP 150/78 | HR 65 | Ht 62.0 in | Wt 141.5 lb

## 2022-06-11 DIAGNOSIS — R0989 Other specified symptoms and signs involving the circulatory and respiratory systems: Secondary | ICD-10-CM | POA: Diagnosis not present

## 2022-06-11 DIAGNOSIS — I700 Atherosclerosis of aorta: Secondary | ICD-10-CM | POA: Diagnosis not present

## 2022-06-11 DIAGNOSIS — G603 Idiopathic progressive neuropathy: Secondary | ICD-10-CM | POA: Diagnosis not present

## 2022-06-11 DIAGNOSIS — R0602 Shortness of breath: Secondary | ICD-10-CM

## 2022-06-11 DIAGNOSIS — E782 Mixed hyperlipidemia: Secondary | ICD-10-CM

## 2022-06-11 DIAGNOSIS — R600 Localized edema: Secondary | ICD-10-CM

## 2022-06-11 DIAGNOSIS — R0789 Other chest pain: Secondary | ICD-10-CM

## 2022-06-11 MED ORDER — CARVEDILOL 12.5 MG PO TABS: 12.5000 mg | ORAL_TABLET | Freq: Two times a day (BID) | ORAL | 5 refills | Status: DC

## 2022-06-11 MED ORDER — LOSARTAN POTASSIUM 25 MG PO TABS: 25.0000 mg | ORAL_TABLET | Freq: Every evening | ORAL | 5 refills | Status: DC

## 2022-06-11 NOTE — Patient Instructions (Signed)
Medication Instructions:  No changes  If you need a refill on your cardiac medications before your next appointment, please call your pharmacy.   Lab work: No new labs needed  Testing/Procedures: No new testing needed  Follow-Up: At [facility], you and your health needs are our priority.  As part of our continuing mission to provide you with exceptional heart care, we have created designated Provider Care Teams.  These Care Teams include your primary Cardiologist (physician) and Advanced Practice Providers (APPs -  Physician Assistants and Nurse Practitioners) who all work together to provide you with the care you need, when you need it.  You will need a follow up appointment in 12 months  Providers on your designated Care Team:   Christopher Berge, NP Ryan Dunn, PA-C Cadence Furth, PA-C  COVID-19 Vaccine Information can be found at: [email or web address] For questions related to vaccine distribution or appointments, please email [email or web address] or call [phone].   

## 2022-06-13 DIAGNOSIS — M6281 Muscle weakness (generalized): Secondary | ICD-10-CM | POA: Diagnosis not present

## 2022-06-13 DIAGNOSIS — R2681 Unsteadiness on feet: Secondary | ICD-10-CM | POA: Diagnosis not present

## 2022-06-13 DIAGNOSIS — M25551 Pain in right hip: Secondary | ICD-10-CM | POA: Diagnosis not present

## 2022-06-13 DIAGNOSIS — S72044S Nondisplaced fracture of base of neck of right femur, sequela: Secondary | ICD-10-CM | POA: Diagnosis not present

## 2022-06-13 DIAGNOSIS — T8484XD Pain due to internal orthopedic prosthetic devices, implants and grafts, subsequent encounter: Secondary | ICD-10-CM | POA: Diagnosis not present

## 2022-06-13 DIAGNOSIS — R2689 Other abnormalities of gait and mobility: Secondary | ICD-10-CM | POA: Diagnosis not present

## 2022-06-13 DIAGNOSIS — M7061 Trochanteric bursitis, right hip: Secondary | ICD-10-CM | POA: Diagnosis not present

## 2022-06-14 DIAGNOSIS — I10 Essential (primary) hypertension: Secondary | ICD-10-CM | POA: Diagnosis not present

## 2022-06-14 DIAGNOSIS — F411 Generalized anxiety disorder: Secondary | ICD-10-CM | POA: Diagnosis not present

## 2022-06-14 DIAGNOSIS — M4802 Spinal stenosis, cervical region: Secondary | ICD-10-CM | POA: Diagnosis not present

## 2022-06-14 DIAGNOSIS — G992 Myelopathy in diseases classified elsewhere: Secondary | ICD-10-CM | POA: Diagnosis not present

## 2022-06-14 DIAGNOSIS — R7303 Prediabetes: Secondary | ICD-10-CM | POA: Diagnosis not present

## 2022-06-14 DIAGNOSIS — G603 Idiopathic progressive neuropathy: Secondary | ICD-10-CM | POA: Diagnosis not present

## 2022-06-14 DIAGNOSIS — K219 Gastro-esophageal reflux disease without esophagitis: Secondary | ICD-10-CM | POA: Diagnosis not present

## 2022-06-15 ENCOUNTER — Other Ambulatory Visit: Payer: Self-pay | Admitting: Cardiovascular Disease

## 2022-06-25 DIAGNOSIS — L814 Other melanin hyperpigmentation: Secondary | ICD-10-CM | POA: Diagnosis not present

## 2022-06-25 DIAGNOSIS — L820 Inflamed seborrheic keratosis: Secondary | ICD-10-CM | POA: Diagnosis not present

## 2022-06-25 DIAGNOSIS — L853 Xerosis cutis: Secondary | ICD-10-CM | POA: Diagnosis not present

## 2022-06-25 DIAGNOSIS — L821 Other seborrheic keratosis: Secondary | ICD-10-CM | POA: Diagnosis not present

## 2022-06-25 DIAGNOSIS — L570 Actinic keratosis: Secondary | ICD-10-CM | POA: Diagnosis not present

## 2022-07-02 IMAGING — MG MM DIGITAL DIAGNOSTIC UNILAT*R* IMPLANT W/ TOMO W/ CAD
8 series · 8 of 20 positions shown · non-contrast
Comparison: Previous exam(s).

CLINICAL DATA: 80-year-old female with a palpable lump along the
upper right breast which has since resolved.

EXAM:
DIGITAL DIAGNOSTIC UNILATERAL RIGHT MAMMOGRAM WITH IMPLANTS, CAD AND
TOMOSYNTHESIS
TECHNIQUE: Right digital diagnostic mammography and breast tomosynthesis was
performed. The images were evaluated with computer-aided detection.
Standard and/or implant displaced views were performed.

[R MLO]
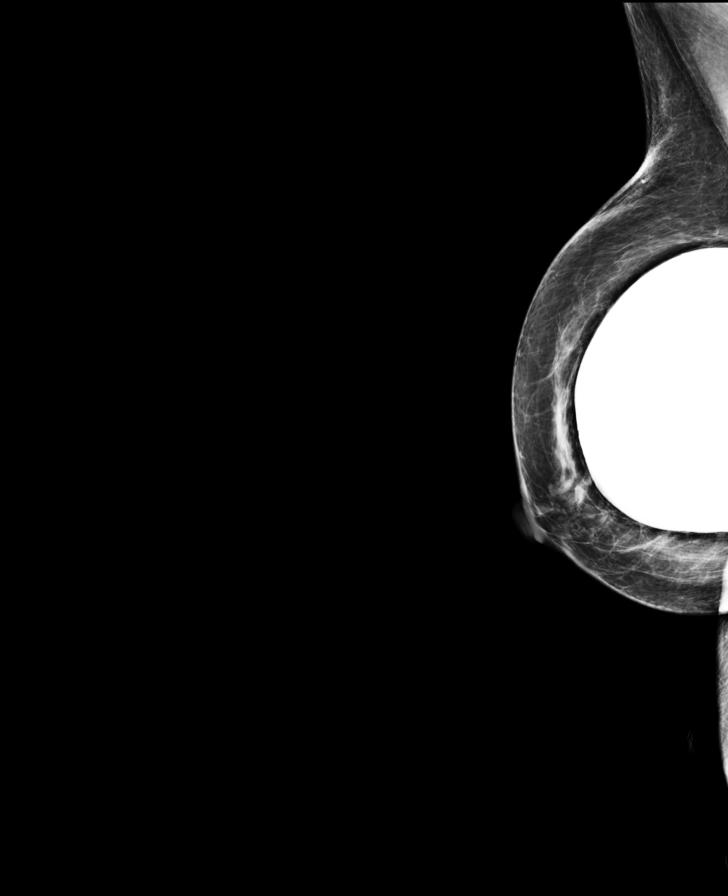

[R CC]
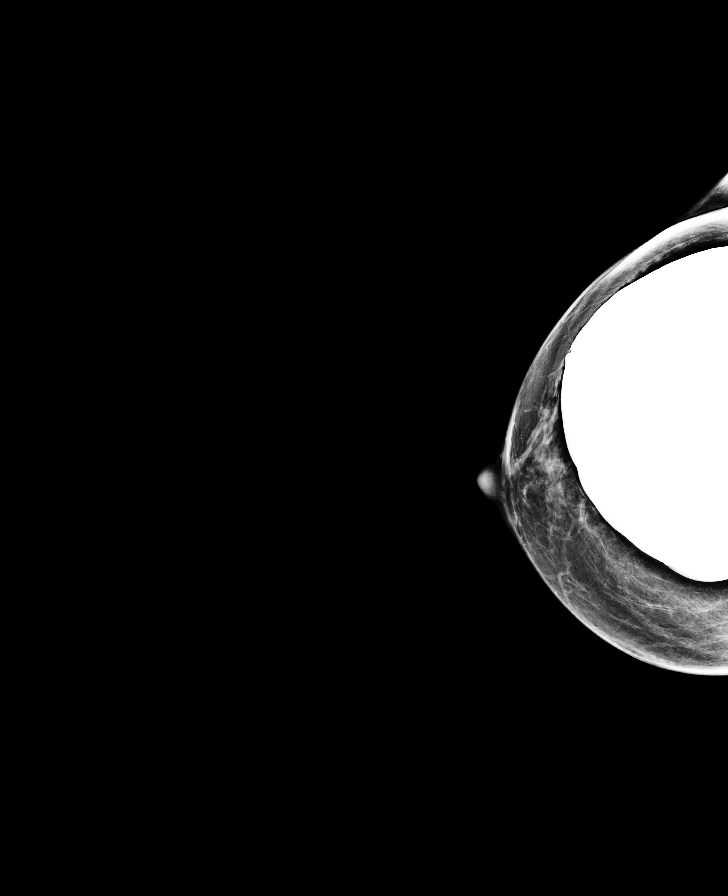

[R CC synth-2D]
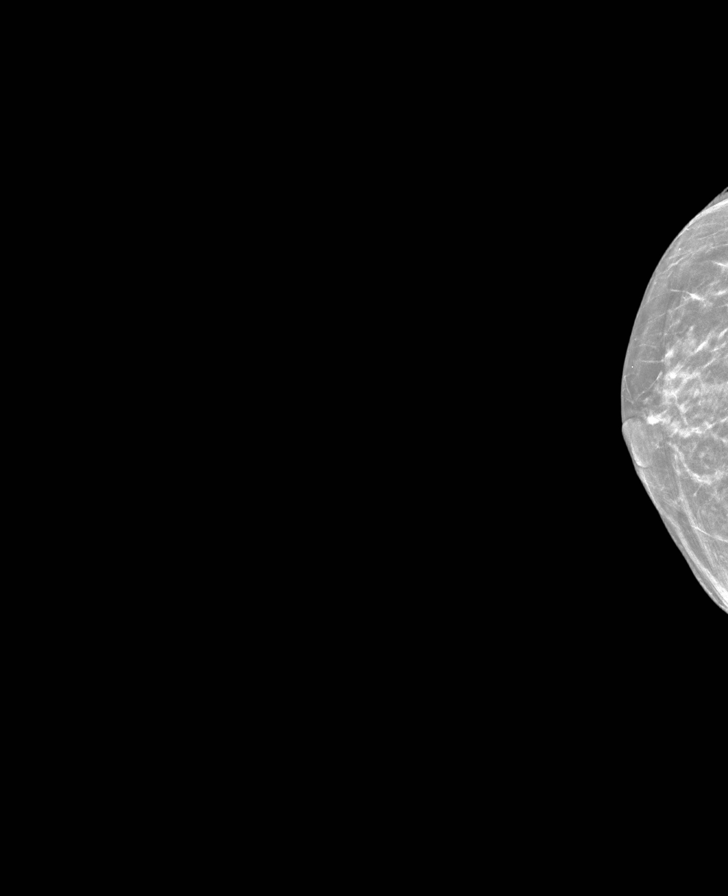

[R MLO synth-2D (1 of 2)]
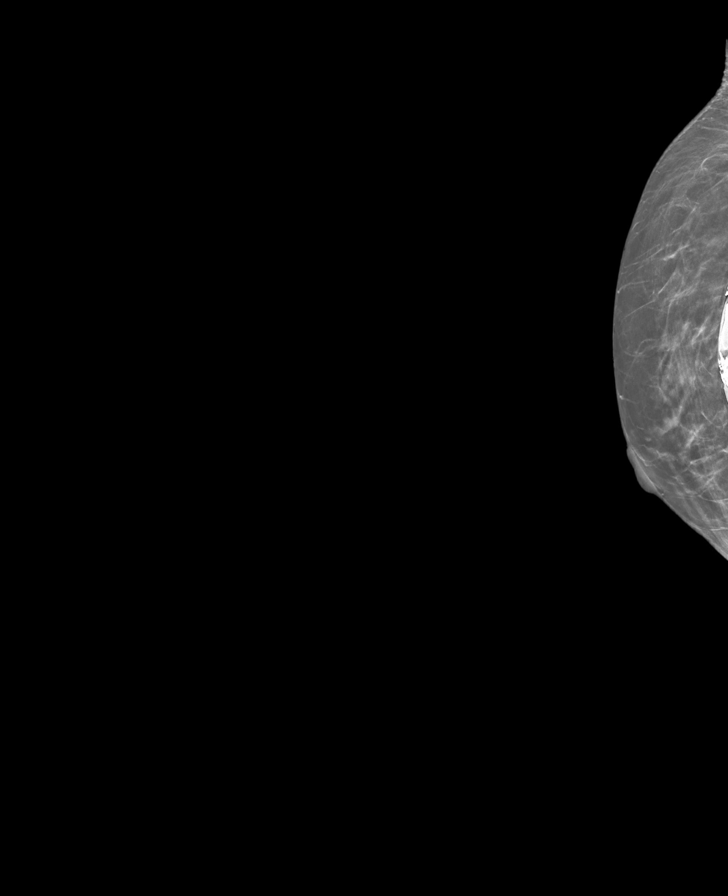

[R MLO synth-2D (2 of 2)]
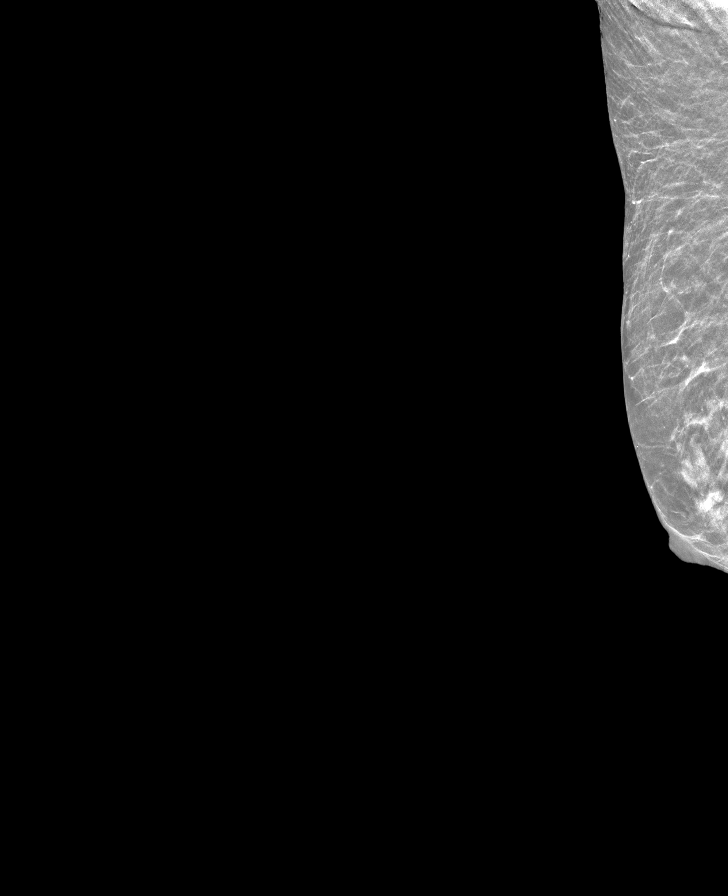

[R MLO tomo (1 of 2) · tomo slice 20/39.0]
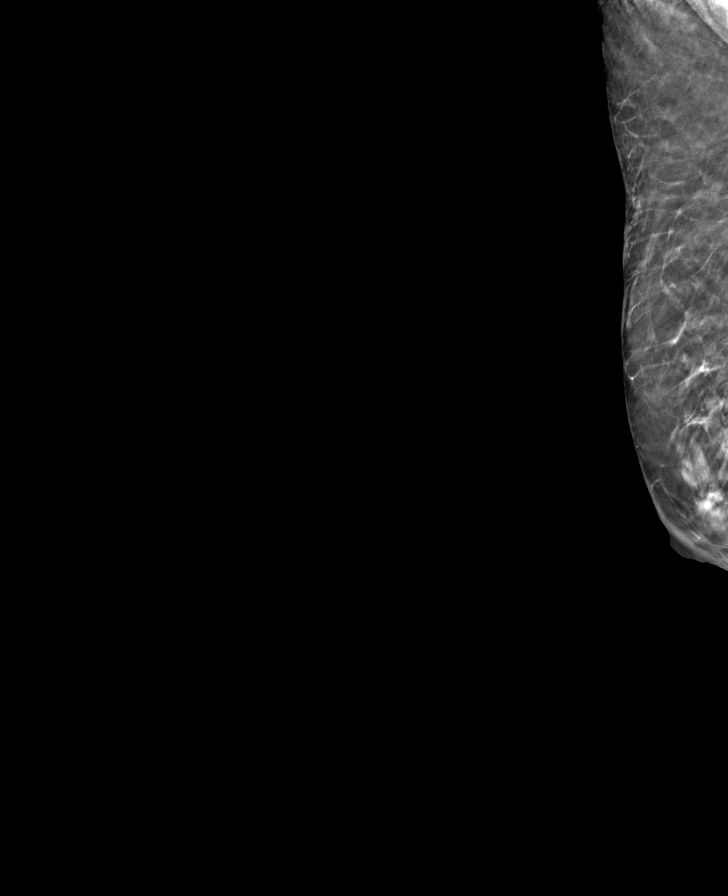

[R CC tomo · tomo slice 20/39.0]
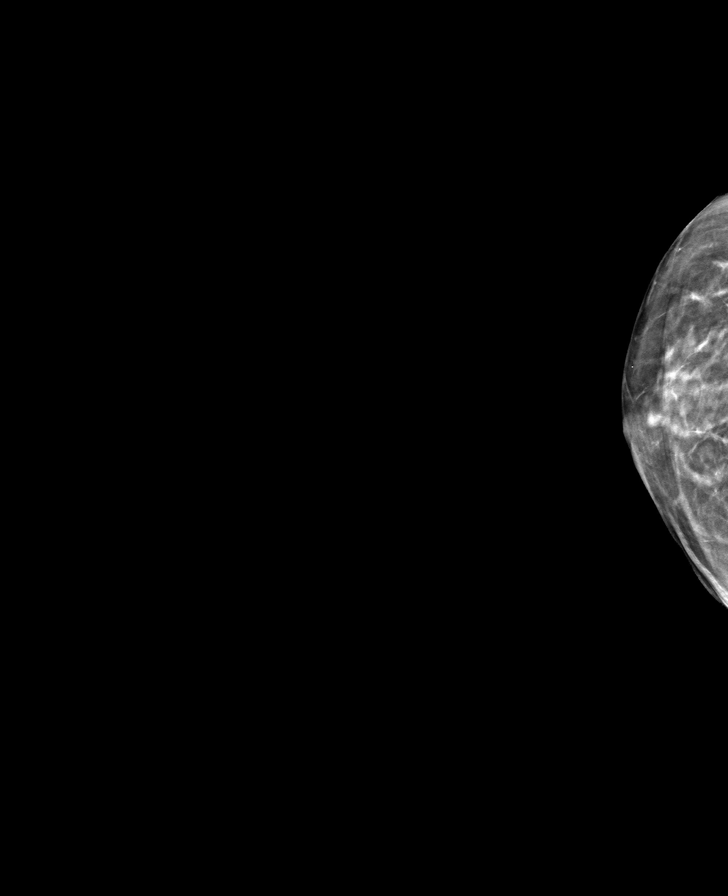

[R MLO tomo (2 of 2) · tomo slice 25/48.0]
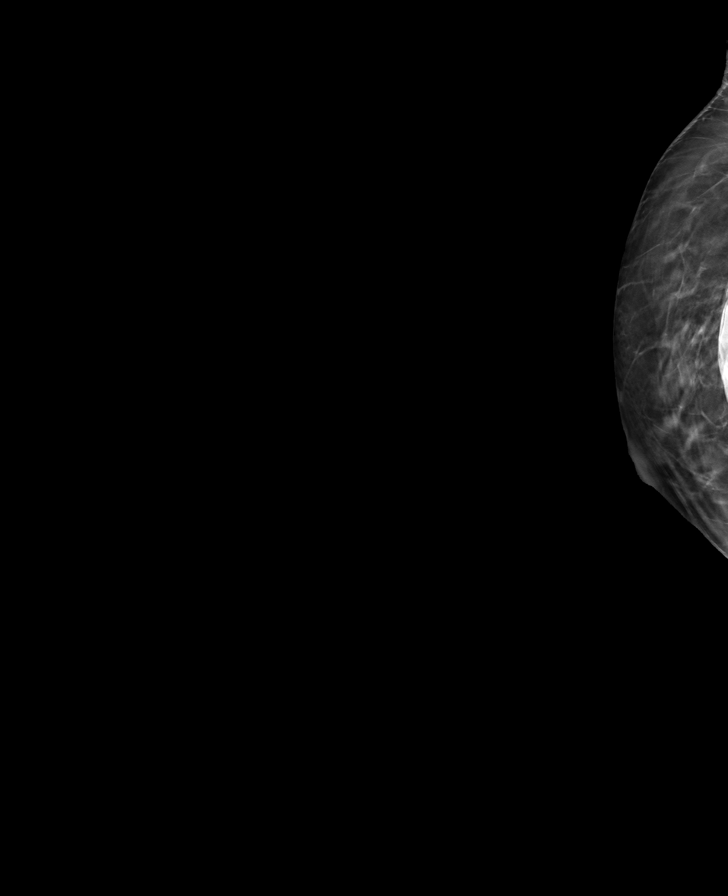

[8 of 20 positions shown; findings below may reference images not displayed]

ACR Breast Density Category c: The breast tissue is heterogeneously
dense, which may obscure small masses.
FINDINGS: No focal or suspicious mammographic findings are identified within
the right breast. The parenchymal pattern is stable. The patient has
retropectoral implants.
IMPRESSION: No mammographic evidence of malignancy on the right.

RECOMMENDATION:
1. Clinical follow-up recommended for the previously palpable area
of concern in the right breast. Any further workup should be based
on clinical grounds.
2. Routine annual screening mammography due in September 2021.

I have discussed the findings and recommendations with the patient.
If applicable, a reminder letter will be sent to the patient
regarding the next appointment.

BI-RADS CATEGORY  1: Negative.

## 2022-07-11 DIAGNOSIS — R2681 Unsteadiness on feet: Secondary | ICD-10-CM | POA: Diagnosis not present

## 2022-07-11 DIAGNOSIS — R2689 Other abnormalities of gait and mobility: Secondary | ICD-10-CM | POA: Diagnosis not present

## 2022-07-11 DIAGNOSIS — S72044S Nondisplaced fracture of base of neck of right femur, sequela: Secondary | ICD-10-CM | POA: Diagnosis not present

## 2022-07-11 DIAGNOSIS — M6281 Muscle weakness (generalized): Secondary | ICD-10-CM | POA: Diagnosis not present

## 2022-07-11 DIAGNOSIS — M25551 Pain in right hip: Secondary | ICD-10-CM | POA: Diagnosis not present

## 2022-07-11 DIAGNOSIS — T8484XD Pain due to internal orthopedic prosthetic devices, implants and grafts, subsequent encounter: Secondary | ICD-10-CM | POA: Diagnosis not present

## 2022-07-11 DIAGNOSIS — M7061 Trochanteric bursitis, right hip: Secondary | ICD-10-CM | POA: Diagnosis not present

## 2022-07-27 DIAGNOSIS — R600 Localized edema: Secondary | ICD-10-CM | POA: Diagnosis not present

## 2022-07-27 DIAGNOSIS — R2689 Other abnormalities of gait and mobility: Secondary | ICD-10-CM | POA: Diagnosis not present

## 2022-07-27 DIAGNOSIS — G609 Hereditary and idiopathic neuropathy, unspecified: Secondary | ICD-10-CM | POA: Diagnosis not present

## 2022-07-27 DIAGNOSIS — M539 Dorsopathy, unspecified: Secondary | ICD-10-CM | POA: Diagnosis not present

## 2022-07-27 DIAGNOSIS — Z8669 Personal history of other diseases of the nervous system and sense organs: Secondary | ICD-10-CM | POA: Diagnosis not present

## 2022-07-27 DIAGNOSIS — G959 Disease of spinal cord, unspecified: Secondary | ICD-10-CM | POA: Diagnosis not present

## 2022-08-08 DIAGNOSIS — R2681 Unsteadiness on feet: Secondary | ICD-10-CM | POA: Diagnosis not present

## 2022-08-08 DIAGNOSIS — M7061 Trochanteric bursitis, right hip: Secondary | ICD-10-CM | POA: Diagnosis not present

## 2022-08-08 DIAGNOSIS — T8484XD Pain due to internal orthopedic prosthetic devices, implants and grafts, subsequent encounter: Secondary | ICD-10-CM | POA: Diagnosis not present

## 2022-08-08 DIAGNOSIS — R2689 Other abnormalities of gait and mobility: Secondary | ICD-10-CM | POA: Diagnosis not present

## 2022-08-08 DIAGNOSIS — M6281 Muscle weakness (generalized): Secondary | ICD-10-CM | POA: Diagnosis not present

## 2022-08-08 DIAGNOSIS — S72044S Nondisplaced fracture of base of neck of right femur, sequela: Secondary | ICD-10-CM | POA: Diagnosis not present

## 2022-08-08 DIAGNOSIS — M25551 Pain in right hip: Secondary | ICD-10-CM | POA: Diagnosis not present

## 2022-09-12 DIAGNOSIS — R2681 Unsteadiness on feet: Secondary | ICD-10-CM | POA: Diagnosis not present

## 2022-09-12 DIAGNOSIS — M6281 Muscle weakness (generalized): Secondary | ICD-10-CM | POA: Diagnosis not present

## 2022-09-12 DIAGNOSIS — T8484XD Pain due to internal orthopedic prosthetic devices, implants and grafts, subsequent encounter: Secondary | ICD-10-CM | POA: Diagnosis not present

## 2022-09-12 DIAGNOSIS — M7061 Trochanteric bursitis, right hip: Secondary | ICD-10-CM | POA: Diagnosis not present

## 2022-09-12 DIAGNOSIS — M25551 Pain in right hip: Secondary | ICD-10-CM | POA: Diagnosis not present

## 2022-09-12 DIAGNOSIS — R2689 Other abnormalities of gait and mobility: Secondary | ICD-10-CM | POA: Diagnosis not present

## 2022-09-12 DIAGNOSIS — S72044S Nondisplaced fracture of base of neck of right femur, sequela: Secondary | ICD-10-CM | POA: Diagnosis not present

## 2022-10-10 DIAGNOSIS — M6281 Muscle weakness (generalized): Secondary | ICD-10-CM | POA: Diagnosis not present

## 2022-10-10 DIAGNOSIS — M25551 Pain in right hip: Secondary | ICD-10-CM | POA: Diagnosis not present

## 2022-10-10 DIAGNOSIS — R2689 Other abnormalities of gait and mobility: Secondary | ICD-10-CM | POA: Diagnosis not present

## 2022-10-10 DIAGNOSIS — R2681 Unsteadiness on feet: Secondary | ICD-10-CM | POA: Diagnosis not present

## 2022-10-10 DIAGNOSIS — M7061 Trochanteric bursitis, right hip: Secondary | ICD-10-CM | POA: Diagnosis not present

## 2022-10-10 DIAGNOSIS — T8484XD Pain due to internal orthopedic prosthetic devices, implants and grafts, subsequent encounter: Secondary | ICD-10-CM | POA: Diagnosis not present

## 2022-10-10 DIAGNOSIS — S72044S Nondisplaced fracture of base of neck of right femur, sequela: Secondary | ICD-10-CM | POA: Diagnosis not present

## 2022-10-18 DIAGNOSIS — D225 Melanocytic nevi of trunk: Secondary | ICD-10-CM | POA: Diagnosis not present

## 2022-10-18 DIAGNOSIS — D2261 Melanocytic nevi of right upper limb, including shoulder: Secondary | ICD-10-CM | POA: Diagnosis not present

## 2022-10-18 DIAGNOSIS — R208 Other disturbances of skin sensation: Secondary | ICD-10-CM | POA: Diagnosis not present

## 2022-10-18 DIAGNOSIS — B078 Other viral warts: Secondary | ICD-10-CM | POA: Diagnosis not present

## 2022-10-18 DIAGNOSIS — D2272 Melanocytic nevi of left lower limb, including hip: Secondary | ICD-10-CM | POA: Diagnosis not present

## 2022-10-18 DIAGNOSIS — D2262 Melanocytic nevi of left upper limb, including shoulder: Secondary | ICD-10-CM | POA: Diagnosis not present

## 2022-10-18 DIAGNOSIS — Z85828 Personal history of other malignant neoplasm of skin: Secondary | ICD-10-CM | POA: Diagnosis not present

## 2022-10-18 DIAGNOSIS — R238 Other skin changes: Secondary | ICD-10-CM | POA: Diagnosis not present

## 2022-11-26 DIAGNOSIS — H35363 Drusen (degenerative) of macula, bilateral: Secondary | ICD-10-CM | POA: Diagnosis not present

## 2022-11-26 DIAGNOSIS — Z961 Presence of intraocular lens: Secondary | ICD-10-CM | POA: Diagnosis not present

## 2022-12-11 DIAGNOSIS — R7303 Prediabetes: Secondary | ICD-10-CM | POA: Diagnosis not present

## 2022-12-11 DIAGNOSIS — I10 Essential (primary) hypertension: Secondary | ICD-10-CM | POA: Diagnosis not present

## 2022-12-12 ENCOUNTER — Other Ambulatory Visit: Payer: Self-pay | Admitting: Cardiovascular Disease

## 2022-12-18 DIAGNOSIS — R7303 Prediabetes: Secondary | ICD-10-CM | POA: Diagnosis not present

## 2022-12-18 DIAGNOSIS — I10 Essential (primary) hypertension: Secondary | ICD-10-CM | POA: Diagnosis not present

## 2022-12-18 DIAGNOSIS — Z9989 Dependence on other enabling machines and devices: Secondary | ICD-10-CM | POA: Diagnosis not present

## 2022-12-18 DIAGNOSIS — Z0000 Encounter for general adult medical examination without abnormal findings: Secondary | ICD-10-CM | POA: Diagnosis not present

## 2022-12-18 DIAGNOSIS — F411 Generalized anxiety disorder: Secondary | ICD-10-CM | POA: Diagnosis not present

## 2022-12-18 DIAGNOSIS — M5416 Radiculopathy, lumbar region: Secondary | ICD-10-CM | POA: Diagnosis not present

## 2023-02-05 ENCOUNTER — Other Ambulatory Visit: Payer: Self-pay | Admitting: Internal Medicine

## 2023-02-05 DIAGNOSIS — Z1231 Encounter for screening mammogram for malignant neoplasm of breast: Secondary | ICD-10-CM

## 2023-02-26 DIAGNOSIS — R2689 Other abnormalities of gait and mobility: Secondary | ICD-10-CM | POA: Diagnosis not present

## 2023-02-26 DIAGNOSIS — G609 Hereditary and idiopathic neuropathy, unspecified: Secondary | ICD-10-CM | POA: Diagnosis not present

## 2023-02-26 DIAGNOSIS — M539 Dorsopathy, unspecified: Secondary | ICD-10-CM | POA: Diagnosis not present

## 2023-02-26 DIAGNOSIS — G959 Disease of spinal cord, unspecified: Secondary | ICD-10-CM | POA: Diagnosis not present

## 2023-03-13 ENCOUNTER — Other Ambulatory Visit: Payer: Self-pay | Admitting: Cardiovascular Disease

## 2023-03-25 ENCOUNTER — Other Ambulatory Visit: Payer: Self-pay | Admitting: Internal Medicine

## 2023-03-25 ENCOUNTER — Ambulatory Visit
Admission: RE | Admit: 2023-03-25 | Discharge: 2023-03-25 | Disposition: A | Discharge status: Home / Self Care | Payer: PPO | Source: Ambulatory Visit | Attending: Internal Medicine | Admitting: Internal Medicine

## 2023-03-25 DIAGNOSIS — Z1231 Encounter for screening mammogram for malignant neoplasm of breast: Secondary | ICD-10-CM | POA: Insufficient documentation

## 2023-04-18 DIAGNOSIS — J45901 Unspecified asthma with (acute) exacerbation: Secondary | ICD-10-CM | POA: Diagnosis not present

## 2023-04-18 DIAGNOSIS — I771 Stricture of artery: Secondary | ICD-10-CM | POA: Diagnosis not present

## 2023-04-25 DIAGNOSIS — L814 Other melanin hyperpigmentation: Secondary | ICD-10-CM | POA: Diagnosis not present

## 2023-04-25 DIAGNOSIS — L821 Other seborrheic keratosis: Secondary | ICD-10-CM | POA: Diagnosis not present

## 2023-04-25 DIAGNOSIS — L570 Actinic keratosis: Secondary | ICD-10-CM | POA: Diagnosis not present

## 2023-04-25 DIAGNOSIS — L820 Inflamed seborrheic keratosis: Secondary | ICD-10-CM | POA: Diagnosis not present

## 2023-04-29 NOTE — Progress Notes (Deleted)
Date:  04/29/2023   ID:  [name] Woods Jul 16, 1940, MRN [id number]  Patient Location:  [address] [place] [address]   Provider location:   [facility], [place] office  PCP:  [name] Regulus, MD  Cardiologist:  [name] Woods   No chief complaint on file.    History of Present Illness:    [name] Woods is a 82 y.o. female  past medical history of Labile Hypertension, causing significant anxiety in the past  Anxiety Guillain-Barre syndrome  Chest pain Previously seen by [facility] cardiology in 2017 Negative stress test 2016 She presents for follow up of her labile hypertension  Last seen by myself in clinic February 2024   Last seen by myself in clinic June 06, 2021 Seen by one of our providers July 2023 In follow-up today reports she has been doing relatively well, trying to stay active, does swimming once a week, recumbent bike Still with some gait instability, presents in wheelchair today  In general has not been checking her blood pressure Yesterday late afternoon, BP elevated 158 High this Am well Continues to take carvedilol 12.5 twice daily with losartan 20 5 in the evening  Continues on torsemide 10 every morning, denies significant leg swelling  Prior CT scan knee  Advanced 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  Surgery 3/23 knee surgery  Continues to live at [facility] work reviewed Normal BMP Total cholesterol 246 LDL 153 Started on Zetia total cholesterol down to 200  EKG personally reviewed by myself on todays visit Normal sinus rhythm rate 65 bpm no significant ST-T wave changes  Other past medical history reviewed CT chest 2018 with mild aortic atherosclerosis in the aorta, predominantly in the arch Images pulled up and reviewed, mild aortic atherosclerosis noted  amlodipine held for leg  swelling Not checking pressure at home  Previous workup includes Stress echocardiogram 10/28/2014 with normal LV function, no ischemia   Other past medical history includes Spinal stenosis, back pain, Cervical surgery Trouble walking Legs heavy   Mom with dementia, Father with FTT   Past Medical History:  Diagnosis Date   Anxiety    Arthritis    Cancer (HCC)    SKIN CA   Chest pain    a. 10/2014 St Echo: nl LV fxn, no wma/ischemia.   GERD (gastroesophageal reflux disease)    Guillain Barr syndrome (HCC) 1980   Hypertension    PONV (postoperative nausea and vomiting)    H/O NAUSEA   Spinal stenosis    Past Surgical History:  Procedure Laterality Date   ABDOMINAL HYSTERECTOMY     AUGMENTATION MAMMAPLASTY  2006   BACK SURGERY     CERVICAL SPINE SURGERY N/A 2016   for myelopathy   COLONOSCOPY     HIP PINNING,CANNULATED Right 11/11/2019   Procedure: CANNULATED HIP PINNING;  Surgeon: [name] Bucker, MD;  Location: [facility];  Service: Orthopedics;  Laterality: Right;   LUMBAR DISC SURGERY     SACROPLASTY Bilateral 04/26/2020   Procedure: SACROPLASTY;  Surgeon: [name] Bucker, MD;  Location: [facility];  Service: Orthopedics;  Laterality: Bilateral;   TOTAL KNEE ARTHROPLASTY Right 11/08/2020   Procedure: TOTAL KNEE ARTHROPLASTY-RNFA;  Surgeon: [name] Bucker, MD;  Location: [facility];  Service: Orthopedics;  Laterality: Right;   TOTAL KNEE ARTHROPLASTY Left 08/01/2021   Procedure: TOTAL KNEE ARTHROPLASTY;  Surgeon: [name] Bucker, MD;  Location: [facility];  Service: Orthopedics;  Laterality: Left;     No outpatient medications have been marked as taking for the 05/03/23 encounter (Appointment) with [name] Iba, MD.     Allergies:   Amlodipine, Azithromycin, and Prednisone   Social History   Tobacco Use   Smoking status: Never   Smokeless tobacco: Never  Vaping Use   Vaping status: Never Used  Substance Use Topics   Alcohol use: Yes    Alcohol/week: 1.0 standard  drink of alcohol    Types: 1 Glasses of wine per week    Comment: occasionally   Drug use: No      Family Hx: The patient's family history includes Hypertension in her father and mother. There is no history of Breast cancer.  ROS:   Please see the history of present illness.    Review of Systems  Constitutional: Negative.   HENT: Negative.    Respiratory: Negative.    Cardiovascular: Negative.   Gastrointestinal: Negative.   Musculoskeletal: Negative.   Neurological: Negative.   Psychiatric/Behavioral: Negative.    All other systems reviewed and are negative.    Labs/Other Tests and Data Reviewed:    Recent Labs: No results found for requested labs within last 365 days.   Recent Lipid Panel No results found for: "CHOL", "TRIG", "HDL", "CHOLHDL", "LDLCALC", "LDLDIRECT"  Wt Readings from Last 3 Encounters:  06/11/22 141 lb 8 oz (64.2 kg)  11/23/21 135 lb (61.2 kg)  08/01/21 136 lb (61.7 kg)     Exam:    Vital Signs: Vital signs may also be detailed in the HPI There were no vitals taken for this visit.  Constitutional:  oriented to person, place, and time. No distress.  In a wheelchair HENT:  Head: Grossly normal Eyes:  no discharge. No scleral icterus.  Neck: No JVD, no carotid bruits  Cardiovascular: Regular rate and rhythm, 1-2/6 systolic ejection murmur right sternal border Pulmonary/Chest: Clear to auscultation bilaterally, no wheezes or rails Abdominal: Soft.  no distension.  no tenderness.  Musculoskeletal: Normal range of motion Neurological:  normal muscle tone. Coordination normal. No atrophy Skin: Skin warm and dry Psychiatric: normal affect, pleasant   ASSESSMENT & PLAN:    Labile hypertension taking Coreg 12.5 twice daily losartan25  Recommend she continue to monitor blood pressure, take losartan 25 twice daily for systolic pressure greater than 140s She is off hydralazine, continue torsemide  Leg edema Avoid calcium channel  blockers Torsemide low dose daily, stable BMP No significant edema on today's visit  Anxiety Periods of anxiety, managed by primary care Sometimes when anxious blood pressure will run high  Atypical chest pain No further episodes  Hyperlipidemia Tolerating zetia 10 mg daily, total cholesterol down 50 points  Aortic atherosclerosis Mild, predominantly in the arch, picture shown to her,  Continue Zetia, previously declined statin   Total encounter time more than 30 minutes  Greater than 50% was spent in counseling and coordination of care with the patient   Disposition: Follow-up in 12 months   Signed, Julien Nordmann, MD  04/29/2023 8:14 AM    [facility] [address], [place], [place] [address]

## 2023-05-03 ENCOUNTER — Ambulatory Visit: Payer: PPO | Admitting: Cardiovascular Disease

## 2023-05-03 DIAGNOSIS — R600 Localized edema: Secondary | ICD-10-CM

## 2023-05-03 DIAGNOSIS — R0602 Shortness of breath: Secondary | ICD-10-CM

## 2023-05-03 DIAGNOSIS — R0989 Other specified symptoms and signs involving the circulatory and respiratory systems: Secondary | ICD-10-CM

## 2023-05-03 DIAGNOSIS — R0789 Other chest pain: Secondary | ICD-10-CM

## 2023-05-03 DIAGNOSIS — I700 Atherosclerosis of aorta: Secondary | ICD-10-CM

## 2023-05-03 DIAGNOSIS — E782 Mixed hyperlipidemia: Secondary | ICD-10-CM

## 2023-05-14 ENCOUNTER — Other Ambulatory Visit: Payer: Self-pay | Admitting: Cardiovascular Disease

## 2023-05-17 ENCOUNTER — Telehealth: Payer: Self-pay | Admitting: Cardiovascular Disease

## 2023-05-17 NOTE — Telephone Encounter (Signed)
 Called and spoke with patient. Patient states that her blood pressure has been elevated. She reports that blood pressure has been running 180's/90's. Denies any symptoms. Patient states that she has taken her prn Coreg  about an hour prior. Reports current blood pressure 169/80. Patient asking when to take PRN Coreg  and Losartan . Patient given instructions. Patient verbalizes understanding and appreciation. Patient has an appointment on 05/22/23.

## 2023-05-17 NOTE — Telephone Encounter (Signed)
 Pt c/o BP issue: STAT if pt c/o blurred vision, one-sided weakness or slurred speech  1. What are your last 5 BP readings?  181/95 HR 67 113/63 183/94 HR 78 184/99 HR 77 161/84 HR 62  2. Are you having any other symptoms (ex. Dizziness, headache, blurred vision, passed out)? Slight dizziness when BP went to 113/63 this morning   3. What is your BP issue? Patient is requesting call back to discuss BP readings and medication management.

## 2023-05-20 NOTE — Telephone Encounter (Signed)
 Called and spoke with patient. Patient states that she took her morning blood pressure medications at 7:30 am. Patient states that 8:00 her blood pressure was 170/83. Patient encouraged to check blood pressure 2 hours after taking medication. Patient reports that she took her prn Carvedilol  at 10:00 and current blood pressure is 152/84. Patient encouraged to keep a log of blood pressures and heart rates for upcoming appointment. Patient verbalizes understanding.

## 2023-05-20 NOTE — Telephone Encounter (Signed)
 Pt calling back to report bp after taking carvedilol and losartan as instructed over the weekend and bp this morning @ 8am was 170/83 hr 73. She is requesting cb to discuss.

## 2023-05-22 ENCOUNTER — Ambulatory Visit: Payer: PPO | Attending: Nurse Practitioner | Admitting: Nurse Practitioner

## 2023-05-22 ENCOUNTER — Encounter: Payer: Self-pay | Admitting: Nurse Practitioner

## 2023-05-22 VITALS — BP 149/68 | HR 70 | Ht 62.0 in | Wt 144.0 lb

## 2023-05-22 DIAGNOSIS — E782 Mixed hyperlipidemia: Secondary | ICD-10-CM | POA: Diagnosis not present

## 2023-05-22 DIAGNOSIS — R0989 Other specified symptoms and signs involving the circulatory and respiratory systems: Secondary | ICD-10-CM | POA: Diagnosis not present

## 2023-05-22 DIAGNOSIS — R0789 Other chest pain: Secondary | ICD-10-CM

## 2023-05-22 MED ORDER — CARVEDILOL 12.5 MG PO TABS: 12.5000 mg | ORAL_TABLET | Freq: Two times a day (BID) | ORAL | 1 refills | Status: DC

## 2023-05-22 MED ORDER — CARVEDILOL 6.25 MG PO TABS: 6.2500 mg | ORAL_TABLET | Freq: Two times a day (BID) | ORAL | 1 refills | Status: DC

## 2023-05-22 MED ORDER — LOSARTAN POTASSIUM 25 MG PO TABS: 25.0000 mg | ORAL_TABLET | Freq: Every evening | ORAL | 1 refills | Status: DC

## 2023-05-22 MED ORDER — CARVEDILOL 6.25 MG PO TABS: 6.2500 mg | ORAL_TABLET | Freq: Two times a day (BID) | ORAL | Status: DC

## 2023-05-22 NOTE — Progress Notes (Signed)
 Office Visit    Patient Name: [name] Woods Date of Encounter: 05/22/2023  Primary Care Provider:  Jimmy Moulding, MD Primary Cardiologist:  Belva Boyden, MD  Chief Complaint    83 y.o. female  with a history of labile hypertension, Lattie Poli, chest pain, and spinal stenosis, who presents for follow-up related to hypertension.   Past Medical History  Subjective   Past Medical History:  Diagnosis Date   Anxiety    Arthritis    Cancer (HCC)    SKIN CA   Chest pain    a. 10/2014 St Echo: nl LV fxn, no wma/ischemia.   GERD (gastroesophageal reflux disease)    Guillain Barr syndrome (HCC) 1980   Hypertension    PONV (postoperative nausea and vomiting)    H/O NAUSEA   Spinal stenosis    Past Surgical History:  Procedure Laterality Date   ABDOMINAL HYSTERECTOMY     AUGMENTATION MAMMAPLASTY  2006   BACK SURGERY     CERVICAL SPINE SURGERY N/A 2016   for myelopathy   COLONOSCOPY     HIP PINNING,CANNULATED Right 11/11/2019   Procedure: CANNULATED HIP PINNING;  Surgeon: Molli Angelucci, MD;  Location: [facility];  Service: Orthopedics;  Laterality: Right;   LUMBAR DISC SURGERY     SACROPLASTY Bilateral 04/26/2020   Procedure: SACROPLASTY;  Surgeon: Molli Angelucci, MD;  Location: [facility];  Service: Orthopedics;  Laterality: Bilateral;   TOTAL KNEE ARTHROPLASTY Right 11/08/2020   Procedure: TOTAL KNEE ARTHROPLASTY-RNFA;  Surgeon: Molli Angelucci, MD;  Location: [facility];  Service: Orthopedics;  Laterality: Right;   TOTAL KNEE ARTHROPLASTY Left 08/01/2021   Procedure: TOTAL KNEE ARTHROPLASTY;  Surgeon: Molli Angelucci, MD;  Location: [facility];  Service: Orthopedics;  Laterality: Left;    Allergies  Allergies  Allergen Reactions   Amlodipine  Swelling   Azithromycin Hives and Rash   Prednisone Nausea And Vomiting and Other (See Comments)      History of Present Illness      83 y.o. y/o female with above past medical history including hypertension, chest  pain with prior negative stress echo in 2016, anxiety, and Guillain-Barr. She has a long history of labile hypertension which has generated anxiety in the past. She has had multiple medication adjustments over the years and was last seen in cardiology clinic in January 2023 at which time she was maintained on carvedilol  12.5 mg twice daily, losartan  25 mg daily, and torsemide  5 mg daily. She was advised that she can take an additional losartan  for elevated blood pressures. She was previously on hydralazine  though this was discontinued sometime in 2021.     She was last seen in clinic in 06/2022, at which time, she was doing well on no medications were changed.  She has generally done well over the past year w/o chest pain or dyspnea.  Over the past week, her BP has been trending higher in the 150's to 180's.  When this occurs, she takes an additional 12.5 mg of carvedilol  or will take an additional 25 mg of losartan , and on some days, she does both.  Pressure today is 149/68.  She denies palpitations, PND, orthopnea, dizziness, syncope, edema, or early satiety. Objective  Home Medications    Current Outpatient Medications  Medication Sig Dispense Refill   cyanocobalamin  2000 MCG tablet Take 2,000 mcg by mouth daily.     diphenhydramine -acetaminophen  (TYLENOL  PM) 25-500 MG TABS tablet Take 1 tablet by mouth at bedtime as needed (sleep).  escitalopram  (LEXAPRO ) 10 MG tablet Take 10 mg by mouth every morning.     estradiol  (ESTRACE ) 0.5 MG tablet Take 0.5 mg by mouth in the morning.     ezetimibe  (ZETIA ) 10 MG tablet TAKE 1 TABLET BY MOUTH DAILY 90 tablet 3   gabapentin  (NEURONTIN ) 300 MG capsule Take 300 mg by mouth at bedtime.     nitroGLYCERIN  (NITROSTAT ) 0.4 MG SL tablet Place 1 tablet (0.4 mg total) under the tongue every 5 (five) minutes as needed for chest pain. 25 tablet 3   omeprazole (PRILOSEC) 20 MG capsule Take 20 mg by mouth in the morning.     tiZANidine  (ZANAFLEX ) 2 MG tablet Take 2  mg by mouth every evening.     torsemide  (DEMADEX ) 10 MG tablet Take 10 mg by mouth in the morning.     carvedilol  (COREG ) 12.5 MG tablet Take 1 tablet (12.5 mg total) by mouth 2 (two) times daily with a meal. 180 tablet 1   carvedilol  (COREG ) 6.25 MG tablet Take 1 tablet (6.25 mg total) by mouth 2 (two) times daily.     losartan  (COZAAR ) 25 MG tablet Take 1 tablet (25 mg total) by mouth every evening. 90 tablet 1   Magnesium  500 MG TABS Take 500 mg by mouth daily.     Menthol , Topical Analgesic, (BIOFREEZE EX) Apply 1 application. topically daily as needed (knee pain).     No current facility-administered medications for this visit.     Physical Exam    VS:  BP (!) 149/68   Pulse 70   Ht 5\' 2"  (1.575 m)   Wt 144 lb (65.3 kg)   SpO2 95%   BMI 26.34 kg/m  , BMI Body mass index is 26.34 kg/m.       GEN: Well nourished, well developed, in no acute distress. HEENT: normal. Neck: Supple, no JVD, carotid bruits, or masses. Cardiac: RRR, no murmurs, rubs, or gallops. No clubbing, cyanosis, edema.  Radials 2+/PT 2+ and equal bilaterally.  Respiratory:  Respirations regular and unlabored, clear to auscultation bilaterally. GI: Soft, nontender, nondistended, BS + x 4. MS: no deformity or atrophy. Skin: warm and dry, no rash. Neuro:  Strength and sensation are intact. Psych: Normal affect.  Accessory Clinical Findings    ECG personally reviewed by me today - EKG Interpretation Date/Time:  Wednesday May 22 2023 14:46:23 EST Ventricular Rate:  70 PR Interval:  150 QRS Duration:  80 QT Interval:  394 QTC Calculation: 425 R Axis:   -6  Text Interpretation: Normal sinus rhythm Normal ECG Nonspecific T wave abnormality now evident in Inferior leads Confirmed by Laneta Pintos [id number]) on 05/22/2023 3:00:39 PM  - no acute changes.  Lab Results  Component Value Date   WBC 11.4 (H) 08/03/2021   HGB 9.5 (L) 08/03/2021   HCT 28.4 (L) 08/03/2021   MCV 91.0 08/03/2021   PLT 217  08/03/2021   Lab Results  Component Value Date   CREATININE 0.98 08/03/2021   BUN 22 08/03/2021   NA 127 (L) 08/03/2021   K 4.3 08/03/2021   CL 92 (L) 08/03/2021   CO2 31 08/03/2021   Lab Results  Component Value Date   ALT 18 07/21/2021   AST 19 07/21/2021   ALKPHOS 75 07/21/2021   BILITOT 0.5 07/21/2021      Assessment & Plan    1.  Labile hypertension: Patient is generally done well over the past year but over the past week, she has had higher pressures,  sometimes into the 180s necessitating additional doses of carvedilol  and or losartan .  Home blood pressures reviewed today.  In clinic today, she is 149/68.  We agreed to increase her carvedilol  to 18.75 mg twice daily in hopes of achieving a more sustained blood pressure lowering as opposed to her frequently taking additional doses of medications.  She will trend blood pressures over the next few weeks and submit trends via MyChart in about 2 weeks.  Pending results, could consider increasing carvedilol  or losartan  further.  2.  History of chest pain: Negative stress test in 2016.  No recent chest pain or dyspnea.  She remains on beta-blocker and Zetia .  3.  Hyperlipidemia: On Zetia  with LDL of 138 in August.    4.  Disposition: Follow-up in clinic in 4 to 6 weeks to reevaluate blood pressure.  Laneta Pintos, NP 05/22/2023, 4:50 PM

## 2023-05-22 NOTE — Patient Instructions (Addendum)
 Medication Instructions:  Carvedilol : Take the 12.5 mg twice daily, in addition to the 6.25 mg twice daily  *If you need a refill on your cardiac medications before your next appointment, please call your pharmacy*   Lab Work: None ordered If you have labs (blood work) drawn today and your tests are completely normal, you will receive your results only by: MyChart Message (if you have MyChart) OR A paper copy in the mail If you have any lab test that is abnormal or we need to change your treatment, we will call you to review the results.   Testing/Procedures: None ordered   Follow-Up: At [facility], you and your health needs are our priority.  As part of our continuing mission to provide you with exceptional heart care, we have created designated Provider Care Teams.  These Care Teams include your primary Cardiologist (physician) and Advanced Practice Providers (APPs -  Physician Assistants and Nurse Practitioners) who all work together to provide you with the care you need, when you need it.  We recommend signing up for the patient portal called "MyChart".  Sign up information is provided on this After Visit Summary.  MyChart is used to connect with patients for Virtual Visits (Telemedicine).  Patients are able to view lab/test results, encounter notes, upcoming appointments, etc.  Non-urgent messages can be sent to your provider as well.   To learn more about what you can do with MyChart, go to [email or web address].    Your next appointment:   4-6 week(s)  Provider:   You may see Timothy Gollan, MD or one of the following Advanced Practice Providers on your designated Care Team:   -Odessa Bene, NP

## 2023-06-10 ENCOUNTER — Other Ambulatory Visit: Payer: Self-pay | Admitting: Cardiovascular Disease

## 2023-06-11 DIAGNOSIS — R7303 Prediabetes: Secondary | ICD-10-CM | POA: Diagnosis not present

## 2023-06-11 DIAGNOSIS — I10 Essential (primary) hypertension: Secondary | ICD-10-CM | POA: Diagnosis not present

## 2023-06-11 MED ORDER — CARVEDILOL 25 MG PO TABS: 12.5000 mg | ORAL_TABLET | Freq: Two times a day (BID) | ORAL | 3 refills | Status: DC

## 2023-06-18 DIAGNOSIS — I10 Essential (primary) hypertension: Secondary | ICD-10-CM | POA: Diagnosis not present

## 2023-06-18 DIAGNOSIS — F411 Generalized anxiety disorder: Secondary | ICD-10-CM | POA: Diagnosis not present

## 2023-06-18 DIAGNOSIS — R7303 Prediabetes: Secondary | ICD-10-CM | POA: Diagnosis not present

## 2023-06-18 DIAGNOSIS — G959 Disease of spinal cord, unspecified: Secondary | ICD-10-CM | POA: Diagnosis not present

## 2023-06-30 NOTE — Progress Notes (Unsigned)
 Date:  07/01/2023   ID:  Berneta Sages, DOB 08/01/1940, MRN [id number]  Patient Location:  [address] [place] [address]   Provider location:   [facility], [place] office  PCP:  Lauro Regulus, MD  Cardiologist:  [facility]   Chief Complaint  Patient presents with   4-6 week follow up     "Doing well."     History of Present Illness:    [name] Woods is a 83 y.o. female  past medical history of Labile Hypertension, causing significant anxiety in the past  Anxiety Guillain-Barre syndrome  Chest pain Previously seen by [facility] cardiology in 2017 Negative stress test 2016 She presents for follow up of her labile hypertension  Last seen by myself in clinic 2/24 Seen by one of our providers January 2025 Carvedilol increased up to 18.75 twice daily, then 25 mg twice a day Blood pressure has improved on current regiment Coreg 25 twice daily losartan 25  At [facility], lives independently Neuropathy, uses a walker swimming once a week, recumbent bike gait instability, presents in wheelchair today  on torsemide 10 every morning, denies significant leg swelling  Prior CT scan knee  Had both knees replaced Fall 4 years ago hip  Lab work reviewed Normal BMP Total cholesterol 205 LDL 114 A1C 5.8  Other past medical history reviewed CT chest 2018 with mild aortic atherosclerosis in the aorta, predominantly in the arch mild aortic atherosclerosis noted  amlodipine held for leg swelling Not checking pressure at home  Previous workup includes Stress echocardiogram 10/28/2014 with normal LV function, no ischemia   Other past medical history includes Spinal stenosis, back pain, Cervical surgery Trouble walking Legs heavy   Mom with dementia, Father with FTT   Past Medical History:  Diagnosis Date   Anxiety    Arthritis    Cancer (HCC)    SKIN CA   Chest pain    a. 10/2014 St Echo: nl  LV fxn, no wma/ischemia.   GERD (gastroesophageal reflux disease)    Guillain Barr syndrome (HCC) 1980   Hypertension    PONV (postoperative nausea and vomiting)    H/O NAUSEA   Spinal stenosis    Past Surgical History:  Procedure Laterality Date   ABDOMINAL HYSTERECTOMY     AUGMENTATION MAMMAPLASTY  2006   BACK SURGERY     CERVICAL SPINE SURGERY N/A 2016   for myelopathy   COLONOSCOPY     HIP PINNING,CANNULATED Right 11/11/2019   Procedure: CANNULATED HIP PINNING;  Surgeon: Kennedy Bucker, MD;  Location: [facility];  Service: Orthopedics;  Laterality: Right;   LUMBAR DISC SURGERY     SACROPLASTY Bilateral 04/26/2020   Procedure: SACROPLASTY;  Surgeon: Kennedy Bucker, MD;  Location: [facility];  Service: Orthopedics;  Laterality: Bilateral;   TOTAL KNEE ARTHROPLASTY Right 11/08/2020   Procedure: TOTAL KNEE ARTHROPLASTY-RNFA;  Surgeon: Kennedy Bucker, MD;  Location: [facility];  Service: Orthopedics;  Laterality: Right;   TOTAL KNEE ARTHROPLASTY Left 08/01/2021   Procedure: TOTAL KNEE ARTHROPLASTY;  Surgeon: Kennedy Bucker, MD;  Location: [facility];  Service: Orthopedics;  Laterality: Left;     Current Meds  Medication Sig   carvedilol (COREG) 25 MG tablet Take 25 mg by mouth 2 (two) times daily with a meal.   cyanocobalamin 2000 MCG tablet Take 2,000 mcg by mouth daily.   diphenhydramine-acetaminophen (TYLENOL PM) 25-500 MG TABS tablet Take 1 tablet by mouth at bedtime  as needed (sleep).   escitalopram (LEXAPRO) 10 MG tablet Take 10 mg by mouth every morning.   estradiol (ESTRACE) 0.5 MG tablet Take 0.5 mg by mouth in the morning.   ezetimibe (ZETIA) 10 MG tablet TAKE 1 TABLET BY MOUTH DAILY   gabapentin (NEURONTIN) 300 MG capsule Take 300 mg by mouth at bedtime.   losartan (COZAAR) 25 MG tablet Take 1 tablet (25 mg total) by mouth every evening.   nitroGLYCERIN (NITROSTAT) 0.4 MG SL tablet Place 1 tablet (0.4 mg total) under the tongue every 5 (five) minutes as needed for chest pain.    omeprazole (PRILOSEC) 20 MG capsule Take 20 mg by mouth in the morning.   tiZANidine (ZANAFLEX) 2 MG tablet Take 2 mg by mouth every evening.   torsemide (DEMADEX) 10 MG tablet Take 10 mg by mouth in the morning.    Allergies:   Amlodipine, Azithromycin, and Prednisone   Social History   Tobacco Use   Smoking status: Never   Smokeless tobacco: Never  Vaping Use   Vaping status: Never Used  Substance Use Topics   Alcohol use: Yes    Alcohol/week: 1.0 standard drink of alcohol    Types: 1 Glasses of wine per week    Comment: occasionally   Drug use: No      Family Hx: The patient's family history includes Hypertension in her father and mother. There is no history of Breast cancer.  ROS:   Please see the history of present illness.    Review of Systems  Constitutional: Negative.   HENT: Negative.    Respiratory: Negative.    Cardiovascular: Negative.   Gastrointestinal: Negative.   Musculoskeletal: Negative.   Neurological: Negative.   Psychiatric/Behavioral: Negative.    All other systems reviewed and are negative.    Labs/Other Tests and Data Reviewed:    Recent Labs: No results found for requested labs within last 365 days.   Recent Lipid Panel No results found for: "CHOL", "TRIG", "HDL", "CHOLHDL", "LDLCALC", "LDLDIRECT"  Wt Readings from Last 3 Encounters:  07/01/23 143 lb 8 oz (65.1 kg)  05/22/23 144 lb (65.3 kg)  06/11/22 141 lb 8 oz (64.2 kg)     Exam:    Vital Signs: Vital signs may also be detailed in the HPI BP (!) 100/58 (BP Location: Left Arm, Patient Position: Sitting, Cuff Size: Normal)   Pulse 68   Ht 5\' 1"  (1.549 m)   Wt 143 lb 8 oz (65.1 kg)   SpO2 93%   BMI 27.11 kg/m   Constitutional:  oriented to person, place, and time. No distress.  HENT:  Head: Grossly normal Eyes:  no discharge. No scleral icterus.  Neck: No JVD, no carotid bruits  Cardiovascular: Regular rate and rhythm, no murmurs appreciated Pulmonary/Chest: Clear to  auscultation bilaterally, no wheezes or rails Abdominal: Soft.  no distension.  no tenderness.  Musculoskeletal: Normal range of motion Neurological:  normal muscle tone. Coordination normal. No atrophy Skin: Skin warm and dry Psychiatric: normal affect, pleasant  ASSESSMENT & PLAN:    Labile hypertension taking Coreg 25 twice daily losartan25  Blood pressure relatively well-controlled Off hydralazine Continue to take torsemide 10 mg daily  Leg edema Avoid calcium channel blockers Torsemide low dose daily, stable BMP No leg swelling Recommend she consider decreasing torsemide down to every other day in the summertime  Anxiety Periods of anxiety, managed by primary care Reports things are well-controlled  Atypical chest pain No further episodes  Hyperlipidemia Tolerating zetia  10 mg daily,  Total cholesterol down to 200  Aortic atherosclerosis Mild, predominantly in the arch, images previously reviewed Continue Zetia, previously declined statin Total cholesterol 200 Could consider PCSK9 if she is interested  Signed, Julien Nordmann, MD  07/01/2023 11:11 AM    [facility] [address], [place], [place] [address]

## 2023-07-01 ENCOUNTER — Ambulatory Visit: Payer: PPO | Attending: Cardiovascular Disease | Admitting: Cardiovascular Disease

## 2023-07-01 ENCOUNTER — Encounter: Payer: Self-pay | Admitting: Cardiovascular Disease

## 2023-07-01 VITALS — BP 100/58 | HR 68 | Ht 61.0 in | Wt 143.5 lb

## 2023-07-01 DIAGNOSIS — R600 Localized edema: Secondary | ICD-10-CM | POA: Diagnosis not present

## 2023-07-01 DIAGNOSIS — R0789 Other chest pain: Secondary | ICD-10-CM

## 2023-07-01 DIAGNOSIS — I700 Atherosclerosis of aorta: Secondary | ICD-10-CM

## 2023-07-01 DIAGNOSIS — R0602 Shortness of breath: Secondary | ICD-10-CM | POA: Diagnosis not present

## 2023-07-01 DIAGNOSIS — E782 Mixed hyperlipidemia: Secondary | ICD-10-CM | POA: Diagnosis not present

## 2023-07-01 DIAGNOSIS — R0989 Other specified symptoms and signs involving the circulatory and respiratory systems: Secondary | ICD-10-CM | POA: Diagnosis not present

## 2023-07-01 NOTE — Patient Instructions (Signed)

## 2023-07-10 ENCOUNTER — Telehealth: Payer: Self-pay | Admitting: Cardiovascular Disease

## 2023-07-10 MED ORDER — CARVEDILOL 25 MG PO TABS: 25.0000 mg | ORAL_TABLET | Freq: Two times a day (BID) | ORAL | 3 refills | Status: AC

## 2023-07-10 NOTE — Telephone Encounter (Signed)
 Requested Prescriptions   Signed Prescriptions Disp Refills   carvedilol (COREG) 25 MG tablet 180 tablet 3    Sig: Take 1 tablet (25 mg total) by mouth 2 (two) times daily with a meal.    Authorizing Provider: Antonieta Iba    Ordering User: Kendrick Fries

## 2023-07-10 NOTE — Telephone Encounter (Signed)
*  STAT* If patient is at the pharmacy, call can be transferred to refill team.   1. Which medications need to be refilled? (please list name of each medication and dose if known)   carvedilol (COREG) 25 MG tablet      4. Which pharmacy/location (including street and city if local pharmacy) is medication to be sent to? [facility] - [place], [place] - [address]     5. Do they need a 30 day or 90 day supply? 90

## 2023-07-12 DIAGNOSIS — R300 Dysuria: Secondary | ICD-10-CM | POA: Diagnosis not present

## 2023-07-12 DIAGNOSIS — R8290 Unspecified abnormal findings in urine: Secondary | ICD-10-CM | POA: Diagnosis not present

## 2023-07-12 DIAGNOSIS — R350 Frequency of micturition: Secondary | ICD-10-CM | POA: Diagnosis not present

## 2023-08-07 DIAGNOSIS — M6281 Muscle weakness (generalized): Secondary | ICD-10-CM | POA: Diagnosis not present

## 2023-08-07 DIAGNOSIS — G609 Hereditary and idiopathic neuropathy, unspecified: Secondary | ICD-10-CM | POA: Diagnosis not present

## 2023-08-07 DIAGNOSIS — R2689 Other abnormalities of gait and mobility: Secondary | ICD-10-CM | POA: Diagnosis not present

## 2023-08-07 DIAGNOSIS — R278 Other lack of coordination: Secondary | ICD-10-CM | POA: Diagnosis not present

## 2023-08-29 DIAGNOSIS — R2689 Other abnormalities of gait and mobility: Secondary | ICD-10-CM | POA: Diagnosis not present

## 2023-08-29 DIAGNOSIS — G959 Disease of spinal cord, unspecified: Secondary | ICD-10-CM | POA: Diagnosis not present

## 2023-08-29 DIAGNOSIS — E538 Deficiency of other specified B group vitamins: Secondary | ICD-10-CM | POA: Diagnosis not present

## 2023-08-29 DIAGNOSIS — G629 Polyneuropathy, unspecified: Secondary | ICD-10-CM | POA: Diagnosis not present

## 2023-09-13 DIAGNOSIS — R2689 Other abnormalities of gait and mobility: Secondary | ICD-10-CM | POA: Diagnosis not present

## 2023-09-13 DIAGNOSIS — M6281 Muscle weakness (generalized): Secondary | ICD-10-CM | POA: Diagnosis not present

## 2023-09-13 DIAGNOSIS — G609 Hereditary and idiopathic neuropathy, unspecified: Secondary | ICD-10-CM | POA: Diagnosis not present

## 2023-09-13 DIAGNOSIS — R278 Other lack of coordination: Secondary | ICD-10-CM | POA: Diagnosis not present

## 2023-10-10 DIAGNOSIS — R2689 Other abnormalities of gait and mobility: Secondary | ICD-10-CM | POA: Diagnosis not present

## 2023-10-10 DIAGNOSIS — G609 Hereditary and idiopathic neuropathy, unspecified: Secondary | ICD-10-CM | POA: Diagnosis not present

## 2023-10-10 DIAGNOSIS — M6281 Muscle weakness (generalized): Secondary | ICD-10-CM | POA: Diagnosis not present

## 2023-10-10 DIAGNOSIS — R278 Other lack of coordination: Secondary | ICD-10-CM | POA: Diagnosis not present

## 2023-10-29 ENCOUNTER — Other Ambulatory Visit: Payer: Self-pay | Admitting: Cardiovascular Disease

## 2023-10-31 DIAGNOSIS — L820 Inflamed seborrheic keratosis: Secondary | ICD-10-CM | POA: Diagnosis not present

## 2023-10-31 DIAGNOSIS — D692 Other nonthrombocytopenic purpura: Secondary | ICD-10-CM | POA: Diagnosis not present

## 2023-10-31 DIAGNOSIS — L538 Other specified erythematous conditions: Secondary | ICD-10-CM | POA: Diagnosis not present

## 2023-10-31 DIAGNOSIS — Z85828 Personal history of other malignant neoplasm of skin: Secondary | ICD-10-CM | POA: Diagnosis not present

## 2023-10-31 DIAGNOSIS — D1801 Hemangioma of skin and subcutaneous tissue: Secondary | ICD-10-CM | POA: Diagnosis not present

## 2023-10-31 DIAGNOSIS — Z08 Encounter for follow-up examination after completed treatment for malignant neoplasm: Secondary | ICD-10-CM | POA: Diagnosis not present

## 2023-10-31 DIAGNOSIS — L570 Actinic keratosis: Secondary | ICD-10-CM | POA: Diagnosis not present

## 2023-11-07 DIAGNOSIS — R278 Other lack of coordination: Secondary | ICD-10-CM | POA: Diagnosis not present

## 2023-11-07 DIAGNOSIS — G609 Hereditary and idiopathic neuropathy, unspecified: Secondary | ICD-10-CM | POA: Diagnosis not present

## 2023-11-07 DIAGNOSIS — M6281 Muscle weakness (generalized): Secondary | ICD-10-CM | POA: Diagnosis not present

## 2023-11-07 DIAGNOSIS — R2689 Other abnormalities of gait and mobility: Secondary | ICD-10-CM | POA: Diagnosis not present

## 2023-12-10 DIAGNOSIS — G609 Hereditary and idiopathic neuropathy, unspecified: Secondary | ICD-10-CM | POA: Diagnosis not present

## 2023-12-10 DIAGNOSIS — R2689 Other abnormalities of gait and mobility: Secondary | ICD-10-CM | POA: Diagnosis not present

## 2023-12-10 DIAGNOSIS — M6281 Muscle weakness (generalized): Secondary | ICD-10-CM | POA: Diagnosis not present

## 2023-12-10 DIAGNOSIS — R278 Other lack of coordination: Secondary | ICD-10-CM | POA: Diagnosis not present

## 2023-12-12 DIAGNOSIS — G603 Idiopathic progressive neuropathy: Secondary | ICD-10-CM | POA: Diagnosis not present

## 2023-12-12 DIAGNOSIS — R7303 Prediabetes: Secondary | ICD-10-CM | POA: Diagnosis not present

## 2023-12-12 DIAGNOSIS — I10 Essential (primary) hypertension: Secondary | ICD-10-CM | POA: Diagnosis not present

## 2023-12-19 DIAGNOSIS — R7303 Prediabetes: Secondary | ICD-10-CM | POA: Diagnosis not present

## 2023-12-19 DIAGNOSIS — Z8669 Personal history of other diseases of the nervous system and sense organs: Secondary | ICD-10-CM | POA: Diagnosis not present

## 2023-12-19 DIAGNOSIS — Z0000 Encounter for general adult medical examination without abnormal findings: Secondary | ICD-10-CM | POA: Diagnosis not present

## 2023-12-19 DIAGNOSIS — Z1331 Encounter for screening for depression: Secondary | ICD-10-CM | POA: Diagnosis not present

## 2023-12-19 DIAGNOSIS — F411 Generalized anxiety disorder: Secondary | ICD-10-CM | POA: Diagnosis not present

## 2023-12-19 DIAGNOSIS — I10 Essential (primary) hypertension: Secondary | ICD-10-CM | POA: Diagnosis not present

## 2023-12-24 ENCOUNTER — Emergency Department
Admission: EM | Admit: 2023-12-24 | Discharge: 2023-12-24 | Disposition: A | Discharge status: Home / Self Care | Attending: Emergency Medicine | Admitting: Emergency Medicine

## 2023-12-24 ENCOUNTER — Emergency Department

## 2023-12-24 ENCOUNTER — Other Ambulatory Visit: Payer: Self-pay

## 2023-12-24 DIAGNOSIS — I10 Essential (primary) hypertension: Secondary | ICD-10-CM | POA: Insufficient documentation

## 2023-12-24 DIAGNOSIS — R079 Chest pain, unspecified: Secondary | ICD-10-CM | POA: Diagnosis not present

## 2023-12-24 DIAGNOSIS — E871 Hypo-osmolality and hyponatremia: Secondary | ICD-10-CM | POA: Insufficient documentation

## 2023-12-24 LAB — BASIC METABOLIC PANEL WITH GFR
Anion gap: 10 (ref 5–15)
BUN: 12 mg/dL (ref 8–23)
CO2: 26 mmol/L (ref 22–32)
Calcium: 9.4 mg/dL (ref 8.9–10.3)
Chloride: 94 mmol/L — ABNORMAL LOW (ref 98–111)
Creatinine, Ser: 0.77 mg/dL (ref 0.44–1.00)
GFR, Estimated: >60 mL/min (ref >60)
Glucose, Bld: 101 mg/dL — ABNORMAL HIGH (ref 70–99)
Potassium: 3.9 mmol/L (ref 3.5–5.1)
Sodium: 130 mmol/L — ABNORMAL LOW (ref 135–145)

## 2023-12-24 LAB — CBC
HCT: 40.6 % (ref 36.0–46.0)
Hemoglobin: 14.1 g/dL (ref 12.0–15.0)
MCH: 31.3 pg (ref 26.0–34.0)
MCHC: 34.7 g/dL (ref 30.0–36.0)
MCV: 90 fL (ref 80.0–100.0)
Platelets: 254 K/uL (ref 150–400)
RBC: 4.51 MIL/uL (ref 3.87–5.11)
RDW: 12.8 % (ref 11.5–15.5)
WBC: 9.1 K/uL (ref 4.0–10.5)
nRBC: 0 % (ref 0.0–0.2)

## 2023-12-24 LAB — TROPONIN I (HIGH SENSITIVITY)
Troponin I (High Sensitivity): 5 ng/L (ref <18)
Troponin I (High Sensitivity): 4 ng/L (ref <18)

## 2023-12-24 MED ORDER — LORAZEPAM 0.5 MG PO TABS
0.5000 mg | ORAL_TABLET | Freq: Once | ORAL | Status: AC
  Administered 2023-12-24: 0.5 mg via ORAL
  Filled 2023-12-24: qty 1

## 2023-12-24 NOTE — ED Notes (Signed)
 Pt unhooked at this time to use the restroom. Pt using walker to get to restroom.

## 2023-12-24 NOTE — ED Triage Notes (Signed)
 Pt reports tonight she developed tightness in her neck and HTN, pt reports 200/100. Pt reports hx HTN, takes meds has not missed any doses. Pt denies chest pain.

## 2023-12-24 NOTE — ED Notes (Signed)
 Pt hooked back up to monitor. Calling within reach. No further needs at this time.  Fall risk bundle in place.

## 2023-12-24 NOTE — ED Provider Notes (Signed)
 [facility] Provider Note    Event Date/Time   First MD Initiated Contact with Patient 12/24/23 2031     (approximate)  History   Chief Complaint: Hypertension  HPI  [name] Woods is a 83 y.o. female with a past medical history of anxiety, hypertension, gastric reflux, presents to the emergency department for high blood pressure.  According to the patient she states she developed some tightness sensation in her left upper chest into the neck she checked her blood pressure and it was elevated 200/100.  Patient was very worried so she came to the emergency department for evaluation.  Patient states she took an extra dose of her losartan  prior to coming into the emergency department.  Patient states this has happened in the past but never as high as 200.  Patient's blood pressure in triage was 216/86.  Patient is somewhat anxious appearing during examination.  Physical Exam   Triage Vital Signs: ED Triage Vitals  Encounter Vitals Group     BP 12/24/23 1925 (!) 216/86     Girls Systolic BP Percentile --      Girls Diastolic BP Percentile --      Boys Systolic BP Percentile --      Boys Diastolic BP Percentile --      Pulse Rate 12/24/23 1925 73     Resp 12/24/23 1925 18     Temp 12/24/23 1925 98.4 F (36.9 C)     Temp src --      SpO2 12/24/23 1925 97 %     Weight 12/24/23 1924 140 lb (63.5 kg)     Height 12/24/23 1924 5' 1 (1.549 m)     Head Circumference --      Peak Flow --      Pain Score 12/24/23 1924 4     Pain Loc --      Pain Education --      Exclude from Growth Chart --     Most recent vital signs: Vitals:   12/24/23 1925  BP: (!) 216/86  Pulse: 73  Resp: 18  Temp: 98.4 F (36.9 C)  SpO2: 97%    General: Awake, no distress.  Mildly anxious appearing. CV:  Good peripheral perfusion.  Regular rate and rhythm  Resp:  Normal effort.  Equal breath sounds bilaterally.  Abd:  No distention.     ED Results / Procedures /  Treatments   EKG  EKG viewed and interpreted by myself shows a normal sinus rhythm at 72 bpm with a narrow QRS, normal axis, normal intervals, no concerning ST changes  RADIOLOGY  I have reviewed and interpreted chest x-ray images.  No consolidation on my evaluation. Radiology has read the x-ray as negative   MEDICATIONS ORDERED IN ED: Medications - No data to display   IMPRESSION / MDM / ASSESSMENT AND PLAN / ED COURSE  I reviewed the triage vital signs and the nursing notes.  Patient's presentation is most consistent with acute presentation with potential threat to life or bodily function.  Patient presents to the emergency department for hypertension and chest/neck discomfort.  Overall the patient appears well somewhat anxious appearing.  Patient's lab work is reassuring with a normal CBC normal chemistry negative troponin.  Chest x-ray is clear and EKG reassuring.  Patient is somewhat anxious appearing.  Blood pressure is trending down was 216 in triage currently 176 during my evaluation.  Will dose a very small amount of oral Ativan  and monitor patient's  blood pressure to ensure that it begins coming down.  Otherwise patient appears well and I believe she would be safe for outpatient follow-up.  Patient's labs are assuring troponin negative x 2 CBC and chemistry are normal.  Patient's blood pressure is downtrending 154/76.  Will discharge the patient home with outpatient follow-up.  FINAL CLINICAL IMPRESSION(S) / ED DIAGNOSES   Hypertension    Note:  This document was prepared using Dragon voice recognition software and may include unintentional dictation errors.   Dorothyann Drivers, MD 12/24/23 2235

## 2023-12-26 DIAGNOSIS — M79671 Pain in right foot: Secondary | ICD-10-CM | POA: Diagnosis not present

## 2024-01-06 DIAGNOSIS — R278 Other lack of coordination: Secondary | ICD-10-CM | POA: Diagnosis not present

## 2024-01-06 DIAGNOSIS — R2689 Other abnormalities of gait and mobility: Secondary | ICD-10-CM | POA: Diagnosis not present

## 2024-01-06 DIAGNOSIS — M6281 Muscle weakness (generalized): Secondary | ICD-10-CM | POA: Diagnosis not present

## 2024-01-23 DIAGNOSIS — M79671 Pain in right foot: Secondary | ICD-10-CM | POA: Diagnosis not present

## 2024-02-06 ENCOUNTER — Encounter: Payer: Self-pay | Admitting: Cardiovascular Disease

## 2024-02-07 DIAGNOSIS — R278 Other lack of coordination: Secondary | ICD-10-CM | POA: Diagnosis not present

## 2024-02-07 DIAGNOSIS — R2689 Other abnormalities of gait and mobility: Secondary | ICD-10-CM | POA: Diagnosis not present

## 2024-02-07 DIAGNOSIS — G609 Hereditary and idiopathic neuropathy, unspecified: Secondary | ICD-10-CM | POA: Diagnosis not present

## 2024-02-07 DIAGNOSIS — M6281 Muscle weakness (generalized): Secondary | ICD-10-CM | POA: Diagnosis not present

## 2024-02-10 MED ORDER — LOSARTAN POTASSIUM 50 MG PO TABS: 50.0000 mg | ORAL_TABLET | Freq: Every day | ORAL | 3 refills | Status: AC

## 2024-03-13 MED ORDER — CLONIDINE HCL 0.1 MG PO TABS: 0.1000 mg | ORAL_TABLET | Freq: Two times a day (BID) | ORAL | 11 refills | Status: AC | PRN

## 2024-03-13 NOTE — Telephone Encounter (Signed)
 Called patient and notified her of the following from Dr. Gollan.  If an emergency pill needed -Amlodipine  could be taken sparingly as needed to bring the number down, this would not cause leg swelling as it is not amlodipine  every day -Alternatively hydralazine  could be taken as needed, only for high pressures but not every day -If a different emergency blood pressure pill is needed, we could try clonidine 0.1 twice daily as needed for pressure over 160 She could take the clonidine into the evening if pressure runs high Thx TGollan  Patient states that she would like to take Clonidine as needed. Prescription sent to preferred pharmacy.

## 2024-03-13 NOTE — Addendum Note (Signed)
 Addended by: CRISTOPHER OLIVIA PARAS on: 03/13/2024 02:08 PM   Modules accepted: Orders

## 2024-03-20 ENCOUNTER — Ambulatory Visit: Admitting: Nurse Practitioner

## 2024-03-26 DIAGNOSIS — L814 Other melanin hyperpigmentation: Secondary | ICD-10-CM | POA: Diagnosis not present

## 2024-03-26 DIAGNOSIS — L820 Inflamed seborrheic keratosis: Secondary | ICD-10-CM | POA: Diagnosis not present

## 2024-03-26 DIAGNOSIS — L821 Other seborrheic keratosis: Secondary | ICD-10-CM | POA: Diagnosis not present

## 2024-04-28 ENCOUNTER — Other Ambulatory Visit: Payer: Self-pay | Admitting: Internal Medicine

## 2024-04-28 DIAGNOSIS — Z1231 Encounter for screening mammogram for malignant neoplasm of breast: Secondary | ICD-10-CM

## 2024-05-25 ENCOUNTER — Ambulatory Visit
Admission: RE | Admit: 2024-05-25 | Discharge: 2024-05-25 | Disposition: A | Source: Ambulatory Visit | Attending: Internal Medicine | Admitting: Internal Medicine

## 2024-05-25 DIAGNOSIS — Z1231 Encounter for screening mammogram for malignant neoplasm of breast: Secondary | ICD-10-CM | POA: Diagnosis present

## 2024-06-08 ENCOUNTER — Other Ambulatory Visit: Payer: Self-pay

## 2024-06-11 MED ORDER — EZETIMIBE 10 MG PO TABS: 10.0000 mg | ORAL_TABLET | Freq: Every day | ORAL | 0 refills | Status: AC

## 2024-06-11 NOTE — Telephone Encounter (Signed)
 Labs on 12/12/23

## 2024-07-07 ENCOUNTER — Ambulatory Visit: Admitting: Cardiovascular Disease
